# Patient Record
Sex: Female | Born: 1935 | Race: White | Hispanic: No | Marital: Married | Smoking: Former smoker
Health system: Southern US, Community
[De-identification: ages and names within clinical notes are randomized; demographics above are authoritative.]

## PROBLEM LIST (undated history)

## (undated) DIAGNOSIS — J45909 Unspecified asthma, uncomplicated: Secondary | ICD-10-CM

## (undated) DIAGNOSIS — C801 Malignant (primary) neoplasm, unspecified: Secondary | ICD-10-CM

## (undated) DIAGNOSIS — D649 Anemia, unspecified: Secondary | ICD-10-CM

## (undated) DIAGNOSIS — K8020 Calculus of gallbladder without cholecystitis without obstruction: Secondary | ICD-10-CM

## (undated) DIAGNOSIS — M1990 Unspecified osteoarthritis, unspecified site: Secondary | ICD-10-CM

## (undated) DIAGNOSIS — I10 Essential (primary) hypertension: Secondary | ICD-10-CM

## (undated) DIAGNOSIS — N183 Chronic kidney disease, stage 3 unspecified: Secondary | ICD-10-CM

## (undated) DIAGNOSIS — K7460 Unspecified cirrhosis of liver: Secondary | ICD-10-CM

## (undated) DIAGNOSIS — T8092XA Unspecified transfusion reaction, initial encounter: Secondary | ICD-10-CM

## (undated) DIAGNOSIS — K766 Portal hypertension: Secondary | ICD-10-CM

## (undated) DIAGNOSIS — F32A Depression, unspecified: Secondary | ICD-10-CM

## (undated) DIAGNOSIS — F329 Major depressive disorder, single episode, unspecified: Secondary | ICD-10-CM

## (undated) HISTORY — DX: Portal hypertension: K76.6

## (undated) HISTORY — DX: Anemia, unspecified: D64.9

## (undated) HISTORY — DX: Unspecified osteoarthritis, unspecified site: M19.90

## (undated) HISTORY — DX: Unspecified transfusion reaction, initial encounter: T80.92XA

## (undated) HISTORY — DX: Depression, unspecified: F32.A

## (undated) HISTORY — DX: Essential (primary) hypertension: I10

## (undated) HISTORY — DX: Major depressive disorder, single episode, unspecified: F32.9

## (undated) HISTORY — DX: Malignant (primary) neoplasm, unspecified: C80.1

## (undated) HISTORY — DX: Unspecified asthma, uncomplicated: J45.909

---

## 2004-07-27 ENCOUNTER — Ambulatory Visit: Payer: Self-pay | Admitting: Physician Assistant

## 2004-08-29 ENCOUNTER — Ambulatory Visit: Payer: Self-pay | Admitting: Physician Assistant

## 2004-09-19 ENCOUNTER — Ambulatory Visit: Payer: Self-pay | Admitting: Pain Medicine

## 2004-09-26 ENCOUNTER — Ambulatory Visit: Payer: Self-pay | Admitting: Pain Medicine

## 2004-10-12 ENCOUNTER — Ambulatory Visit: Payer: Self-pay | Admitting: Pain Medicine

## 2004-10-23 ENCOUNTER — Emergency Department: Payer: Self-pay | Admitting: General Practice

## 2004-10-31 ENCOUNTER — Ambulatory Visit: Payer: Self-pay | Admitting: Pain Medicine

## 2004-11-23 ENCOUNTER — Ambulatory Visit: Payer: Self-pay | Admitting: Pain Medicine

## 2004-12-27 ENCOUNTER — Ambulatory Visit: Payer: Self-pay | Admitting: Physician Assistant

## 2005-01-24 ENCOUNTER — Ambulatory Visit: Payer: Self-pay | Admitting: Physician Assistant

## 2005-03-13 ENCOUNTER — Ambulatory Visit: Payer: Self-pay | Admitting: Physician Assistant

## 2005-04-10 ENCOUNTER — Ambulatory Visit: Payer: Self-pay | Admitting: Physician Assistant

## 2005-04-17 ENCOUNTER — Ambulatory Visit: Payer: Self-pay | Admitting: Pain Medicine

## 2005-04-19 ENCOUNTER — Ambulatory Visit: Payer: Self-pay | Admitting: Pain Medicine

## 2005-05-10 ENCOUNTER — Ambulatory Visit: Payer: Self-pay | Admitting: Physician Assistant

## 2005-06-08 ENCOUNTER — Ambulatory Visit: Payer: Self-pay | Admitting: Physician Assistant

## 2005-07-05 ENCOUNTER — Ambulatory Visit: Payer: Self-pay | Admitting: Physician Assistant

## 2005-08-13 ENCOUNTER — Ambulatory Visit: Payer: Self-pay | Admitting: Physician Assistant

## 2005-09-10 ENCOUNTER — Ambulatory Visit: Payer: Self-pay | Admitting: Physician Assistant

## 2005-10-12 ENCOUNTER — Ambulatory Visit: Payer: Self-pay | Admitting: Physician Assistant

## 2005-11-06 ENCOUNTER — Ambulatory Visit: Payer: Self-pay | Admitting: Physician Assistant

## 2005-12-10 ENCOUNTER — Ambulatory Visit: Payer: Self-pay | Admitting: Physician Assistant

## 2006-01-08 ENCOUNTER — Ambulatory Visit: Payer: Self-pay | Admitting: Physician Assistant

## 2006-02-05 ENCOUNTER — Ambulatory Visit: Payer: Self-pay | Admitting: Physician Assistant

## 2006-03-07 ENCOUNTER — Ambulatory Visit: Payer: Self-pay | Admitting: Physician Assistant

## 2006-04-04 ENCOUNTER — Ambulatory Visit: Payer: Self-pay | Admitting: Physician Assistant

## 2006-04-15 ENCOUNTER — Ambulatory Visit: Payer: Self-pay | Admitting: Pain Medicine

## 2006-05-07 ENCOUNTER — Ambulatory Visit: Payer: Self-pay | Admitting: Physician Assistant

## 2006-06-10 ENCOUNTER — Ambulatory Visit: Payer: Self-pay | Admitting: Physician Assistant

## 2006-07-11 ENCOUNTER — Ambulatory Visit: Payer: Self-pay | Admitting: Physician Assistant

## 2006-08-09 ENCOUNTER — Ambulatory Visit: Payer: Self-pay | Admitting: Physician Assistant

## 2006-09-10 ENCOUNTER — Ambulatory Visit: Payer: Self-pay | Admitting: Physician Assistant

## 2006-10-09 ENCOUNTER — Ambulatory Visit: Payer: Self-pay | Admitting: Physician Assistant

## 2006-10-10 ENCOUNTER — Ambulatory Visit: Payer: Self-pay | Admitting: Pain Medicine

## 2006-11-07 ENCOUNTER — Ambulatory Visit: Payer: Self-pay | Admitting: Physician Assistant

## 2006-12-09 ENCOUNTER — Ambulatory Visit: Payer: Self-pay | Admitting: Physician Assistant

## 2007-01-07 ENCOUNTER — Ambulatory Visit: Payer: Self-pay | Admitting: Physician Assistant

## 2007-02-05 ENCOUNTER — Ambulatory Visit: Payer: Self-pay | Admitting: Physician Assistant

## 2007-03-10 ENCOUNTER — Ambulatory Visit: Payer: Self-pay | Admitting: Physician Assistant

## 2007-04-10 ENCOUNTER — Ambulatory Visit: Payer: Self-pay | Admitting: Physician Assistant

## 2007-05-14 ENCOUNTER — Ambulatory Visit: Payer: Self-pay | Admitting: Physician Assistant

## 2007-06-12 ENCOUNTER — Ambulatory Visit: Payer: Self-pay | Admitting: Physician Assistant

## 2007-07-08 ENCOUNTER — Ambulatory Visit: Payer: Self-pay | Admitting: Physician Assistant

## 2010-01-11 ENCOUNTER — Ambulatory Visit: Payer: Self-pay | Admitting: Internal Medicine

## 2010-10-01 DIAGNOSIS — D649 Anemia, unspecified: Secondary | ICD-10-CM

## 2010-10-01 HISTORY — DX: Anemia, unspecified: D64.9

## 2010-10-01 HISTORY — PX: COLONOSCOPY: SHX174

## 2010-11-28 ENCOUNTER — Inpatient Hospital Stay: Payer: Self-pay | Admitting: Internal Medicine

## 2010-12-01 LAB — HM COLONOSCOPY

## 2010-12-05 LAB — PATHOLOGY REPORT

## 2011-01-23 ENCOUNTER — Ambulatory Visit: Payer: Self-pay | Admitting: Internal Medicine

## 2011-01-26 ENCOUNTER — Ambulatory Visit: Payer: Self-pay | Admitting: Internal Medicine

## 2011-05-28 ENCOUNTER — Telehealth: Payer: Self-pay | Admitting: Internal Medicine

## 2011-05-28 NOTE — Telephone Encounter (Signed)
Patient has cancelled several times. Britta Mccreedy (scheduler) with vein center wants to see if doctor would like her to be rescheduled. The patient's family has called and wants to have her seen.

## 2011-05-29 NOTE — Telephone Encounter (Signed)
Lets wait until she comes to see me,  i DON'T WANT HER TO KEEP WAISTING THEIR TIME

## 2011-05-29 NOTE — Telephone Encounter (Signed)
Barbara from [facility] called and stated patient has cancelled several times with them and they want to know if you want her to be rescheduled again.  Please advise

## 2011-05-30 NOTE — Telephone Encounter (Signed)
Notified [facility] wait until patient sees Dr. Darrick Huntsman, so that she is not waisting their time.

## 2011-06-05 ENCOUNTER — Other Ambulatory Visit: Payer: Self-pay | Admitting: Internal Medicine

## 2011-06-05 MED ORDER — ACETAMINOPHEN-CODEINE 300-30 MG PO TABS: 1.0000 | ORAL_TABLET | Freq: Two times a day (BID) | ORAL | Status: DC | PRN

## 2011-07-02 ENCOUNTER — Other Ambulatory Visit: Payer: Self-pay | Admitting: Internal Medicine

## 2011-07-02 MED ORDER — FERROUS SULFATE 324 (65 FE) MG PO TBEC: 1.0000 | DELAYED_RELEASE_TABLET | Freq: Two times a day (BID) | ORAL | Status: DC

## 2011-07-09 ENCOUNTER — Other Ambulatory Visit: Payer: Self-pay | Admitting: Internal Medicine

## 2011-07-10 MED ORDER — INSULIN DETEMIR 100 UNIT/ML ~~LOC~~ SOLN: 20.0000 [IU] | Freq: Every day | SUBCUTANEOUS | Status: DC

## 2011-07-13 ENCOUNTER — Telehealth: Payer: Self-pay | Admitting: Internal Medicine

## 2011-07-13 DIAGNOSIS — Z1239 Encounter for other screening for malignant neoplasm of breast: Secondary | ICD-10-CM

## 2011-07-13 NOTE — Telephone Encounter (Signed)
PT CALL SHE RECEIVED LETTER  SHE NEEDS ORDER FOR 6 MONTH FOLLOW UP MAMMOGRAM @ [facility]

## 2011-07-13 NOTE — Telephone Encounter (Signed)
Ordered in epic

## 2011-07-17 NOTE — Telephone Encounter (Signed)
Called pt told her that i was faxing order over and she could call and make appointment

## 2011-07-20 ENCOUNTER — Telehealth: Payer: Self-pay | Admitting: *Deleted

## 2011-07-20 DIAGNOSIS — R928 Other abnormal and inconclusive findings on diagnostic imaging of breast: Secondary | ICD-10-CM

## 2011-07-20 NOTE — Telephone Encounter (Signed)
New order needed, done.

## 2011-08-06 ENCOUNTER — Telehealth: Payer: Self-pay | Admitting: Internal Medicine

## 2011-08-06 NOTE — Telephone Encounter (Signed)
Order signed for [facility].

## 2011-08-06 NOTE — Telephone Encounter (Signed)
Order faxed.

## 2011-08-08 ENCOUNTER — Ambulatory Visit: Payer: Self-pay | Admitting: Internal Medicine

## 2011-08-09 ENCOUNTER — Telehealth: Payer: Self-pay | Admitting: Internal Medicine

## 2011-08-09 DIAGNOSIS — R928 Other abnormal and inconclusive findings on diagnostic imaging of breast: Secondary | ICD-10-CM

## 2011-08-09 NOTE — Telephone Encounter (Signed)
Please let Becky Roberts know that her mammogram shows a density in the right breast that needs to be biopsied.  I would like to get her scheduled ASAP with a surgeon.  Does she have a preference who I send her to? If not,  Please we will call the cancer Center first.

## 2011-08-09 NOTE — Telephone Encounter (Signed)
Notified patient of message, she does not have a preference so I will send the referral to [facility] to set patient up at the cancer center.

## 2011-08-10 ENCOUNTER — Telehealth: Payer: Self-pay | Admitting: Internal Medicine

## 2011-08-10 ENCOUNTER — Ambulatory Visit: Payer: Self-pay | Admitting: Oncology

## 2011-08-10 DIAGNOSIS — N6310 Unspecified lump in the right breast, unspecified quadrant: Secondary | ICD-10-CM

## 2011-08-10 NOTE — Telephone Encounter (Signed)
Yes  I will place a referral to [facility].  Please call their office. tanks

## 2011-08-10 NOTE — Telephone Encounter (Signed)
She called in and states that Dr. Nicholaus Corolla wants patient referred to a surgeon, for Right breast mass for biopsy the family chose [facility] [phone] either Dr. Lemar Livings or [facility]. She states that they called over to get the patient appointment however they request the referral comes from the PCP, since patient has medicaid.  Please advise, we may call   Marchelle Folks is the granddaughter [phone] with appointment.

## 2011-08-15 ENCOUNTER — Other Ambulatory Visit: Payer: Self-pay | Admitting: Internal Medicine

## 2011-08-15 MED ORDER — ERGOCALCIFEROL 1.25 MG (50000 UT) PO CAPS: 50000.0000 [IU] | ORAL_CAPSULE | ORAL | Status: DC

## 2011-08-15 NOTE — Telephone Encounter (Signed)
Becky Roberts,  Has this patient received an appt with Dr. Lemar Livings for her breast biopsy?  See below.

## 2011-08-16 NOTE — Telephone Encounter (Signed)
Patient has an appointment with Dr. Lemar Livings on Tuesday Nov. 27, 12 she needs to arrive at 10:45 for an 11:15 appointment, this was their soonest appointment due to holidays.  I gave all the information of appointment heir number and address. to son Scotty Mabe, and also advised him that he needed to call Medicaid and get [facility] off of his mothers card.

## 2011-09-01 ENCOUNTER — Ambulatory Visit: Payer: Self-pay | Admitting: Oncology

## 2011-12-04 ENCOUNTER — Other Ambulatory Visit: Payer: Self-pay | Admitting: Internal Medicine

## 2011-12-05 MED ORDER — GLIPIZIDE 10 MG PO TABS: 10.0000 mg | ORAL_TABLET | Freq: Two times a day (BID) | ORAL | Status: DC

## 2012-01-11 ENCOUNTER — Ambulatory Visit: Payer: Medicare Other | Admitting: Internal Medicine

## 2012-02-07 ENCOUNTER — Ambulatory Visit: Payer: Self-pay | Admitting: General Surgery

## 2012-02-11 ENCOUNTER — Other Ambulatory Visit: Payer: Self-pay | Admitting: Internal Medicine

## 2012-02-11 MED ORDER — "SYRINGE/NEEDLE (DISP) 25G X 5/8"" 3 ML MISC": Status: DC

## 2012-02-11 MED ORDER — ACETAMINOPHEN-CODEINE 300-30 MG PO TABS: 1.0000 | ORAL_TABLET | Freq: Two times a day (BID) | ORAL | Status: DC | PRN

## 2012-02-12 ENCOUNTER — Ambulatory Visit: Payer: Self-pay | Admitting: General Surgery

## 2012-03-05 ENCOUNTER — Encounter: Payer: Self-pay | Admitting: Internal Medicine

## 2012-03-05 DIAGNOSIS — C50919 Malignant neoplasm of unspecified site of unspecified female breast: Secondary | ICD-10-CM | POA: Insufficient documentation

## 2012-03-06 ENCOUNTER — Ambulatory Visit (INDEPENDENT_AMBULATORY_CARE_PROVIDER_SITE_OTHER): Payer: Medicare Other | Admitting: Internal Medicine

## 2012-03-06 ENCOUNTER — Telehealth: Payer: Self-pay | Admitting: Internal Medicine

## 2012-03-06 ENCOUNTER — Encounter: Payer: Self-pay | Admitting: Internal Medicine

## 2012-03-06 VITALS — BP 140/80 | HR 96 | Temp 98.0°F | Ht 62.5 in | Wt 183.8 lb

## 2012-03-06 DIAGNOSIS — R079 Chest pain, unspecified: Secondary | ICD-10-CM

## 2012-03-06 DIAGNOSIS — E1165 Type 2 diabetes mellitus with hyperglycemia: Secondary | ICD-10-CM

## 2012-03-06 DIAGNOSIS — D649 Anemia, unspecified: Secondary | ICD-10-CM

## 2012-03-06 DIAGNOSIS — E1129 Type 2 diabetes mellitus with other diabetic kidney complication: Secondary | ICD-10-CM

## 2012-03-06 DIAGNOSIS — N058 Unspecified nephritic syndrome with other morphologic changes: Secondary | ICD-10-CM

## 2012-03-06 DIAGNOSIS — R0609 Other forms of dyspnea: Secondary | ICD-10-CM | POA: Insufficient documentation

## 2012-03-06 DIAGNOSIS — E46 Unspecified protein-calorie malnutrition: Secondary | ICD-10-CM

## 2012-03-06 DIAGNOSIS — M6281 Muscle weakness (generalized): Secondary | ICD-10-CM | POA: Insufficient documentation

## 2012-03-06 DIAGNOSIS — R011 Cardiac murmur, unspecified: Secondary | ICD-10-CM

## 2012-03-06 DIAGNOSIS — R635 Abnormal weight gain: Secondary | ICD-10-CM

## 2012-03-06 DIAGNOSIS — IMO0001 Reserved for inherently not codable concepts without codable children: Secondary | ICD-10-CM

## 2012-03-06 DIAGNOSIS — C50919 Malignant neoplasm of unspecified site of unspecified female breast: Secondary | ICD-10-CM

## 2012-03-06 DIAGNOSIS — R0600 Dyspnea, unspecified: Secondary | ICD-10-CM

## 2012-03-06 DIAGNOSIS — R0989 Other specified symptoms and signs involving the circulatory and respiratory systems: Secondary | ICD-10-CM

## 2012-03-06 DIAGNOSIS — E1121 Type 2 diabetes mellitus with diabetic nephropathy: Secondary | ICD-10-CM | POA: Insufficient documentation

## 2012-03-06 LAB — CBC WITH DIFFERENTIAL/PLATELET
Basophils Relative: 0.7 % (ref 0.0–3.0)
Eosinophils Absolute: 0.1 10*3/uL (ref 0.0–0.7)
MCHC: 33.5 g/dL (ref 30.0–36.0)
MCV: 99.4 fl (ref 78.0–100.0)
Monocytes Absolute: 0.5 10*3/uL (ref 0.1–1.0)
Neutrophils Relative %: 56 % (ref 43.0–77.0)
Platelets: 140 10*3/uL — ABNORMAL LOW (ref 150.0–400.0)
RBC: 3.29 Mil/uL — ABNORMAL LOW (ref 3.87–5.11)

## 2012-03-06 LAB — FERRITIN: Ferritin: 36.4 ng/mL (ref 10.0–291.0)

## 2012-03-06 LAB — TSH: TSH: 2.46 u[IU]/mL (ref 0.35–5.50)

## 2012-03-06 LAB — BRAIN NATRIURETIC PEPTIDE: Pro B Natriuretic peptide (BNP): 30 pg/mL (ref 0.0–100.0)

## 2012-03-06 LAB — MICROALBUMIN / CREATININE URINE RATIO: Microalb, Ur: 1.2 mg/dL (ref 0.0–1.9)

## 2012-03-06 MED ORDER — ACETAMINOPHEN-CODEINE 300-30 MG PO TABS: 1.0000 | ORAL_TABLET | Freq: Three times a day (TID) | ORAL | Status: DC | PRN

## 2012-03-06 MED ORDER — "SYRINGE/NEEDLE (DISP) 25G X 5/8"" 3 ML MISC": Status: DC

## 2012-03-06 MED ORDER — "SYRINGE LUER LOCK 25G X 5/8"" 3 ML MISC": 1.0000 "application " | Status: DC

## 2012-03-06 NOTE — Progress Notes (Signed)
Patient ID: Becky Roberts, female   DOB: 05-05-1932, 78 y.o.   MRN: [id number]  Patient Active Problem List  Diagnoses  . Breast cancer  . Anemia  . Generalized muscle weakness  . Diabetes mellitus with nephropathy  . Chest pain on exertion  . Dyspnea on effort    Subjective:  CC:   Chief Complaint  Patient presents with  . Follow-up    HPI:   Becky Mansfieldis a 78 y.o. female who presents  For a preoperative evaluation.  She is 78 yr old female with a history  of diabetes mellitus, protein malnutrition, pernicious anemia, generalized anxiety disorder, tobacco abuse, Chronic kidney disease who has been lost to follow up for one year.  She has been recently diagnosed with breast cancer and presents for preoperative evaluation for breast conservation surgery which is planned for next week by Adela Glimpse. Her general health is poor.  I have only seen her 2 or three times. She was  Admitted to [facility] shortly after her initial visit from my office in May 2012 with symptomatic anemia of uncertain etiology.  B12 deficiency, protein malnutrition and chronic kidney disease secondary to diabetes were diagnosed, and she as offered short term rehab at discharge but refused. She has been in decline since hospitalization. She moved out of daughter's home last week and into her own trailer.  Diet has been poor for the past year since discharge, despite being malnourished and living with daughter, who does not cook,  Son has been giving her the B12 injections monthly.  She has had no follow up on diabetes, hyperlipidemia, anemia and CKD since May 2012.  She has had some SSCP which she has attributed to gas, which occurs both at rest and with ambulation, and reports dyspnea with exertion and trouble ambulating due to generalized weakness." Like I did when I had no blood. "    Checks blood sugar rarely ,  Sugars range from "149 to 2-- something", with occasional hypoglycemic episodes occurring when she  sleeps through the day and sometimes in the middle of the night.  Treats those episodes with  Pepsi.  Has transportation issues, doesnot hve a functioning car bc daughter wrecked her car.  uses walker, lives alone  children busy working.  Too exhausted to go to grocery store.  Had an adenosine myoview.at [facility] years ago which she apparently had a bad reaction to and was prescribed  Plavix bc she has an allergy to aspirin .She has a remote tobacco history in her 20's, but  none in years.  ,  Past Medical History  Diagnosis Date  . Asthma   . Arthritis   . Depression   . Diabetes mellitus   . Hypertension   . Blood transfusion reaction   . Anemia 2012    hgb 5.5, admitted,  workup multifactorial,  b12 deficiency    Past Surgical History  Procedure Date  . Breast biopsy          The following portions of the patient's history were reviewed and updated as appropriate: Allergies, current medications, and problem list.    Review of Systems:   12 Pt  review of systems was negative except those addressed in the HPI,     History   Social History  . Marital Status: Unknown    Spouse Name: N/A    Number of Children: N/A  . Years of Education: N/A   Occupational History  . Not on file.   Social History Main  Topics  . Smoking status: Former Games developer  . Smokeless tobacco: Not on file  . Alcohol Use: Not on file  . Drug Use: Not on file  . Sexually Active: Not on file   Other Topics Concern  . Not on file   Social History Narrative  . No narrative on file    Objective:  BP 140/80  Pulse 96  Temp(Src) 98 F (36.7 C) (Oral)  Ht 5' 2.5" (1.588 m)  Wt 183 lb 12 oz (83.348 kg)  BMI 33.07 kg/m2  SpO2 98%  General appearance: alert, cooperative and appears stated age Ears: normal TM's and external ear canals both ears Throat: lips, mucosa, and tongue normal; teeth and gums normal Neck: no adenopathy, no carotid bruit, supple, symmetrical, trachea midline and thyroid not  enlarged, symmetric, no tenderness/mass/nodules Back: symmetric, no curvature. ROM normal. No CVA tenderness. Lungs: clear to auscultation bilaterally Heart: regular rate and rhythm, S1, S2 normal, no murmur, click, rub or gallop Abdomen: soft, non-tender; bowel sounds normal; no masses,  no organomegaly Pulses: 2+ and symmetric Skin: Skin color, texture, turgor normal. No rashes or lesions Lymph nodes: Cervical, supraclavicular, and axillary nodes normal.  Assessment and Plan:  Breast cancer Right breast,  With breast conservation therapy preferred by patient.  Surgeyr tentatively planned for next week, but her preoperative evaluation raises many concerns about cardiomyopathy vs CAD.  Will try to get ECHO scheduled ASAP to further identify any issues.   Anemia multifactorial  with May 2012 admission for transfusion of 4 units and GI evaluation. 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  Serologic evaluation ruled out multiple myeloma.  Instrisic Factor ab negative and iron deficiency found.  Continue B12 injections and check iron stores.   Diabetes mellitus with nephropathy Urine microalbumin and hgba1c is due   Dyspnea on effort Given her hgb of 11.o, etiology includes CAD, cardiomyopathy, pulmonary hypertension, and severe deconditioning.  ECHO ordered,  EKG is sinus rhythm.  Generalized muscle weakness Checking ck,  Will add TSH if possible. Will need home PT to improve baseline.  She will need a multidicplinary approach to deliver optimal care. [facility] referral in process.  Chest pain on exertion Her EKG is normal appearing but she has multiple risk factors for CAD.  Will assess with ECHO for preoperative evaluation.     Updated Medication List Outpatient Encounter Prescriptions as of 03/06/2012  Medication Sig Dispense Refill  . Acetaminophen-Codeine (TYLENOL/CODEINE #3) 300-30 MG per tablet Take 1 tablet by mouth 3 (three) times daily as needed for pain.  90 tablet  3  .  clopidogrel (PLAVIX) 75 MG tablet Take 75 mg by mouth daily.      . cyanocobalamin (,VITAMIN B-12,) 1000 MCG/ML injection Inject 1,000 mcg into the muscle every 30 (thirty) days.      . diazepam (VALIUM) 5 MG tablet Take 5 mg by mouth 3 (three) times daily.      . ergocalciferol (VITAMIN D2) 50000 UNITS capsule Take 1 capsule (50,000 Units total) by mouth once a week.  4 capsule  5  . ferrous sulfate 324 (65 FE) MG TBEC Take 1 tablet by mouth 2 (two) times daily with a meal.  60 tablet  5  . glipiZIDE (GLUCOTROL) 10 MG tablet Take 1 tablet (10 mg total) by mouth 2 (two) times daily before a meal.  60 tablet  3  . insulin detemir (LEVEMIR) 100 UNIT/ML injection Inject 20 Units into the skin daily.  10 mL  3  .  lisinopril (PRINIVIL,ZESTRIL) 10 MG tablet Take 10 mg by mouth daily.      . metoCLOPramide (REGLAN) 10 MG tablet Take one tablet by mouth 30 minutes before meals      . omeprazole (PRILOSEC) 20 MG capsule Take 20 mg by mouth daily.      . simvastatin (ZOCOR) 40 MG tablet Take 40 mg by mouth daily.      . SYRINGE-NEEDLE, DISP, 3 ML (B-D SYRINGE/NEEDLE 3CC/25GX5/8) 25G X 5/8" 3 ML MISC Use once a month  50 each  5  . DISCONTD: Acetaminophen-Codeine (TYLENOL/CODEINE #3) 300-30 MG per tablet Take 1 tablet by mouth 2 (two) times daily as needed for pain.  60 tablet  3  . DISCONTD: SYRINGE-NEEDLE, DISP, 3 ML (B-D SYRINGE/NEEDLE 3CC/25GX5/8) 25G X 5/8" 3 ML MISC Use once a month  50 each  5  . Syringe/Needle, Disp, (SYRINGE LUER LOCK) 25G X 5/8" 3 ML MISC 1 application by Does not apply route every 30 (thirty) days.  50 each  0     Orders Placed This Encounter  Procedures  . TSH  . COMPLETE METABOLIC PANEL WITH GFR  . Iron and TIBC  . Ferritin  . Microalbumin / creatinine urine ratio  . CBC with Differential  . CK  . B Nat Peptide  . Magnesium  . Hemoglobin A1c  . Ambulatory referral to Home Health  . 2D Echocardiogram without contrast  . Electrocardiogram report    No Follow-up on  file.      '

## 2012-03-06 NOTE — Assessment & Plan Note (Signed)
Given her hgb of 11.o, etiology includes CAD, cardiomyopathy, pulmonary hypertension, and severe deconditioning.  ECHO ordered,  EKG is sinus rhythm.

## 2012-03-06 NOTE — Telephone Encounter (Signed)
Appointment for echo @ [facility]  Appointment @ 12:30 need to arrive @ 29 Called spoke with judy dr Darrick Huntsman wanted [place] dr to read and also wanted call report Pt aware of appointment

## 2012-03-06 NOTE — Assessment & Plan Note (Signed)
Checking ck,  Will add TSH if possible. Will need home PT to improve baseline.  She will need a multidicplinary approach to deliver optimal care. [facility] referral in process.

## 2012-03-06 NOTE — Patient Instructions (Signed)
I have scheduled you for an echocardiogram of your heart tomorrow June 7th, at the hospital.  [facility] and tell them you are there to get an ECHO of your heart.

## 2012-03-06 NOTE — Telephone Encounter (Signed)
FYI

## 2012-03-06 NOTE — Assessment & Plan Note (Signed)
Right breast,  With breast conservation therapy preferred by patient.  Surgeyr tentatively planned for next week, but her preoperative evaluation raises many concerns about cardiomyopathy vs CAD.  Will try to get ECHO scheduled ASAP to further identify any issues.

## 2012-03-06 NOTE — Assessment & Plan Note (Signed)
Her EKG is normal appearing but she has multiple risk factors for CAD.  Will assess with ECHO for preoperative evaluation.

## 2012-03-06 NOTE — Assessment & Plan Note (Signed)
Urine microalbumin and hgba1c is due

## 2012-03-06 NOTE — Assessment & Plan Note (Signed)
multifactorial  with May 2012 admission for transfusion of 4 units and GI evaluation. 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  Serologic evaluation ruled out multiple myeloma.  Instrisic Factor ab negative and iron deficiency found.  Continue B12 injections and check iron stores.

## 2012-03-07 ENCOUNTER — Other Ambulatory Visit: Payer: Self-pay | Admitting: Internal Medicine

## 2012-03-07 ENCOUNTER — Ambulatory Visit: Payer: Self-pay | Admitting: Internal Medicine

## 2012-03-07 DIAGNOSIS — I517 Cardiomegaly: Secondary | ICD-10-CM

## 2012-03-07 LAB — COMPLETE METABOLIC PANEL WITH GFR
BUN: 11 mg/dL (ref 6–23)
CO2: 25 mEq/L (ref 19–32)
Creat: 1.18 mg/dL — ABNORMAL HIGH (ref 0.50–1.10)
GFR, Est African American: 51 mL/min — ABNORMAL LOW
GFR, Est Non African American: 44 mL/min — ABNORMAL LOW
Glucose, Bld: 142 mg/dL — ABNORMAL HIGH (ref 70–99)
Total Bilirubin: 0.7 mg/dL (ref 0.3–1.2)

## 2012-03-07 LAB — IRON AND TIBC
Iron: 131 ug/dL (ref 42–145)
TIBC: 293 ug/dL (ref 250–470)
UIBC: 162 ug/dL (ref 125–400)

## 2012-03-08 ENCOUNTER — Encounter: Payer: Self-pay | Admitting: Internal Medicine

## 2012-03-08 MED ORDER — ERGOCALCIFEROL 1.25 MG (50000 UT) PO CAPS: 50000.0000 [IU] | ORAL_CAPSULE | ORAL | Status: DC

## 2012-03-10 ENCOUNTER — Telehealth: Payer: Self-pay | Admitting: Internal Medicine

## 2012-03-10 ENCOUNTER — Other Ambulatory Visit: Payer: Self-pay | Admitting: Internal Medicine

## 2012-03-10 MED ORDER — AMOXICILLIN-POT CLAVULANATE 875-125 MG PO TABS: 1.0000 | ORAL_TABLET | Freq: Two times a day (BID) | ORAL | Status: DC

## 2012-03-10 NOTE — Telephone Encounter (Signed)
Patients son notified.  Rx called in.  I also talked to patient and she is aware.

## 2012-03-10 NOTE — Telephone Encounter (Signed)
jher labs are heart ECHO were reviewed.  I will clear her for surgery.  I just found an e mial in her chart from June 8 from son reporting URI symptoms.  Not in my in box ??? If she is having fever or productive cough,  rx augmentin 875/125 one tablet twice daily with food x 7 days.  14 no refills.

## 2012-03-11 ENCOUNTER — Telehealth: Payer: Self-pay | Admitting: Internal Medicine

## 2012-03-11 DIAGNOSIS — R531 Weakness: Secondary | ICD-10-CM

## 2012-03-11 NOTE — Telephone Encounter (Signed)
Michelle from [facility] called in and states that you had sent them a referral for Physical Therapy she states that they don't offer PT and that we need to refer patient to home health.

## 2012-03-12 ENCOUNTER — Encounter: Payer: Self-pay | Admitting: Internal Medicine

## 2012-03-12 NOTE — Telephone Encounter (Signed)
Please call Becky Roberts for [name],  referral in epic

## 2012-03-13 NOTE — Telephone Encounter (Signed)
I have started the referral to [facility].  I will fax once the order has been signed.

## 2012-03-17 ENCOUNTER — Encounter: Payer: Self-pay | Admitting: Internal Medicine

## 2012-03-17 ENCOUNTER — Ambulatory Visit (INDEPENDENT_AMBULATORY_CARE_PROVIDER_SITE_OTHER): Payer: Medicare Other | Admitting: Internal Medicine

## 2012-03-17 VITALS — BP 142/66 | HR 93 | Temp 98.1°F | Resp 16 | Wt 182.5 lb

## 2012-03-17 DIAGNOSIS — J441 Chronic obstructive pulmonary disease with (acute) exacerbation: Secondary | ICD-10-CM

## 2012-03-17 DIAGNOSIS — M6281 Muscle weakness (generalized): Secondary | ICD-10-CM

## 2012-03-17 DIAGNOSIS — N058 Unspecified nephritic syndrome with other morphologic changes: Secondary | ICD-10-CM

## 2012-03-17 DIAGNOSIS — R0989 Other specified symptoms and signs involving the circulatory and respiratory systems: Secondary | ICD-10-CM

## 2012-03-17 DIAGNOSIS — E1121 Type 2 diabetes mellitus with diabetic nephropathy: Secondary | ICD-10-CM

## 2012-03-17 DIAGNOSIS — E1129 Type 2 diabetes mellitus with other diabetic kidney complication: Secondary | ICD-10-CM

## 2012-03-17 MED ORDER — GUAIFENESIN-CODEINE 100-10 MG/5ML PO SYRP: 5.0000 mL | ORAL_SOLUTION | Freq: Three times a day (TID) | ORAL | Status: DC | PRN

## 2012-03-17 MED ORDER — INSULIN DETEMIR 100 UNIT/ML ~~LOC~~ SOLN: 10.0000 [IU] | Freq: Every day | SUBCUTANEOUS | Status: DC

## 2012-03-17 NOTE — Patient Instructions (Signed)
Record your blood sugars twice daily using the booklet I gave you ,  And record what kind of food you eat for each meal.   Continue the glipizide twice daily before breakfast and dinner(evening meal)  Add 10 units of Levemir insulin daily,  In the evening  Take 2 puffs twice a day of the symbicort inhaler  to help your breathing   I will call in a medicine for your cough that has codeine in it.

## 2012-03-17 NOTE — Assessment & Plan Note (Addendum)
Adding levemir  10 units daily for uncontrolled.  Asked to Check sugars twice daily

## 2012-03-17 NOTE — Progress Notes (Signed)
Patient ID: Becky Roberts, female   DOB: Jul 09, 1932, 78 y.o.   MRN: [id number] Patient Active Problem List  Diagnosis  . Breast cancer  . Anemia  . Generalized muscle weakness  . Diabetes mellitus with nephropathy  . Chest pain on exertion  . Dyspnea on effort  . COPD exacerbation    Subjective:  CC:   Chief Complaint  Patient presents with  . Follow-up    HPI:   Becky Mansfieldis a 78 y.o. female who presents for one week follow up on diabetes,  Breast cancer,  Acute COPD exacerbation treated empirically after last visit.  Her breast conservation surgery has been postponed until July 2 due to her respiratory status.  She continues to have some wheezing at night and dry cough.  Her previous subjectively high fevers have now resolved.  Sugars have been labile and recent hgba1c was 8.6.      Past Medical History  Diagnosis Date  . Asthma   . Arthritis   . Depression   . Diabetes mellitus   . Hypertension   . Blood transfusion reaction   . Anemia 2012    hgb 5.5, admitted,  workup multifactorial,  b12 deficiency    Past Surgical History  Procedure Date  . Breast biopsy          The following portions of the patient's history were reviewed and updated as appropriate: Allergies, current medications, and problem list.    Review of Systems:   12 Pt  review of systems was negative except those addressed in the HPI,     History   Social History  . Marital Status: Unknown    Spouse Name: N/A    Number of Children: N/A  . Years of Education: N/A   Occupational History  . Not on file.   Social History Main Topics  . Smoking status: Former Games developer  . Smokeless tobacco: Never Used  . Alcohol Use: No  . Drug Use: No  . Sexually Active: Not on file   Other Topics Concern  . Not on file   Social History Narrative  . No narrative on file    Objective:  BP 142/66  Pulse 93  Temp 98.1 F (36.7 C) (Oral)  Resp 16  Wt 182 lb 8 oz (82.781 kg)   SpO2 97%  General appearance: alert, cooperative and appears stated age Ears: normal TM's and external ear canals both ears Throat: lips, mucosa, and tongue normal; teeth and gums normal Neck: no adenopathy, no carotid bruit, supple, symmetrical, trachea midline and thyroid not enlarged, symmetric, no tenderness/mass/nodules Back: symmetric, no curvature. ROM normal. No CVA tenderness. Lungs: clear to auscultation bilaterally Heart: regular rate and rhythm, S1, S2 normal, no murmur, click, rub or gallop Abdomen: soft, non-tender; bowel sounds normal; no masses,  no organomegaly Pulses: 2+ and symmetric Skin: Skin color, texture, turgor normal. No rashes or lesions Lymph nodes: Cervical, supraclavicular, and axillary nodes normal.  Assessment and Plan:  Diabetes mellitus with nephropathy Adding levemir  10 units daily for uncontrolled.  Asked to Check sugars twice daily   COPD exacerbation Resolving,.  S/p one week of augmentin .  Adding 2 weeks of symbicort and cheratussin   Dyspnea on effort multifactorial secondary to COPD, deconditioning.  No signs of heart failure and ECHO was essentially normal.  Home Health services for PT ordered.   Generalized muscle weakness She is tremulous and weak , cannot ambulate more thnan 20 ft without assistance,  Home PT ordered.  Updated Medication List Outpatient Encounter Prescriptions as of 03/17/2012  Medication Sig Dispense Refill  . Acetaminophen-Codeine (TYLENOL/CODEINE #3) 300-30 MG per tablet Take 1 tablet by mouth 3 (three) times daily as needed for pain.  90 tablet  3  . clopidogrel (PLAVIX) 75 MG tablet Take 75 mg by mouth daily.      . cyanocobalamin (,VITAMIN B-12,) 1000 MCG/ML injection Inject 1,000 mcg into the muscle every 30 (thirty) days.      . diazepam (VALIUM) 5 MG tablet Take 5 mg by mouth 3 (three) times daily.      . ergocalciferol (VITAMIN D2) 50000 UNITS capsule Take 1 capsule (50,000 Units total) by mouth once a  week.  4 capsule  5  . ferrous sulfate 324 (65 FE) MG TBEC Take 1 tablet by mouth 2 (two) times daily with a meal.  60 tablet  5  . glipiZIDE (GLUCOTROL) 10 MG tablet Take 1 tablet (10 mg total) by mouth 2 (two) times daily before a meal.  60 tablet  3  . lisinopril (PRINIVIL,ZESTRIL) 10 MG tablet Take 10 mg by mouth daily.      . metoCLOPramide (REGLAN) 10 MG tablet Take one tablet by mouth 30 minutes before meals      . omeprazole (PRILOSEC) 20 MG capsule Take 20 mg by mouth daily.      . simvastatin (ZOCOR) 40 MG tablet Take 40 mg by mouth daily.      . SYRINGE-NEEDLE, DISP, 3 ML (B-D SYRINGE/NEEDLE 3CC/25GX5/8) 25G X 5/8" 3 ML MISC Use once a month  50 each  5  . Syringe/Needle, Disp, (SYRINGE LUER LOCK) 25G X 5/8" 3 ML MISC 1 application by Does not apply route every 30 (thirty) days.  50 each  0  . DISCONTD: insulin detemir (LEVEMIR) 100 UNIT/ML injection Inject 20 Units into the skin daily.  10 mL  3  . guaiFENesin-codeine (ROBITUSSIN AC) 100-10 MG/5ML syrup Take 5 mLs by mouth 3 (three) times daily as needed for cough.  120 mL  0  . insulin detemir (LEVEMIR) 100 UNIT/ML injection Inject 10 Units into the skin at bedtime.  10 mL  12  . DISCONTD: amoxicillin-clavulanate (AUGMENTIN) 875-125 MG per tablet Take 1 tablet by mouth 2 (two) times daily.  14 tablet  0     No orders of the defined types were placed in this encounter.    Return in about 4 weeks (around 04/14/2012).

## 2012-03-18 ENCOUNTER — Ambulatory Visit: Payer: Self-pay | Admitting: General Surgery

## 2012-03-18 NOTE — Assessment & Plan Note (Signed)
Resolving,.  S/p one week of augmentin .  Adding 2 weeks of symbicort and cheratussin

## 2012-03-18 NOTE — Assessment & Plan Note (Signed)
She is tremulous and weak , cannot ambulate more thnan 20 ft without assistance,  Home PT ordered.

## 2012-03-18 NOTE — Assessment & Plan Note (Signed)
multifactorial secondary to COPD, deconditioning.  No signs of heart failure and ECHO was essentially normal.  Home Health services for PT ordered.

## 2012-03-24 ENCOUNTER — Other Ambulatory Visit: Payer: Self-pay | Admitting: Internal Medicine

## 2012-03-24 DIAGNOSIS — R011 Cardiac murmur, unspecified: Secondary | ICD-10-CM

## 2012-03-24 DIAGNOSIS — R0600 Dyspnea, unspecified: Secondary | ICD-10-CM

## 2012-03-25 ENCOUNTER — Encounter: Payer: Self-pay | Admitting: Internal Medicine

## 2012-03-25 ENCOUNTER — Other Ambulatory Visit: Payer: Self-pay | Admitting: *Deleted

## 2012-03-25 MED ORDER — BLOOD GLUCOSE METER KIT: PACK | Status: AC

## 2012-03-28 ENCOUNTER — Other Ambulatory Visit: Payer: Self-pay | Admitting: *Deleted

## 2012-03-28 MED ORDER — CYANOCOBALAMIN 1000 MCG/ML IJ SOLN: 1000.0000 ug | INTRAMUSCULAR | Status: DC

## 2012-03-31 DIAGNOSIS — C801 Malignant (primary) neoplasm, unspecified: Secondary | ICD-10-CM | POA: Insufficient documentation

## 2012-03-31 HISTORY — PX: BREAST BIOPSY: SHX20

## 2012-04-01 ENCOUNTER — Ambulatory Visit: Payer: Self-pay | Admitting: General Surgery

## 2012-04-01 DIAGNOSIS — C801 Malignant (primary) neoplasm, unspecified: Secondary | ICD-10-CM

## 2012-04-01 HISTORY — DX: Malignant (primary) neoplasm, unspecified: C80.1

## 2012-04-08 ENCOUNTER — Ambulatory Visit: Payer: Self-pay | Admitting: Radiation Oncology

## 2012-04-16 ENCOUNTER — Encounter: Payer: Self-pay | Admitting: Internal Medicine

## 2012-04-16 ENCOUNTER — Ambulatory Visit (INDEPENDENT_AMBULATORY_CARE_PROVIDER_SITE_OTHER): Payer: Medicare Other | Admitting: Internal Medicine

## 2012-04-16 VITALS — BP 128/78 | HR 94 | Temp 98.0°F | Wt 187.0 lb

## 2012-04-16 DIAGNOSIS — E1129 Type 2 diabetes mellitus with other diabetic kidney complication: Secondary | ICD-10-CM

## 2012-04-16 DIAGNOSIS — E669 Obesity, unspecified: Secondary | ICD-10-CM

## 2012-04-16 DIAGNOSIS — R531 Weakness: Secondary | ICD-10-CM

## 2012-04-16 DIAGNOSIS — J441 Chronic obstructive pulmonary disease with (acute) exacerbation: Secondary | ICD-10-CM

## 2012-04-16 DIAGNOSIS — H9110 Presbycusis, unspecified ear: Secondary | ICD-10-CM

## 2012-04-16 DIAGNOSIS — E119 Type 2 diabetes mellitus without complications: Secondary | ICD-10-CM

## 2012-04-16 DIAGNOSIS — E1121 Type 2 diabetes mellitus with diabetic nephropathy: Secondary | ICD-10-CM

## 2012-04-16 DIAGNOSIS — N058 Unspecified nephritic syndrome with other morphologic changes: Secondary | ICD-10-CM

## 2012-04-16 DIAGNOSIS — R5381 Other malaise: Secondary | ICD-10-CM

## 2012-04-16 DIAGNOSIS — E1165 Type 2 diabetes mellitus with hyperglycemia: Secondary | ICD-10-CM

## 2012-04-16 DIAGNOSIS — C801 Malignant (primary) neoplasm, unspecified: Secondary | ICD-10-CM

## 2012-04-16 NOTE — Patient Instructions (Addendum)
You need to take Dulcolax 10 mg every night for your constiption . Do not stop it unless it gives you diarrhea.   You need to resume your Insulin starting at 15 units daily  Starting today   Do not take the glipizide unless you are eating a full meal.   Meats,  Peanut butter, deviled eggs,  Vegetables ,  Cheese are all high in protein low in sugar.   Return in one month

## 2012-04-16 NOTE — Progress Notes (Signed)
Patient ID: Becky Roberts, female   DOB: Sep 13, 1932, 78 y.o.   MRN: [id number]   Patient Active Problem List  Diagnosis  . Breast cancer  . Anemia  . Generalized muscle weakness  . Diabetes mellitus with nephropathy  . Chest pain on exertion  . Dyspnea on effort  . COPD exacerbation  . Poorly controlled type 2 diabetes mellitus  . Depression  . Hypertension  . Arthritis  . Asthma  . Breast cancer  . Obesity (BMI 30-39.9)    Subjective:  CC:   Chief Complaint  Patient presents with  . Follow-up    1 month F/U, new meter to test CBG    HPI:   [place] a 78 y.o. female who presents for followup on diabetes, generalized witnessed, and recent diagnosis of breast cancer.  Patient underwent wide excision and sentinel node biopsy for right breast cancer in the upper outer quadrant in early July by Dr. Doristine Counter. Original surgery was postponed due to a COPD exacerbation. The path report was a 1.5 cm grade 2 invasive mammary cancer with clear margins and negative sentinel nodes,  ER positive PR negative HER 2Neu not overexpressing. She had surgical followup with Dr. Doristine Counter on July 9 and incision was healing well. She is scheduled to see Dr. Aggie Cosier, the radiation oncologist on July 18 as she has elected for breast conservation therapy.  She continues to report feeling very weak and tremulous. She has not had any falls at home or syncopal events. She is not resting well due to frequent urination. She is not following any low glycemic index diet and has had very labile blood sugars including one in the 400 range after drinking a Coke yesterday. She is reporting constipation secondary to the pain meds that she took for her postop pain. She has multiple rotting teeth in her mouth as she would like to have removed but is afraid to do so until after her treatment for breast cancer. She did not bring her log of blood sugars in with her.  Past Medical History  Diagnosis Date  . Blood  transfusion reaction   . Diabetes mellitus   . Depression   . Hypertension   . Anemia 2012    hgb 5.5, admitted,  workup multifactorial,  b12 deficiency  . Arthritis   . Asthma   . Breast cancer July 2013    ER + PR- [name]    Past Surgical History  Procedure Date  . Breast biopsy July 2013    [name]: wide excision          The following portions of the patient's history were reviewed and updated as appropriate: Allergies, current medications, and problem list.    Review of Systems:   1Positive for generalized weakness,  Poor appetite.  The remiander of a comprehensive   review of systems was negative except those addressed in the HPI,     History   Social History  . Marital Status: Unknown    Spouse Name: N/A    Number of Children: N/A  . Years of Education: N/A   Occupational History  . Not on file.   Social History Main Topics  . Smoking status: Former Games developer  . Smokeless tobacco: Never Used  . Alcohol Use: No  . Drug Use: No  . Sexually Active: Not on file   Other Topics Concern  . Not on file   Social History Narrative  . No narrative on file    Objective:  BP  128/78  Pulse 94  Temp 98 F (36.7 C) (Oral)  Wt 187 lb (84.823 kg)  SpO2 97%  General appearance: alert, cooperative and appears stated age Ears: normal TM's and external ear canals both ears Throat: lips, mucosa, and tongue normal; multiple missing and decayed teeth Neck: no adenopathy, no carotid bruit, supple, symmetrical, trachea midline and thyroid not enlarged, symmetric, no tenderness/mass/nodules Back: symmetric, no curvature. ROM normal. No CVA tenderness. Lungs: clear to auscultation bilaterally Breasts: well healed surgical incision right breast, no erythema Heart: regular rate and rhythm, S1, S2 normal, no murmur, click, rub or gallop Abdomen: soft, non-tender; bowel sounds normal; no masses,  no organomegaly Pulses: 2+ and symmetric Skin: Skin color, texture,  turgor normal. No rashes or lesions Lymph nodes: Cervical, supraclavicular, and axillary nodes normal.  Assessment and Plan:  COPD exacerbation Now resolved. Patient is not hypoxic and has a normal lung exam. Continue inhalers as previously  Poorly controlled type 2 diabetes mellitus Secondary to medication nonadherence. She has stopped the Levemir for unclear reasons. She understands she needs to resume this at 15 units daily. Her son will send weekly logs of her blood sugars we can make adjustments to the medications. Last A1c was 8.6 in June. She is on a statin as well as an ACE inhibitor. She has known neuropathy and does not walk barefoot. Foot exam was normal today except for need of podiatry to toenails.  Diabetes mellitus with nephropathy She has a mildly elevated creatinine with no signs of proteinuria. Creatinine is stable.  Breast cancer She has elected for breast conserving therapy and is scheduled to see Dr. Aggie Cosier to begin treatment July 18.  Obesity (BMI 30-39.9) I have addressed  BMI and recommended a low glycemic index diet utilizing smaller more frequent meals to increase metabolism. She is receiving home physical therapy currently to increase patient's endurance and balance.     Updated Medication List Outpatient Encounter Prescriptions as of 04/16/2012  Medication Sig Dispense Refill  . Acetaminophen-Codeine (TYLENOL/CODEINE #3) 300-30 MG per tablet Take 1 tablet by mouth 3 (three) times daily as needed for pain.  90 tablet  3  . Blood Glucose Monitoring Suppl (BLOOD GLUCOSE METER) kit Use as instructed  1 each  0  . clopidogrel (PLAVIX) 75 MG tablet Take 75 mg by mouth daily.      . cyanocobalamin (,VITAMIN B-12,) 1000 MCG/ML injection Inject 1 mL (1,000 mcg total) into the muscle every 30 (thirty) days.  1 mL  12  . diazepam (VALIUM) 5 MG tablet Take 5 mg by mouth 3 (three) times daily.      . ergocalciferol (VITAMIN D2) 50000 UNITS capsule Take 1 capsule (50,000  Units total) by mouth once a week.  4 capsule  5  . ferrous sulfate 324 (65 FE) MG TBEC Take 1 tablet by mouth 2 (two) times daily with a meal.  60 tablet  5  . glipiZIDE (GLUCOTROL) 10 MG tablet Take 1 tablet (10 mg total) by mouth 2 (two) times daily before a meal.  60 tablet  3  . insulin detemir (LEVEMIR) 100 UNIT/ML injection Inject 10 Units into the skin at bedtime.  10 mL  12  . lisinopril (PRINIVIL,ZESTRIL) 10 MG tablet Take 10 mg by mouth daily.      . metoCLOPramide (REGLAN) 10 MG tablet Take one tablet by mouth 30 minutes before meals      . omeprazole (PRILOSEC) 20 MG capsule Take 20 mg by mouth daily.      Marland Kitchen  simvastatin (ZOCOR) 40 MG tablet Take 40 mg by mouth daily.      . SYRINGE-NEEDLE, DISP, 3 ML (B-D SYRINGE/NEEDLE 3CC/25GX5/8) 25G X 5/8" 3 ML MISC Use once a month  50 each  5  . Syringe/Needle, Disp, (SYRINGE LUER LOCK) 25G X 5/8" 3 ML MISC 1 application by Does not apply route every 30 (thirty) days.  50 each  0     Orders Placed This Encounter  Procedures  . DME Hospital bed  . Ambulatory referral to Audiology    Return in about 1 month (around 05/17/2012).

## 2012-04-17 ENCOUNTER — Ambulatory Visit: Payer: Self-pay | Admitting: Radiation Oncology

## 2012-04-17 ENCOUNTER — Encounter: Payer: Self-pay | Admitting: Internal Medicine

## 2012-04-17 DIAGNOSIS — I10 Essential (primary) hypertension: Secondary | ICD-10-CM | POA: Insufficient documentation

## 2012-04-17 DIAGNOSIS — J449 Chronic obstructive pulmonary disease, unspecified: Secondary | ICD-10-CM | POA: Insufficient documentation

## 2012-04-17 DIAGNOSIS — E119 Type 2 diabetes mellitus without complications: Secondary | ICD-10-CM | POA: Insufficient documentation

## 2012-04-17 DIAGNOSIS — E669 Obesity, unspecified: Secondary | ICD-10-CM | POA: Insufficient documentation

## 2012-04-17 DIAGNOSIS — M1990 Unspecified osteoarthritis, unspecified site: Secondary | ICD-10-CM | POA: Insufficient documentation

## 2012-04-17 DIAGNOSIS — F329 Major depressive disorder, single episode, unspecified: Secondary | ICD-10-CM | POA: Insufficient documentation

## 2012-04-17 NOTE — Assessment & Plan Note (Signed)
I have addressed  BMI and recommended a low glycemic index diet utilizing smaller more frequent meals to increase metabolism. She is receiving home physical therapy currently to increase patient's endurance and balance.

## 2012-04-17 NOTE — Assessment & Plan Note (Signed)
She has elected for breast conserving therapy and is scheduled to see Dr. Aggie Cosier to begin treatment July 18.

## 2012-04-17 NOTE — Assessment & Plan Note (Signed)
Secondary to medication nonadherence. She has stopped the Levemir for unclear reasons. She understands she needs to resume this at 15 units daily. Her son will send weekly logs of her blood sugars we can make adjustments to the medications. Last A1c was 8.6 in June. She is on a statin as well as an ACE inhibitor. She has known neuropathy and does not walk barefoot. Foot exam was normal today except for need of podiatry to toenails.

## 2012-04-17 NOTE — Assessment & Plan Note (Signed)
Now resolved. Patient is not hypoxic and has a normal lung exam. Continue inhalers as previously

## 2012-04-17 NOTE — Assessment & Plan Note (Signed)
She has a mildly elevated creatinine with no signs of proteinuria. Creatinine is stable.

## 2012-04-22 ENCOUNTER — Other Ambulatory Visit: Payer: Self-pay | Admitting: Internal Medicine

## 2012-04-22 MED ORDER — SIMVASTATIN 40 MG PO TABS: 40.0000 mg | ORAL_TABLET | Freq: Every day | ORAL | Status: DC

## 2012-04-25 ENCOUNTER — Encounter: Payer: Self-pay | Admitting: Internal Medicine

## 2012-04-28 MED ORDER — INSULIN DETEMIR 100 UNIT/ML ~~LOC~~ SOLN: 10.0000 [IU] | Freq: Every day | SUBCUTANEOUS | Status: DC

## 2012-04-30 ENCOUNTER — Telehealth: Payer: Self-pay | Admitting: Internal Medicine

## 2012-04-30 DIAGNOSIS — N644 Mastodynia: Secondary | ICD-10-CM

## 2012-04-30 MED ORDER — HYDROCODONE-ACETAMINOPHEN 5-500 MG PO TABS: 1.0000 | ORAL_TABLET | Freq: Four times a day (QID) | ORAL | Status: DC | PRN

## 2012-05-01 ENCOUNTER — Ambulatory Visit: Payer: Self-pay | Admitting: Radiation Oncology

## 2012-05-08 ENCOUNTER — Emergency Department: Payer: Self-pay | Admitting: Emergency Medicine

## 2012-05-08 ENCOUNTER — Other Ambulatory Visit: Payer: Self-pay | Admitting: *Deleted

## 2012-05-08 LAB — BASIC METABOLIC PANEL
Anion Gap: 7 (ref 7–16)
BUN: 15 mg/dL (ref 7–18)
EGFR (African American): 51 — ABNORMAL LOW
EGFR (Non-African Amer.): 44 — ABNORMAL LOW
Glucose: 243 mg/dL — ABNORMAL HIGH (ref 65–99)
Osmolality: 296 (ref 275–301)
Potassium: 3.5 mmol/L (ref 3.5–5.1)
Sodium: 144 mmol/L (ref 136–145)

## 2012-05-08 LAB — CBC WITH DIFFERENTIAL/PLATELET
Basophil %: 1.3 %
Eosinophil #: 0.2 10*3/uL (ref 0.0–0.7)
Eosinophil %: 3.3 %
HCT: 36.1 % (ref 35.0–47.0)
HGB: 12.4 g/dL (ref 12.0–16.0)
MCH: 33.7 pg (ref 26.0–34.0)
MCHC: 34.2 g/dL (ref 32.0–36.0)
MCV: 99 fL (ref 80–100)
Monocyte #: 0.5 x10 3/mm (ref 0.2–0.9)
Monocyte %: 10.2 %
Neutrophil #: 2.6 10*3/uL (ref 1.4–6.5)
Neutrophil %: 51.2 %
RBC: 3.67 10*6/uL — ABNORMAL LOW (ref 3.80–5.20)
RDW: 13.1 % (ref 11.5–14.5)

## 2012-05-08 LAB — URINALYSIS, COMPLETE
Bilirubin,UR: NEGATIVE
Blood: NEGATIVE
Glucose,UR: NEGATIVE mg/dL (ref 0–75)
Ketone: NEGATIVE
Leukocyte Esterase: NEGATIVE
RBC,UR: <1 /HPF (ref 0–5)
Specific Gravity: 1.009 (ref 1.003–1.030)
Squamous Epithelial: 1
WBC UR: 2 /HPF (ref 0–5)

## 2012-05-08 MED ORDER — FUROSEMIDE 20 MG PO TABS: ORAL_TABLET | ORAL | Status: DC

## 2012-05-13 ENCOUNTER — Encounter: Payer: Self-pay | Admitting: Internal Medicine

## 2012-05-15 ENCOUNTER — Other Ambulatory Visit: Payer: Self-pay | Admitting: Internal Medicine

## 2012-05-15 MED ORDER — FUROSEMIDE 20 MG PO TABS: ORAL_TABLET | ORAL | Status: DC

## 2012-05-15 MED ORDER — OMEPRAZOLE 20 MG PO CPDR: 20.0000 mg | DELAYED_RELEASE_CAPSULE | Freq: Every day | ORAL | Status: DC

## 2012-05-19 ENCOUNTER — Encounter (INDEPENDENT_AMBULATORY_CARE_PROVIDER_SITE_OTHER): Payer: Medicare Other | Admitting: Internal Medicine

## 2012-05-19 ENCOUNTER — Encounter: Payer: Self-pay | Admitting: Internal Medicine

## 2012-05-19 ENCOUNTER — Ambulatory Visit (INDEPENDENT_AMBULATORY_CARE_PROVIDER_SITE_OTHER): Payer: Medicare Other | Admitting: Internal Medicine

## 2012-05-19 VITALS — BP 156/72 | HR 95 | Temp 98.0°F | Resp 16 | Wt 186.8 lb

## 2012-05-19 DIAGNOSIS — E1121 Type 2 diabetes mellitus with diabetic nephropathy: Secondary | ICD-10-CM

## 2012-05-19 DIAGNOSIS — E119 Type 2 diabetes mellitus without complications: Secondary | ICD-10-CM

## 2012-05-19 DIAGNOSIS — E1129 Type 2 diabetes mellitus with other diabetic kidney complication: Secondary | ICD-10-CM

## 2012-05-19 DIAGNOSIS — H579 Unspecified disorder of eye and adnexa: Secondary | ICD-10-CM

## 2012-05-19 DIAGNOSIS — E1165 Type 2 diabetes mellitus with hyperglycemia: Secondary | ICD-10-CM

## 2012-05-19 DIAGNOSIS — N058 Unspecified nephritic syndrome with other morphologic changes: Secondary | ICD-10-CM

## 2012-05-19 DIAGNOSIS — D649 Anemia, unspecified: Secondary | ICD-10-CM

## 2012-05-19 DIAGNOSIS — I10 Essential (primary) hypertension: Secondary | ICD-10-CM

## 2012-05-19 DIAGNOSIS — E1139 Type 2 diabetes mellitus with other diabetic ophthalmic complication: Secondary | ICD-10-CM

## 2012-05-19 DIAGNOSIS — C801 Malignant (primary) neoplasm, unspecified: Secondary | ICD-10-CM

## 2012-05-19 NOTE — Progress Notes (Signed)
Patient ID: Becky Roberts, female   DOB: [birth year], 78 y.o.   MRN: [id number]  Patient Active Problem List  Diagnosis  . Breast cancer  . Anemia  . Generalized muscle weakness  . Diabetes mellitus with nephropathy  . Chest pain on exertion  . Dyspnea on effort  . COPD exacerbation  . Poorly controlled type 2 diabetes mellitus  . Depression  . Hypertension  . Arthritis  . Asthma  . Breast cancer  . Obesity (BMI 30-39.9)    Subjective:  CC:   Chief Complaint  Patient presents with  . Follow-up    one month    HPI:   Becky Mansfieldis a 78 y.o. female who presents for follow up on uncontrolled diabetes .  Insulin was resumed in July for hgba1c of 8.6 in late june.  Has been takign it regularly but ran out of syringes  for 2 days and doubled her glipizide dose to 20 mg daily for those days. No recent lows. CBS have been 130 to 180.  She is receiving XRT for right sided breast CA s/p lumpectomy.  Was treated in ER Wednesday  for dehydration and weakness. Received a liter of fluids .  She is feeling better but still weak (which is chronic and preexistent to CA diagnosis).  She is elf administering her own insulin . NO fevers, chills,  Nausea, or diarrhea.   Past Medical History  Diagnosis Date  . Blood transfusion reaction   . Diabetes mellitus   . Depression   . Hypertension   . Anemia 2012    hgb 5.5, admitted,  workup multifactorial,  b12 deficiency  . Arthritis   . Asthma   . Breast cancer July 2013    ER + PR- Becky Roberts    Past Surgical History  Procedure Date  . Breast biopsy July 2013    [name]: wide excision          The following portions of the patient's history were reviewed and updated as appropriate: Allergies, current medications, and problem list.    Review of Systems:   A comprehensive ROS was done and positive for weakness and mild breast pain .   The rest was negative.   History   Social History  . Marital Status: Unknown   Spouse Name: N/A    Number of Children: N/A  . Years of Education: N/A   Occupational History  . Not on file.   Social History Main Topics  . Smoking status: Former Games developer  . Smokeless tobacco: Never Used  . Alcohol Use: No  . Drug Use: No  . Sexually Active: Not on file   Other Topics Concern  . Not on file   Social History Narrative  . No narrative on file    Objective:  BP 156/72  Pulse 95  Temp 98 F (36.7 C) (Oral)  Resp 16  Wt 186 lb 12 oz (84.709 kg)  SpO2 95%  General appearance: alert, cooperative and appears stated age Ears: normal TM's and external ear canals both ears Throat: lips, mucosa, and tongue normal; teeth and gums normal Neck: no adenopathy, no carotid bruit, supple, symmetrical, trachea midline and thyroid not enlarged, symmetric, no tenderness/mass/nodules Back: symmetric, no curvature. ROM normal. No CVA tenderness. Lungs: clear to auscultation bilaterally Heart: regular rate and rhythm, S1, S2 normal, no murmur, click, rub or gallop Abdomen: soft, non-tender; bowel sounds normal; no masses,  no organomegaly Pulses: 2+ and symmetric Skin: Skin color, texture, turgor normal. No rashes  or lesions Lymph nodes: Cervical, supraclavicular, and axillary nodes normal.  Assessment and Plan:  Diabetes mellitus with nephropathy Improving with use of basal insulin daily.  Son providing balanced diet for her and supervising use of insulin which she is now comfortable giving herself.  Repeat A1c due in September   Poorly controlled type 2 diabetes mellitus Improving with use of basal insulin daily.  Son providing balanced diet for her and supervising use of insulin which she is now comfortable giving herself.  Repeat A1c due in September   Hypertension Historically well controlled on current regimen. Today's elevation is unusual. Renal function stable, no changes today.  Breast cancer Treated with wide excision. BCT preferred by patient.  Tolerating  XRT>   Anemia Multifactorial with iron and b12 deficiencies noted during prior admission.  hgb was 11.0 in may.  Continue supplements,  Recheck next month.    Updated Medication List Outpatient Encounter Prescriptions as of 05/19/2012  Medication Sig Dispense Refill  . Blood Glucose Monitoring Suppl (BLOOD GLUCOSE METER) kit Use as instructed  1 each  0  . clopidogrel (PLAVIX) 75 MG tablet Take 75 mg by mouth daily.      . cyanocobalamin (,VITAMIN B-12,) 1000 MCG/ML injection Inject 1 mL (1,000 mcg total) into the muscle every 30 (thirty) days.  1 mL  12  . diazepam (VALIUM) 5 MG tablet Take 5 mg by mouth 3 (three) times daily.      . ergocalciferol (VITAMIN D2) 50000 UNITS capsule Take 1 capsule (50,000 Units total) by mouth once a week.  4 capsule  5  . ferrous sulfate 324 (65 FE) MG TBEC Take 1 tablet by mouth 2 (two) times daily with a meal.  60 tablet  5  . furosemide (LASIX) 20 MG tablet Take one by mouth every other day for swelling  30 tablet  3  . insulin detemir (LEVEMIR) 100 UNIT/ML injection Inject 10 Units into the skin at bedtime.  10 mL  6  . lisinopril (PRINIVIL,ZESTRIL) 10 MG tablet Take 10 mg by mouth daily.      . metoCLOPramide (REGLAN) 10 MG tablet Take one tablet by mouth 30 minutes before meals      . omeprazole (PRILOSEC) 20 MG capsule Take 1 capsule (20 mg total) by mouth daily.  30 capsule  3  . simvastatin (ZOCOR) 40 MG tablet Take 1 tablet (40 mg total) by mouth daily.  30 tablet  3  . SYRINGE-NEEDLE, DISP, 3 ML (B-D SYRINGE/NEEDLE 3CC/25GX5/8) 25G X 5/8" 3 ML MISC Use once a month  50 each  5  . Syringe/Needle, Disp, (SYRINGE LUER LOCK) 25G X 5/8" 3 ML MISC 1 application by Does not apply route every 30 (thirty) days.  50 each  0  . DISCONTD: glipiZIDE (GLUCOTROL) 10 MG tablet Take 1 tablet (10 mg total) by mouth 2 (two) times daily before a meal.  60 tablet  3  . DISCONTD: HYDROcodone-acetaminophen (VICODIN) 5-500 MG per tablet Take 1 tablet by mouth every 6 (six)  hours as needed for pain.  120 tablet  1     Orders Placed This Encounter  Procedures  . Lipid panel  . CBC with Differential  . Comprehensive metabolic panel  . Hemoglobin A1c  . Ambulatory referral to Ophthalmology    Return in about 1 month (around 06/19/2012).

## 2012-05-19 NOTE — Patient Instructions (Addendum)
Increase your insulin when your eating well to 15 units,  Continue  10 units on days when your appetite is poor   Drink G2 (gatorade without all the sugar) for rehydrate  We will get you set up for an eye exam.    Dannon light and fit greek yogurt   Has lots of protein and low in sugar  Remember that peanut butter and scrambled eggs have less sugar than oatmeal and cereal and biscuits   Drink buttermilk before bedtime to prevent morning low blood sugars

## 2012-05-20 ENCOUNTER — Encounter: Payer: Self-pay | Admitting: Internal Medicine

## 2012-05-20 ENCOUNTER — Other Ambulatory Visit: Payer: Self-pay | Admitting: *Deleted

## 2012-05-20 MED ORDER — GLIPIZIDE 10 MG PO TABS: 10.0000 mg | ORAL_TABLET | Freq: Two times a day (BID) | ORAL | Status: DC

## 2012-05-20 MED ORDER — "INSULIN SYRINGE-NEEDLE U-100 31G X 5/16"" 1 ML MISC": Status: DC

## 2012-05-20 NOTE — Assessment & Plan Note (Signed)
Improving with use of basal insulin daily.  Son providing balanced diet for her and supervising use of insulin which she is now comfortable giving herself.  Repeat A1c due in September

## 2012-05-20 NOTE — Assessment & Plan Note (Signed)
Multifactorial with iron and b12 deficiencies noted during prior admission.  hgb was 11.0 in may.  Continue supplements,  Recheck next month.

## 2012-05-20 NOTE — Assessment & Plan Note (Signed)
Treated with wide excision. BCT preferred by patient.  Tolerating XRT>

## 2012-05-20 NOTE — Assessment & Plan Note (Signed)
Historically well controlled on current regimen. Today's elevation is unusual. Renal function stable, no changes today.

## 2012-05-20 NOTE — Assessment & Plan Note (Signed)
Improving with use of basal insulin daily.  Son providing balanced diet for her and supervising use of insulin which she is now comfortable giving herself.  Repeat A1c due in September  

## 2012-05-26 ENCOUNTER — Other Ambulatory Visit: Payer: Self-pay | Admitting: Internal Medicine

## 2012-05-26 DIAGNOSIS — N644 Mastodynia: Secondary | ICD-10-CM

## 2012-05-26 NOTE — Telephone Encounter (Signed)
Pt son called  Clydene Pugh mcadams told him to call ms [place]  To get refill on diazepan out for 5 or 6 days vicidian out for 2 or 3 days  Pt is completely out of meds

## 2012-05-27 MED ORDER — DIAZEPAM 5 MG PO TABS: 5.0000 mg | ORAL_TABLET | Freq: Three times a day (TID) | ORAL | Status: DC

## 2012-05-27 MED ORDER — HYDROCODONE-ACETAMINOPHEN 5-500 MG PO TABS: 1.0000 | ORAL_TABLET | Freq: Four times a day (QID) | ORAL | Status: DC | PRN

## 2012-06-01 ENCOUNTER — Ambulatory Visit: Payer: Self-pay | Admitting: Radiation Oncology

## 2012-06-10 ENCOUNTER — Encounter: Payer: Self-pay | Admitting: Internal Medicine

## 2012-06-16 ENCOUNTER — Encounter: Payer: Self-pay | Admitting: Internal Medicine

## 2012-06-16 ENCOUNTER — Telehealth: Payer: Self-pay | Admitting: Internal Medicine

## 2012-06-16 ENCOUNTER — Ambulatory Visit: Payer: Medicare Other | Admitting: Internal Medicine

## 2012-06-16 ENCOUNTER — Inpatient Hospital Stay: Payer: Self-pay | Admitting: Internal Medicine

## 2012-06-16 LAB — URINALYSIS, COMPLETE
Bilirubin,UR: NEGATIVE
Blood: NEGATIVE
Glucose,UR: 150 mg/dL (ref 0–75)
Ketone: NEGATIVE
Nitrite: NEGATIVE
Ph: 5 (ref 4.5–8.0)
Specific Gravity: 1.027 (ref 1.003–1.030)
Squamous Epithelial: 2

## 2012-06-16 LAB — COMPREHENSIVE METABOLIC PANEL
Anion Gap: 11 (ref 7–16)
BUN: 16 mg/dL (ref 7–18)
Calcium, Total: 8.6 mg/dL (ref 8.5–10.1)
Chloride: 105 mmol/L (ref 98–107)
Co2: 24 mmol/L (ref 21–32)
EGFR (African American): 37 — ABNORMAL LOW
Glucose: 294 mg/dL — ABNORMAL HIGH (ref 65–99)
Osmolality: 291 (ref 275–301)
Potassium: 3.8 mmol/L (ref 3.5–5.1)
Sodium: 140 mmol/L (ref 136–145)
Total Protein: 6.8 g/dL (ref 6.4–8.2)

## 2012-06-16 LAB — CBC
HCT: 38.2 % (ref 35.0–47.0)
HGB: 13.3 g/dL (ref 12.0–16.0)
MCV: 98 fL (ref 80–100)
RDW: 12.9 % (ref 11.5–14.5)
WBC: 12.8 10*3/uL — ABNORMAL HIGH (ref 3.6–11.0)

## 2012-06-16 NOTE — Telephone Encounter (Signed)
Caller: Joe/Child; Patient Name: Becky Roberts, Becky Roberts; PCP: Duncan Dull (Adults only); [facility] Phone Number: [phone]; Reason for call: Cough/Congestion.  Onset 06/13/12. Vomiting and Fever with cough and cold symptoms.  Pt unable to keep any fluids or medications down. Fever 102.0 Orally and Blood Glucose 216 mg/dL 08/65/78.  Urgent symptoms positive per Diabetes:  Gastrointestinal Problems due to 'Vomiting multiple times or unable to keep fluids down for more than 2 hrs'   See ED immediately.  Instructed Joe to take pt to ED or UC. Did offer an appt as alternative if pt declined ED.  He said he would try to get her to ED.

## 2012-06-17 ENCOUNTER — Other Ambulatory Visit: Payer: Medicare Other

## 2012-06-17 LAB — CBC WITH DIFFERENTIAL/PLATELET
Basophil #: 0 10*3/uL (ref 0.0–0.1)
Eosinophil %: 0.2 %
HCT: 32.9 % — ABNORMAL LOW (ref 35.0–47.0)
Lymphocyte #: 1.4 10*3/uL (ref 1.0–3.6)
Lymphocyte %: 13.5 %
MCH: 33.7 pg (ref 26.0–34.0)
MCHC: 34.5 g/dL (ref 32.0–36.0)
MCV: 98 fL (ref 80–100)
Monocyte #: 1.1 x10 3/mm — ABNORMAL HIGH (ref 0.2–0.9)
Monocyte %: 10.1 %
Neutrophil %: 75.8 %
Platelet: 79 10*3/uL — ABNORMAL LOW (ref 150–440)
RBC: 3.38 10*6/uL — ABNORMAL LOW (ref 3.80–5.20)
RDW: 13 % (ref 11.5–14.5)
WBC: 10.5 10*3/uL (ref 3.6–11.0)

## 2012-06-17 LAB — COMPREHENSIVE METABOLIC PANEL
Albumin: 2.2 g/dL — ABNORMAL LOW (ref 3.4–5.0)
Alkaline Phosphatase: 106 U/L (ref 50–136)
Bilirubin,Total: 1.4 mg/dL — ABNORMAL HIGH (ref 0.2–1.0)
Co2: 25 mmol/L (ref 21–32)
Creatinine: 1.31 mg/dL — ABNORMAL HIGH (ref 0.60–1.30)
EGFR (African American): 45 — ABNORMAL LOW
EGFR (Non-African Amer.): 39 — ABNORMAL LOW
Glucose: 161 mg/dL — ABNORMAL HIGH (ref 65–99)
Osmolality: 291 (ref 275–301)
Potassium: 3.7 mmol/L (ref 3.5–5.1)
SGPT (ALT): 20 U/L (ref 12–78)
Sodium: 144 mmol/L (ref 136–145)

## 2012-06-19 ENCOUNTER — Ambulatory Visit: Payer: Self-pay | Admitting: Oncology

## 2012-06-20 LAB — CULTURE, BLOOD (SINGLE)

## 2012-06-22 LAB — CULTURE, BLOOD (SINGLE)

## 2012-06-23 ENCOUNTER — Ambulatory Visit: Payer: Medicare Other | Admitting: Internal Medicine

## 2012-06-23 DIAGNOSIS — Z0289 Encounter for other administrative examinations: Secondary | ICD-10-CM

## 2012-06-26 ENCOUNTER — Telehealth: Payer: Self-pay | Admitting: Internal Medicine

## 2012-06-26 NOTE — Telephone Encounter (Signed)
I gave Becky Roberts the nurse the [facility] a verbal order that it it ok to provide patient with other services that provide.

## 2012-06-26 NOTE — Telephone Encounter (Signed)
Caller: Lynn/Patient; Patient Name: Becky Roberts, Becky Roberts; PCP: Duncan Dull (Adults only); [facility] Phone Number: [phone] to reach home health physical therapist  or can call office at [phone] and they should be able to supply any information as well.  [facility] Physical Therapist is calling because patient was discharged from hospital and they have order for PT but she needs alot more services. Physical Therapist is currently at the home of another client and does not have time to hold. They are requesting Cardiopulmonary assessment, home health aide to assist with ADL's, Occupational Therapist, and Speech Therapist.

## 2012-06-30 ENCOUNTER — Telehealth: Payer: Self-pay | Admitting: Internal Medicine

## 2012-06-30 DIAGNOSIS — R2681 Unsteadiness on feet: Secondary | ICD-10-CM

## 2012-06-30 DIAGNOSIS — R531 Weakness: Secondary | ICD-10-CM

## 2012-06-30 NOTE — Telephone Encounter (Signed)
Caller: Nancy/Other; Patient Name: Becky Roberts, Becky Roberts; PCP: Duncan Dull (Adults only); [facility] Phone Number: [phone].  Caller requests order for shower transfer bench and medical social worker.  She is faxing request to office for equipment.   Note to office per PCP Calls, No Triage protocol.

## 2012-06-30 NOTE — Telephone Encounter (Signed)
I can send the order for a shower bench but I need more information about the medical social worker request.  What exactly does she need?

## 2012-07-01 ENCOUNTER — Other Ambulatory Visit: Payer: Self-pay | Admitting: Internal Medicine

## 2012-07-01 MED ORDER — ALBUTEROL SULFATE HFA 108 (90 BASE) MCG/ACT IN AERS: 2.0000 | INHALATION_SPRAY | Freq: Four times a day (QID) | RESPIRATORY_TRACT | Status: DC | PRN

## 2012-07-02 ENCOUNTER — Telehealth: Payer: Self-pay | Admitting: Internal Medicine

## 2012-07-02 NOTE — Telephone Encounter (Signed)
I think she would be a great referral for [facility], can you start the referral?

## 2012-07-02 NOTE — Telephone Encounter (Signed)
Per caller she is requesting an order to get a medical social worker to come out and evaluate and help patient get set up with some of the resources like reliable transportation for appts  and personal care services.  She said she is weak and is deconditioned and needs assistance with her care.

## 2012-07-04 ENCOUNTER — Encounter: Payer: Self-pay | Admitting: *Deleted

## 2012-07-04 ENCOUNTER — Telehealth: Payer: Self-pay | Admitting: *Deleted

## 2012-07-04 NOTE — Telephone Encounter (Signed)
I am referring patient to [facility] for all of her home health and nursing needs.  They will be contacted on Monday

## 2012-07-04 NOTE — Telephone Encounter (Signed)
Becky Roberts is requesting an order for a shower bench to send to [facility].

## 2012-07-09 ENCOUNTER — Encounter: Payer: Self-pay | Admitting: Internal Medicine

## 2012-07-09 ENCOUNTER — Ambulatory Visit (INDEPENDENT_AMBULATORY_CARE_PROVIDER_SITE_OTHER): Payer: Medicare Other | Admitting: Internal Medicine

## 2012-07-09 VITALS — BP 124/68 | HR 78 | Temp 98.6°F | Ht 62.0 in | Wt 187.5 lb

## 2012-07-09 DIAGNOSIS — N179 Acute kidney failure, unspecified: Secondary | ICD-10-CM

## 2012-07-09 DIAGNOSIS — R5381 Other malaise: Secondary | ICD-10-CM

## 2012-07-09 DIAGNOSIS — E119 Type 2 diabetes mellitus without complications: Secondary | ICD-10-CM

## 2012-07-09 DIAGNOSIS — R1310 Dysphagia, unspecified: Secondary | ICD-10-CM

## 2012-07-09 DIAGNOSIS — E1129 Type 2 diabetes mellitus with other diabetic kidney complication: Secondary | ICD-10-CM

## 2012-07-09 DIAGNOSIS — N058 Unspecified nephritic syndrome with other morphologic changes: Secondary | ICD-10-CM

## 2012-07-09 DIAGNOSIS — E1121 Type 2 diabetes mellitus with diabetic nephropathy: Secondary | ICD-10-CM

## 2012-07-09 DIAGNOSIS — R262 Difficulty in walking, not elsewhere classified: Secondary | ICD-10-CM

## 2012-07-09 DIAGNOSIS — Z23 Encounter for immunization: Secondary | ICD-10-CM

## 2012-07-09 DIAGNOSIS — R531 Weakness: Secondary | ICD-10-CM

## 2012-07-09 DIAGNOSIS — E1165 Type 2 diabetes mellitus with hyperglycemia: Secondary | ICD-10-CM

## 2012-07-09 DIAGNOSIS — D649 Anemia, unspecified: Secondary | ICD-10-CM

## 2012-07-09 DIAGNOSIS — M6281 Muscle weakness (generalized): Secondary | ICD-10-CM

## 2012-07-09 DIAGNOSIS — F329 Major depressive disorder, single episode, unspecified: Secondary | ICD-10-CM

## 2012-07-09 DIAGNOSIS — J690 Pneumonitis due to inhalation of food and vomit: Secondary | ICD-10-CM

## 2012-07-09 LAB — LIPID PANEL
Total CHOL/HDL Ratio: 4
Triglycerides: 164 mg/dL — ABNORMAL HIGH (ref 0.0–149.0)

## 2012-07-09 LAB — COMPREHENSIVE METABOLIC PANEL
BUN: 11 mg/dL (ref 6–23)
CO2: 26 mEq/L (ref 19–32)
Calcium: 8.9 mg/dL (ref 8.4–10.5)
Chloride: 110 mEq/L (ref 96–112)
Creatinine, Ser: 1.3 mg/dL — ABNORMAL HIGH (ref 0.4–1.2)
GFR: 40.6 mL/min — ABNORMAL LOW (ref >60.00)
Glucose, Bld: 201 mg/dL — ABNORMAL HIGH (ref 70–99)
Total Bilirubin: 0.8 mg/dL (ref 0.3–1.2)

## 2012-07-09 LAB — CBC WITH DIFFERENTIAL/PLATELET
Eosinophils Relative: 3.6 % (ref 0.0–5.0)
HCT: 36.9 % (ref 36.0–46.0)
Hemoglobin: 12 g/dL (ref 12.0–15.0)
Lymphs Abs: 1.2 10*3/uL (ref 0.7–4.0)
Monocytes Relative: 7.5 % (ref 3.0–12.0)
Neutro Abs: 3.2 10*3/uL (ref 1.4–7.7)
WBC: 5 10*3/uL (ref 4.5–10.5)

## 2012-07-09 LAB — HEMOGLOBIN A1C: Hgb A1c MFr Bld: 8.2 % — ABNORMAL HIGH (ref 4.6–6.5)

## 2012-07-09 MED ORDER — CYANOCOBALAMIN 1000 MCG/ML IJ SOLN: 1000.0000 ug | INTRAMUSCULAR | Status: AC

## 2012-07-09 MED ORDER — ONDANSETRON HCL 4 MG PO TABS: 4.0000 mg | ORAL_TABLET | Freq: Three times a day (TID) | ORAL | Status: DC | PRN

## 2012-07-09 NOTE — Patient Instructions (Addendum)
You got a flu shot today  We will call you with lab results or e mail them to [facility]

## 2012-07-09 NOTE — Progress Notes (Signed)
Patient ID: Becky Roberts, female   DOB: 01-08-1932, 78 y.o.   MRN: [id number]  Patient Active Problem List  Diagnosis  . Breast cancer  . Anemia  . Generalized muscle weakness  . Diabetes mellitus with nephropathy  . Poorly controlled type 2 diabetes mellitus  . Depression  . Hypertension  . Arthritis  . Asthma  . Breast cancer  . Obesity (BMI 30-39.9)    Subjective:  CC:   Chief Complaint  Patient presents with  . Follow-up    HPI:   Becky Mansfieldis a 78 y.o. female who presents Follow up on chronic conditions including recent hospitalization Sept 17th for pneumonia, recent diagnosis of breast cancer. anxiety,  Reflux and DM. Due for labs today.  reglan is helping her nausea and reduced appetite. Sugars running bettter lately,  Using carnation instant bfast drink for calori supplementation. No falls.  Had home health RN and  PT after discharge form [facility] which helped gain her strength back.    Past Medical History  Diagnosis Date  . Blood transfusion reaction   . Diabetes mellitus   . Depression   . Hypertension   . Anemia 2012    hgb 5.5, admitted,  workup multifactorial,  b12 deficiency  . Arthritis   . Asthma   . Breast cancer July 2013    ER + PR- [name]    Past Surgical History  Procedure Date  . Breast biopsy July 2013    [name]: wide excision     The following portions of the patient's history were reviewed and updated as appropriate: Allergies, current medications, and problem list.    Review of Systems:   A comprehensive ROS was done and positive for occasional nausea, anorexia and weakness.   The rest was negative.  History   Social History  . Marital Status: Unknown    Spouse Name: N/A    Number of Children: N/A  . Years of Education: N/A   Occupational History  . Not on file.   Social History Main Topics  . Smoking status: Former Games developer  . Smokeless tobacco: Never Used  . Alcohol Use: No  . Drug Use: No  . Sexually  Active: Not on file   Other Topics Concern  . Not on file   Social History Narrative   Becky Roberts     Objective:  BP 124/68  Pulse 78  Temp 98.6 F (37 C) (Oral)  Ht 5\' 2"  (1.575 m)  Wt 187 lb 8 oz (85.049 kg)  BMI 34.29 kg/m2  SpO2 95%  General appearance: alert, cooperative and appears stated age Ears: normal TM's and external ear canals both ears Throat: lips, mucosa, and tongue normal; teeth and gums normal Neck: no adenopathy, no carotid bruit, supple, symmetrical, trachea midline and thyroid not enlarged, symmetric, no tenderness/mass/nodules Back: symmetric, no curvature. ROM normal. No CVA tenderness. Lungs: clear to auscultation bilaterally Heart: regular rate and rhythm, S1, S2 normal, no murmur, click, rub or gallop Abdomen: soft, non-tender; bowel sounds normal; no masses,  no organomegaly Pulses: 2+ and symmetric Skin: Skin color, texture, turgor normal. No rashes or lesions Lymph nodes: Cervical, supraclavicular, and axillary nodes normal.  Assessment and Plan:  Diabetes mellitus with nephropathy Control has improved slightly be repeat Hgba1c. Renal function stable. Will continue to adjust insulin doses with biweekly reports of blood sugars   Generalized muscle weakness Secondary to deconditioning and protein malnutrition. Rx for shower bench requested .   Anemia Multifactorial,  resolved currently.  Continue  b12 injections and iron supplements.     Depression Improved with current treatment. Appetite improving.    Updated Medication List Outpatient Encounter Prescriptions as of 07/09/2012  Medication Sig Dispense Refill  . albuterol (PROVENTIL HFA;VENTOLIN HFA) 108 (90 BASE) MCG/ACT inhaler Inhale 2 puffs into the lungs every 6 (six) hours as needed for wheezing.  1 Inhaler  11  . Blood Glucose Monitoring Suppl (BLOOD GLUCOSE METER) kit Use as instructed  1 each  0  . cyanocobalamin (,VITAMIN B-12,) 1000 MCG/ML injection Inject 1 mL (1,000 mcg total)  into the muscle every 30 (thirty) days.  10 mL  12  . diazepam (VALIUM) 5 MG tablet Take 1 tablet (5 mg total) by mouth 3 (three) times daily.  90 tablet  2  . ergocalciferol (VITAMIN D2) 50000 UNITS capsule Take 1 capsule (50,000 Units total) by mouth once a week.  4 capsule  5  . ferrous sulfate 324 (65 FE) MG TBEC Take 1 tablet by mouth 2 (two) times daily with a meal.  60 tablet  5  . furosemide (LASIX) 20 MG tablet Take one by mouth every other day for swelling  30 tablet  3  . glipiZIDE (GLUCOTROL) 10 MG tablet Take 1 tablet (10 mg total) by mouth 2 (two) times daily before a meal.  60 tablet  3  . HYDROcodone-acetaminophen (VICODIN) 5-500 MG per tablet Take 1 tablet by mouth every 6 (six) hours as needed for pain.  120 tablet  3  . insulin detemir (LEVEMIR) 100 UNIT/ML injection Inject 10 Units into the skin at bedtime.  10 mL  6  . Insulin Syringe-Needle U-100 (SURE COMFORT INSULIN SYRINGE) 31G X 5/16" 1 ML MISC Test blood sugars two times daily.  100 each  6  . lisinopril (PRINIVIL,ZESTRIL) 10 MG tablet Take 10 mg by mouth daily.      Marland Kitchen omeprazole (PRILOSEC) 20 MG capsule Take 1 capsule (20 mg total) by mouth daily.  30 capsule  3  . ondansetron (ZOFRAN) 4 MG tablet Take 1 tablet (4 mg total) by mouth every 8 (eight) hours as needed.  90 tablet  3  . simvastatin (ZOCOR) 40 MG tablet Take 1 tablet (40 mg total) by mouth daily.  30 tablet  3  . SYRINGE-NEEDLE, DISP, 3 ML (B-D SYRINGE/NEEDLE 3CC/25GX5/8) 25G X 5/8" 3 ML MISC Use once a month  50 each  5  . Syringe/Needle, Disp, (SYRINGE LUER LOCK) 25G X 5/8" 3 ML MISC 1 application by Does not apply route every 30 (thirty) days.  50 each  0  . DISCONTD: cyanocobalamin (,VITAMIN B-12,) 1000 MCG/ML injection Inject 1 mL (1,000 mcg total) into the muscle every 30 (thirty) days.  1 mL  12  . DISCONTD: ondansetron (ZOFRAN) 4 MG tablet Take 4 mg by mouth every 8 (eight) hours as needed.      . clopidogrel (PLAVIX) 75 MG tablet Take 75 mg by mouth  daily.      . metoCLOPramide (REGLAN) 10 MG tablet Take one tablet by mouth 30 minutes before meals         Orders Placed This Encounter  Procedures  . Shower chair  . Flu vaccine greater than or equal to 3yo preservative free IM  . Comprehensive metabolic panel    Return in about 3 months (around 10/09/2012).

## 2012-07-10 ENCOUNTER — Encounter: Payer: Self-pay | Admitting: Internal Medicine

## 2012-07-10 NOTE — Assessment & Plan Note (Signed)
Improved with current treatment. Appetite improving.

## 2012-07-10 NOTE — Assessment & Plan Note (Addendum)
Multifactorial,  resolved currently.  Continue b12 injections and iron supplements.

## 2012-07-10 NOTE — Assessment & Plan Note (Signed)
Control has improved slightly be repeat Hgba1c. Renal function stable. Will continue to adjust insulin doses with biweekly reports of blood sugars

## 2012-07-10 NOTE — Telephone Encounter (Signed)
Spoke to patient she had a follow up visit 07/09/12. Her grandson is going to bring the form by today for the medical social worker.

## 2012-07-10 NOTE — Assessment & Plan Note (Signed)
Secondary to deconditioning and protein malnutrition. Rx for shower bench requested .

## 2012-07-11 ENCOUNTER — Telehealth: Payer: Self-pay | Admitting: Internal Medicine

## 2012-07-11 NOTE — Telephone Encounter (Signed)
please give verbal order  To Larita Fife at [facility] to continue home care for ms Becky Roberts. thanks

## 2012-07-11 NOTE — Telephone Encounter (Signed)
[facility] nurse Dian Queen) is calling. She needs one more visit with Pt and needs an order for that because Mrs. Hulgan couldn't been seen today. She was going to see her next week but needs an order.   Best number for Larita Fife the nurse cell phone [phone] or office  To give verbal order [phone]

## 2012-07-14 NOTE — Telephone Encounter (Signed)
Verbal order given to Tasha at [facility] as instructed.

## 2012-07-15 ENCOUNTER — Telehealth: Payer: Self-pay | Admitting: Internal Medicine

## 2012-07-15 NOTE — Telephone Encounter (Signed)
Becky Roberts from [facility] is needing an order to continue nursing and Child psychotherapist.

## 2012-07-15 NOTE — Telephone Encounter (Signed)
Please give them a verbal order to continue nursing and social work.

## 2012-07-17 ENCOUNTER — Telehealth: Payer: Self-pay | Admitting: Internal Medicine

## 2012-07-17 ENCOUNTER — Ambulatory Visit: Payer: Self-pay | Admitting: Radiation Oncology

## 2012-07-17 LAB — COMPREHENSIVE METABOLIC PANEL
Alkaline Phosphatase: 148 U/L — ABNORMAL HIGH (ref 50–136)
BUN: 10 mg/dL (ref 7–18)
Calcium, Total: 8.6 mg/dL (ref 8.5–10.1)
Co2: 23 mmol/L (ref 21–32)
Creatinine: 1.34 mg/dL — ABNORMAL HIGH (ref 0.60–1.30)
EGFR (Non-African Amer.): 38 — ABNORMAL LOW
Glucose: 270 mg/dL — ABNORMAL HIGH (ref 65–99)
SGOT(AST): 39 U/L — ABNORMAL HIGH (ref 15–37)
SGPT (ALT): 34 U/L (ref 12–78)

## 2012-07-17 LAB — CBC CANCER CENTER
Basophil #: 0.1 x10 3/mm (ref 0.0–0.1)
Eosinophil #: 0.1 x10 3/mm (ref 0.0–0.7)
Eosinophil %: 2.5 %
HCT: 36.9 % (ref 35.0–47.0)
Lymphocyte #: 1.2 x10 3/mm (ref 1.0–3.6)
Lymphocyte %: 20.8 %
MCHC: 32.7 g/dL (ref 32.0–36.0)
MCV: 101 fL — ABNORMAL HIGH (ref 80–100)
Monocyte #: 0.5 x10 3/mm (ref 0.2–0.9)
Monocyte %: 8.8 %
Neutrophil #: 3.7 x10 3/mm (ref 1.4–6.5)
Platelet: 92 x10 3/mm — ABNORMAL LOW (ref 150–440)
RBC: 3.67 10*6/uL — ABNORMAL LOW (ref 3.80–5.20)
RDW: 14 % (ref 11.5–14.5)
WBC: 5.6 x10 3/mm (ref 3.6–11.0)

## 2012-07-17 NOTE — Telephone Encounter (Signed)
Form faxed to [phone]

## 2012-07-17 NOTE — Telephone Encounter (Signed)
Pt son dropped off [facility] form to be signed.  In box

## 2012-07-21 ENCOUNTER — Telehealth: Payer: Self-pay | Admitting: Internal Medicine

## 2012-07-21 NOTE — Telephone Encounter (Signed)
Harriett Sine from [facility] is calling to get orders to extend Occupational Therapy for pt.

## 2012-07-21 NOTE — Telephone Encounter (Signed)
extension authorized

## 2012-07-21 NOTE — Telephone Encounter (Signed)
Left message on nancy vm to verbally extend the order for occupational therapy.

## 2012-07-30 ENCOUNTER — Telehealth: Payer: Self-pay | Admitting: Internal Medicine

## 2012-07-30 NOTE — Telephone Encounter (Signed)
Caller: /Patient; Patient Name: [place], [place]; PCP: Duncan Dull (Adults only); [facility] Phone Number: [phone].  Patient calling about urinary symptoms with left-sided flank pain.  Onset 07/28/12 of flank pain; has had increased urination and urgency for > 2 weeks.  Afebrile currently.  Per flank pain protocol, advised appt within 4 hours; no appts available within dispositioned time frame.  Info to office for staff review/appt management.  May reach family at [phone].

## 2012-07-30 NOTE — Telephone Encounter (Signed)
She can be worked in Advertising account executive for a 5 minute visit to check for UTI.  Have her submit a urine for UA when she gets here and I will review it and treat if a UTI is suggested.

## 2012-07-31 NOTE — Telephone Encounter (Signed)
Spoke to patient asked if she could come in 08/01/12 at 1:15.

## 2012-08-01 ENCOUNTER — Telehealth: Payer: Self-pay | Admitting: Internal Medicine

## 2012-08-01 ENCOUNTER — Ambulatory Visit: Payer: Self-pay | Admitting: Radiation Oncology

## 2012-08-01 NOTE — Telephone Encounter (Signed)
Pt called and she was supposed to be worked in today at [facility] for a short 5 minute visit with Dr. Darrick Huntsman for a UA per Dr. Darrick Huntsman but her son didn't show up to her house on time and she says since it was so late and she lives so far that she was going to get her son to take her to a urgent care.

## 2012-08-07 ENCOUNTER — Other Ambulatory Visit: Payer: Self-pay | Admitting: Internal Medicine

## 2012-08-07 MED ORDER — LISINOPRIL 10 MG PO TABS: 20.0000 mg | ORAL_TABLET | Freq: Every day | ORAL | Status: DC

## 2012-08-11 ENCOUNTER — Telehealth: Payer: Self-pay | Admitting: Internal Medicine

## 2012-08-11 NOTE — Telephone Encounter (Signed)
Becky Roberts would like advice on exercise perimeters because she has some high blood pressure.  Her reading at rest was 178/70 and after performing a ADL it was 178/80.  She is not having any symptoms, no chest pain, shortness of breath, headache etc

## 2012-08-12 MED ORDER — LISINOPRIL 40 MG PO TABS: 40.0000 mg | ORAL_TABLET | Freq: Every day | ORAL | Status: DC

## 2012-08-12 NOTE — Telephone Encounter (Signed)
Patient notified of instructions on exercise and made aware that Lisinopril has been sent to her pharmacy.

## 2012-08-12 NOTE — Telephone Encounter (Signed)
Ok to exercise unless she is feeling dizzy or headache or bp > 180/ 90.    Please tell patient to increase her lisinopril to 40 mg daily using the pills she has and i will sen new rx to pharmacy

## 2012-08-14 ENCOUNTER — Telehealth: Payer: Self-pay | Admitting: Internal Medicine

## 2012-08-14 NOTE — Telephone Encounter (Signed)
Caller: Melinda/Patient; Patient Name: Becky Roberts, Becky Roberts; PCP: Duncan Dull (Adults only); [facility] Call[facility] Phone Number: [phone] Melinda-nurse with [facility] calling to verify Lisinopril dosage, states 40 mg was sent to the pharmacy and was recently changed to 20 mg. transferred to the office.

## 2012-08-15 ENCOUNTER — Other Ambulatory Visit: Payer: Self-pay

## 2012-08-15 NOTE — Telephone Encounter (Signed)
Yes the dose was changed to 40 mg. This is due to reports that I have systolic being in the 180s.

## 2012-08-15 NOTE — Telephone Encounter (Signed)
I gave Becky Roberts the message as instructed and she stated that she just wanted to make sure you wanted the 40 mg of Lisinopril because she went from 10 mg and she stated that was a big increase.

## 2012-09-01 ENCOUNTER — Encounter: Payer: Self-pay | Admitting: Internal Medicine

## 2012-09-04 ENCOUNTER — Other Ambulatory Visit: Payer: Self-pay | Admitting: Internal Medicine

## 2012-09-04 MED ORDER — INSULIN DETEMIR 100 UNIT/ML ~~LOC~~ SOLN: 15.0000 [IU] | Freq: Every day | SUBCUTANEOUS | Status: DC

## 2012-09-09 DIAGNOSIS — R262 Difficulty in walking, not elsewhere classified: Secondary | ICD-10-CM

## 2012-09-09 DIAGNOSIS — E119 Type 2 diabetes mellitus without complications: Secondary | ICD-10-CM

## 2012-09-09 DIAGNOSIS — J690 Pneumonitis due to inhalation of food and vomit: Secondary | ICD-10-CM

## 2012-09-09 DIAGNOSIS — R1310 Dysphagia, unspecified: Secondary | ICD-10-CM

## 2012-09-11 ENCOUNTER — Other Ambulatory Visit: Payer: Self-pay

## 2012-09-11 DIAGNOSIS — N644 Mastodynia: Secondary | ICD-10-CM

## 2012-09-11 MED ORDER — SIMVASTATIN 40 MG PO TABS: 40.0000 mg | ORAL_TABLET | Freq: Every day | ORAL | Status: DC

## 2012-09-11 MED ORDER — ERGOCALCIFEROL 1.25 MG (50000 UT) PO CAPS: 50000.0000 [IU] | ORAL_CAPSULE | ORAL | Status: DC

## 2012-09-11 NOTE — Telephone Encounter (Signed)
Refill request for Acetaminophen /Codeine . Ok to refill?

## 2012-09-11 NOTE — Telephone Encounter (Signed)
Refill request for Vitamin D and Simvastatin 40 mg sent electronic to [facility].

## 2012-09-12 ENCOUNTER — Other Ambulatory Visit: Payer: Self-pay

## 2012-09-12 MED ORDER — HYDROCODONE-ACETAMINOPHEN 5-325 MG PO TABS: 1.0000 | ORAL_TABLET | Freq: Four times a day (QID) | ORAL | Status: DC | PRN

## 2012-09-12 MED ORDER — "SYRINGE/NEEDLE (DISP) 25G X 5/8"" 3 ML MISC": Status: DC

## 2012-09-12 NOTE — Telephone Encounter (Signed)
Syringe needle 3 ml sent electronic to [facility]

## 2012-09-12 NOTE — Telephone Encounter (Signed)
Hydrocodone Acetaminophen 5-535 mg # 90 0 R called in to [facility]

## 2012-09-12 NOTE — Telephone Encounter (Signed)
Refill changed to 5/325 dosing.

## 2012-09-13 ENCOUNTER — Encounter: Payer: Self-pay | Admitting: Internal Medicine

## 2012-09-15 ENCOUNTER — Other Ambulatory Visit: Payer: Self-pay | Admitting: Internal Medicine

## 2012-09-15 NOTE — Telephone Encounter (Signed)
Refill - Pharmacy - [facility] Fax #[phone] Drug Name-BD Ultra -Fine Nano  Directions- Take as directed Quantity- 30

## 2012-09-15 NOTE — Telephone Encounter (Signed)
Spoke to patient she stated that she did not request anything to be filled.

## 2012-09-16 ENCOUNTER — Other Ambulatory Visit: Payer: Self-pay | Admitting: Internal Medicine

## 2012-09-16 ENCOUNTER — Telehealth: Payer: Self-pay | Admitting: Internal Medicine

## 2012-09-16 MED ORDER — "SYRINGE/NEEDLE (DISP) 25G X 5/8"" 3 ML MISC": Status: DC

## 2012-09-16 NOTE — Telephone Encounter (Signed)
Drug Name- BD Ultra- Fine Nano    Directions-As Directed  Quantity- 30

## 2012-09-16 NOTE — Telephone Encounter (Signed)
Prescription instructions needing to be changed on patients BD Ultra-Fine Nano.   Drug Name- BD Ultra Fine Nano  Directions- Every Day  Quantity-30

## 2012-09-16 NOTE — Telephone Encounter (Signed)
Directions was changed to once a day.

## 2012-09-16 NOTE — Telephone Encounter (Signed)
Insulin syringe was sent to [facility] on 09/12/12 as well as hydrocodone

## 2012-09-18 ENCOUNTER — Encounter: Payer: Self-pay | Admitting: Internal Medicine

## 2012-09-18 ENCOUNTER — Other Ambulatory Visit: Payer: Self-pay | Admitting: Internal Medicine

## 2012-09-18 ENCOUNTER — Other Ambulatory Visit: Payer: Self-pay

## 2012-09-18 DIAGNOSIS — E1121 Type 2 diabetes mellitus with diabetic nephropathy: Secondary | ICD-10-CM

## 2012-09-18 MED ORDER — INSULIN ASPART PROT & ASPART (70-30 MIX) 100 UNIT/ML ~~LOC~~ SUSP: 15.0000 [IU] | Freq: Every day | SUBCUTANEOUS | Status: DC

## 2012-09-18 NOTE — Assessment & Plan Note (Signed)
Insulin changed from lantus to 70/30 novolog 15 units pre dinner  For persistently elevated evening blood sugars and normal fasting bllod sugars

## 2012-09-19 MED ORDER — AMOXICILLIN-POT CLAVULANATE 875-125 MG PO TABS: 1.0000 | ORAL_TABLET | Freq: Two times a day (BID) | ORAL | Status: DC

## 2012-09-20 ENCOUNTER — Encounter: Payer: Self-pay | Admitting: Internal Medicine

## 2012-09-22 ENCOUNTER — Encounter: Payer: Self-pay | Admitting: Internal Medicine

## 2012-09-23 ENCOUNTER — Other Ambulatory Visit: Payer: Self-pay | Admitting: General Practice

## 2012-09-23 ENCOUNTER — Encounter: Payer: Self-pay | Admitting: Internal Medicine

## 2012-09-23 MED ORDER — GLIPIZIDE 10 MG PO TABS: 10.0000 mg | ORAL_TABLET | Freq: Two times a day (BID) | ORAL | Status: DC

## 2012-09-23 NOTE — Telephone Encounter (Signed)
Med filled.  

## 2012-09-25 ENCOUNTER — Encounter: Payer: Self-pay | Admitting: Internal Medicine

## 2012-09-25 ENCOUNTER — Other Ambulatory Visit: Payer: Self-pay | Admitting: Internal Medicine

## 2012-09-30 ENCOUNTER — Encounter: Payer: Self-pay | Admitting: Internal Medicine

## 2012-09-30 ENCOUNTER — Other Ambulatory Visit: Payer: Self-pay | Admitting: Internal Medicine

## 2012-10-02 ENCOUNTER — Other Ambulatory Visit: Payer: Self-pay | Admitting: Internal Medicine

## 2012-10-02 MED ORDER — HYDROCODONE-ACETAMINOPHEN 5-325 MG PO TABS: 1.0000 | ORAL_TABLET | Freq: Four times a day (QID) | ORAL | Status: DC | PRN

## 2012-10-03 ENCOUNTER — Other Ambulatory Visit: Payer: Self-pay | Admitting: Internal Medicine

## 2012-10-03 NOTE — Telephone Encounter (Signed)
Omeprazole DR 20 mg CER   Take 1 capsule each day  # 30

## 2012-10-06 MED ORDER — OMEPRAZOLE 20 MG PO CPDR: 20.0000 mg | DELAYED_RELEASE_CAPSULE | Freq: Every day | ORAL | Status: DC

## 2012-10-13 ENCOUNTER — Telehealth: Payer: Self-pay | Admitting: *Deleted

## 2012-10-13 ENCOUNTER — Ambulatory Visit (INDEPENDENT_AMBULATORY_CARE_PROVIDER_SITE_OTHER): Payer: Medicare HMO | Admitting: Internal Medicine

## 2012-10-13 ENCOUNTER — Encounter: Payer: Self-pay | Admitting: Internal Medicine

## 2012-10-13 VITALS — BP 140/72 | HR 91 | Temp 97.9°F | Resp 17 | Wt 183.5 lb

## 2012-10-13 DIAGNOSIS — E1121 Type 2 diabetes mellitus with diabetic nephropathy: Secondary | ICD-10-CM

## 2012-10-13 DIAGNOSIS — N390 Urinary tract infection, site not specified: Secondary | ICD-10-CM

## 2012-10-13 DIAGNOSIS — I10 Essential (primary) hypertension: Secondary | ICD-10-CM

## 2012-10-13 DIAGNOSIS — E1129 Type 2 diabetes mellitus with other diabetic kidney complication: Secondary | ICD-10-CM

## 2012-10-13 DIAGNOSIS — N058 Unspecified nephritic syndrome with other morphologic changes: Secondary | ICD-10-CM

## 2012-10-13 DIAGNOSIS — R32 Unspecified urinary incontinence: Secondary | ICD-10-CM

## 2012-10-13 DIAGNOSIS — C801 Malignant (primary) neoplasm, unspecified: Secondary | ICD-10-CM

## 2012-10-13 DIAGNOSIS — E119 Type 2 diabetes mellitus without complications: Secondary | ICD-10-CM

## 2012-10-13 DIAGNOSIS — E559 Vitamin D deficiency, unspecified: Secondary | ICD-10-CM

## 2012-10-13 LAB — POCT URINALYSIS DIPSTICK
Blood, UA: NEGATIVE
Nitrite, UA: NEGATIVE
Urobilinogen, UA: 0.2
pH, UA: 5.5

## 2012-10-13 NOTE — Telephone Encounter (Signed)
Would you like a urine culture done   

## 2012-10-13 NOTE — Addendum Note (Signed)
Addended by: Sherlene Shams on: 10/13/2012 05:23 PM   Modules accepted: Orders

## 2012-10-13 NOTE — Telephone Encounter (Signed)
Yes, thanks,  i added

## 2012-10-13 NOTE — Assessment & Plan Note (Signed)
resected with breast conserving therapy .  Now on exemestane

## 2012-10-13 NOTE — Progress Notes (Signed)
Patient ID: Becky Roberts, female   DOB: 1932-05-20, 79 y.o.   MRN: [id number]   Patient Active Problem List  Diagnosis  . Breast cancer  . Anemia  . Generalized muscle weakness  . Diabetes mellitus with nephropathy  . Poorly controlled type 2 diabetes mellitus  . Depression  . Hypertension  . Arthritis  . Asthma  . Breast cancer  . Obesity (BMI 30-39.9)    Subjective:  CC:   Chief Complaint  Patient presents with  . Follow-up    HPI:   [place] Mansfieldis a 79 y.o. female who presents for diabetes follow up   Sugars have been elevated to 300 for the past week  So she increased her insulin by 2 units to currently 22 units before supper .  She denies any lows,  Appetite is good . She has lost several lbs. .    Past Medical History  Diagnosis Date  . Blood transfusion reaction   . Diabetes mellitus   . Depression   . Hypertension   . Anemia 2012    hgb 5.5, admitted,  workup multifactorial,  b12 deficiency  . Arthritis   . Asthma   . Breast cancer July 2013    ER + PR- [name]    Past Surgical History  Procedure Date  . Breast biopsy July 2013    [name]: wide excision          The following portions of the patient's history were reviewed and updated as appropriate: Allergies, current medications, and problem list.    Review of Systems:  Patient denies headache, fevers, malaise, unintentional weight loss, skin rash, eye pain, sinus congestion and sinus pain, sore throat, dysphagia,  hemoptysis , cough, dyspnea, wheezing, chest pain, palpitations, orthopnea, edema, abdominal pain, nausea, melena, diarrhea, constipation, flank pain, dysuria, hematuria, urinary  Frequency, nocturia, numbness, tingling, seizures,  Focal weakness, Loss of consciousness,  Tremor, insomnia, depression, anxiety, and suicidal ideation.       History   Social History  . Marital Status: Unknown    Spouse Name: N/A    Number of Children: N/A  . Years of Education: N/A     Occupational History  . Not on file.   Social History Main Topics  . Smoking status: Former Games developer  . Smokeless tobacco: Never Used  . Alcohol Use: No  . Drug Use: No  . Sexually Active: Not on file   Other Topics Concern  . Not on file   Social History Narrative   Son [name]     Objective:  BP 140/72  Pulse 91  Temp 97.9 F (36.6 C) (Oral)  Resp 17  Wt 183 lb 8 oz (83.235 kg)  SpO2 96%  General appearance: alert, cooperative and appears stated age Ears: normal TM's and external ear canals both ears Throat: lips, mucosa, and tongue normal; teeth and gums normal Neck: no adenopathy, no carotid bruit, supple, symmetrical, trachea midline and thyroid not enlarged, symmetric, no tenderness/mass/nodules Back: symmetric, no curvature. ROM normal. No CVA tenderness. Lungs: clear to auscultation bilaterally Heart: regular rate and rhythm, S1, S2 normal, no murmur, click, rub or gallop Abdomen: soft, non-tender; bowel sounds normal; no masses,  no organomegaly Pulses: 2+ and symmetric Skin: Skin color, texture, turgor normal. No rashes or lesions Lymph nodes: Cervical, supraclavicular, and axillary nodes normal.  Assessment and Plan:  Breast cancer resected with breast conserving therapy .  Now on exemestane  Diabetes mellitus with nephropathy Starting mornign dose of insulin, 5 units of  70/30.  Stopping glipizide  Hypertension Well controlled on current medications.  No changes today.   Updated Medication List Outpatient Encounter Prescriptions as of 10/13/2012  Medication Sig Dispense Refill  . albuterol (PROVENTIL HFA;VENTOLIN HFA) 108 (90 BASE) MCG/ACT inhaler Inhale 2 puffs into the lungs every 6 (six) hours as needed for wheezing.  1 Inhaler  11  . Blood Glucose Monitoring Suppl (BLOOD GLUCOSE METER) kit Use as instructed  1 each  0  . clopidogrel (PLAVIX) 75 MG tablet Take 75 mg by mouth daily.      . cyanocobalamin (,VITAMIN B-12,) 1000 MCG/ML injection Inject  1 mL (1,000 mcg total) into the muscle every 30 (thirty) days.  10 mL  12  . diazepam (VALIUM) 5 MG tablet Take 1 tablet (5 mg total) by mouth 3 (three) times daily.  90 tablet  2  . ergocalciferol (VITAMIN D2) 50000 UNITS capsule Take 1 capsule (50,000 Units total) by mouth once a week.  4 capsule  5  . furosemide (LASIX) 20 MG tablet Take one by mouth every other day for swelling  30 tablet  3  . glipiZIDE (GLUCOTROL) 10 MG tablet Take 1 tablet (10 mg total) by mouth 2 (two) times daily before a meal.  60 tablet  3  . HYDROcodone-acetaminophen (NORCO/VICODIN) 5-325 MG per tablet Take 1 tablet by mouth every 6 (six) hours as needed for pain.  120 tablet  3  . insulin aspart protamine-insulin aspart (NOVOLOG MIX 70/30) (70-30) 100 UNIT/ML injection Inject 15 Units into the skin daily with supper.  10 mL  12  . Insulin Syringe-Needle U-100 (SURE COMFORT INSULIN SYRINGE) 31G X 5/16" 1 ML MISC Test blood sugars two times daily.  100 each  6  . lisinopril (PRINIVIL,ZESTRIL) 40 MG tablet Take 1 tablet (40 mg total) by mouth daily.  90 tablet  3  . metoCLOPramide (REGLAN) 10 MG tablet Take one tablet by mouth 30 minutes before meals      . omeprazole (PRILOSEC) 20 MG capsule Take 1 capsule (20 mg total) by mouth daily.  30 capsule  3  . ondansetron (ZOFRAN) 4 MG tablet Take 1 tablet (4 mg total) by mouth every 8 (eight) hours as needed.  90 tablet  3  . simvastatin (ZOCOR) 40 MG tablet Take 1 tablet (40 mg total) by mouth daily.  30 tablet  3  . SYRINGE-NEEDLE, DISP, 3 ML (B-D SYRINGE/NEEDLE 3CC/25GX5/8) 25G X 5/8" 3 ML MISC Use once a day  50 each  5  . Syringe/Needle, Disp, (SYRINGE LUER LOCK) 25G X 5/8" 3 ML MISC 1 application by Does not apply route every 30 (thirty) days.  50 each  0  . amoxicillin-clavulanate (AUGMENTIN) 875-125 MG per tablet Take 1 tablet by mouth 2 (two) times daily.  14 tablet  0     Orders Placed This Encounter  Procedures  . Hemoglobin A1c  . Vitamin D 25 hydroxy  .  Microalbumin / creatinine urine ratio  . Comprehensive metabolic panel  . POCT Urinalysis Dipstick    No Follow-up on file.

## 2012-10-13 NOTE — Assessment & Plan Note (Signed)
Well controlled on current medications.  No changes today. 

## 2012-10-13 NOTE — Patient Instructions (Addendum)
Start taking 5 units of 70/30 insulin before breakfast.  You can continue to check sugar before breakfast and dinner   You can stop the glipizide since we are using insulin twice daily now  You can send me yor blood sugars once a week and I will adjust your doses.   Continue to use zofran and metoclopramide as needed for nausea.

## 2012-10-13 NOTE — Assessment & Plan Note (Signed)
Starting mornign dose of insulin, 5 units of 70/30.  Stopping glipizide

## 2012-10-14 ENCOUNTER — Other Ambulatory Visit: Payer: Self-pay | Admitting: *Deleted

## 2012-10-14 DIAGNOSIS — N390 Urinary tract infection, site not specified: Secondary | ICD-10-CM

## 2012-10-14 LAB — COMPREHENSIVE METABOLIC PANEL
ALT: 38 U/L — ABNORMAL HIGH (ref 0–35)
AST: 37 U/L (ref 0–37)
Chloride: 107 mEq/L (ref 96–112)
Creatinine, Ser: 1.2 mg/dL (ref 0.4–1.2)
Total Bilirubin: 0.9 mg/dL (ref 0.3–1.2)

## 2012-10-14 LAB — MICROALBUMIN / CREATININE URINE RATIO
Creatinine,U: 177.2 mg/dL
Microalb Creat Ratio: 0.3 mg/g (ref 0.0–30.0)

## 2012-10-14 LAB — HEMOGLOBIN A1C: Hgb A1c MFr Bld: 8.9 % — ABNORMAL HIGH (ref 4.6–6.5)

## 2012-10-14 LAB — VITAMIN D 25 HYDROXY (VIT D DEFICIENCY, FRACTURES): Vit D, 25-Hydroxy: 54 ng/mL (ref 30–89)

## 2012-10-15 ENCOUNTER — Encounter: Payer: Self-pay | Admitting: Internal Medicine

## 2012-10-22 ENCOUNTER — Other Ambulatory Visit: Payer: Self-pay | Admitting: Internal Medicine

## 2012-10-22 MED ORDER — INSULIN ASPART PROT & ASPART (70-30 MIX) 100 UNIT/ML ~~LOC~~ SUSP: 30.0000 [IU] | Freq: Every day | SUBCUTANEOUS | Status: DC

## 2012-10-24 ENCOUNTER — Encounter: Payer: Self-pay | Admitting: Internal Medicine

## 2012-10-25 ENCOUNTER — Encounter: Payer: Self-pay | Admitting: Internal Medicine

## 2012-10-28 ENCOUNTER — Encounter: Payer: Self-pay | Admitting: Internal Medicine

## 2012-10-29 ENCOUNTER — Encounter: Payer: Self-pay | Admitting: Internal Medicine

## 2012-10-29 ENCOUNTER — Emergency Department: Payer: Self-pay

## 2012-10-29 LAB — BASIC METABOLIC PANEL
Anion Gap: 10 (ref 7–16)
Calcium, Total: 8.7 mg/dL (ref 8.5–10.1)
Chloride: 106 mmol/L (ref 98–107)
Creatinine: 1.31 mg/dL — ABNORMAL HIGH (ref 0.60–1.30)
EGFR (Non-African Amer.): 39 — ABNORMAL LOW
Osmolality: 298 (ref 275–301)
Potassium: 4.2 mmol/L (ref 3.5–5.1)
Sodium: 138 mmol/L (ref 136–145)

## 2012-10-29 LAB — CBC
MCH: 33.2 pg (ref 26.0–34.0)
MCV: 99 fL (ref 80–100)
Platelet: 92 10*3/uL — ABNORMAL LOW (ref 150–440)
RBC: 3.76 10*6/uL — ABNORMAL LOW (ref 3.80–5.20)
RDW: 13 % (ref 11.5–14.5)

## 2012-10-31 ENCOUNTER — Encounter: Payer: Self-pay | Admitting: Internal Medicine

## 2012-10-31 ENCOUNTER — Other Ambulatory Visit: Payer: Self-pay | Admitting: *Deleted

## 2012-11-01 ENCOUNTER — Ambulatory Visit: Payer: Self-pay | Admitting: Hematology and Oncology

## 2012-11-01 DIAGNOSIS — K766 Portal hypertension: Secondary | ICD-10-CM

## 2012-11-01 HISTORY — DX: Portal hypertension: K76.6

## 2012-11-04 ENCOUNTER — Ambulatory Visit (INDEPENDENT_AMBULATORY_CARE_PROVIDER_SITE_OTHER): Payer: Medicare HMO | Admitting: Internal Medicine

## 2012-11-04 ENCOUNTER — Encounter: Payer: Self-pay | Admitting: Internal Medicine

## 2012-11-04 VITALS — BP 140/72 | HR 85 | Temp 98.5°F | Ht 62.0 in | Wt 171.0 lb

## 2012-11-04 DIAGNOSIS — E1129 Type 2 diabetes mellitus with other diabetic kidney complication: Secondary | ICD-10-CM

## 2012-11-04 DIAGNOSIS — C801 Malignant (primary) neoplasm, unspecified: Secondary | ICD-10-CM

## 2012-11-04 DIAGNOSIS — N058 Unspecified nephritic syndrome with other morphologic changes: Secondary | ICD-10-CM

## 2012-11-04 DIAGNOSIS — E119 Type 2 diabetes mellitus without complications: Secondary | ICD-10-CM

## 2012-11-04 DIAGNOSIS — C50919 Malignant neoplasm of unspecified site of unspecified female breast: Secondary | ICD-10-CM

## 2012-11-04 DIAGNOSIS — E669 Obesity, unspecified: Secondary | ICD-10-CM

## 2012-11-04 DIAGNOSIS — Z853 Personal history of malignant neoplasm of breast: Secondary | ICD-10-CM

## 2012-11-04 DIAGNOSIS — E1121 Type 2 diabetes mellitus with diabetic nephropathy: Secondary | ICD-10-CM

## 2012-11-04 DIAGNOSIS — R1031 Right lower quadrant pain: Secondary | ICD-10-CM

## 2012-11-04 LAB — POCT URINALYSIS DIPSTICK
Bilirubin, UA: NEGATIVE
Glucose, UA: >=1000
Nitrite, UA: NEGATIVE
Urobilinogen, UA: 0.2
pH, UA: 5.5

## 2012-11-04 NOTE — Patient Instructions (Addendum)
Increase the morning insulin dose of 70/30 to 36 units  And the evening dose of 70/30 units to 32 units  Continue the levemir at 18 units.    Every 3 days we will increase the 70/30 if needed by MyChart  I am ordering a CT of your abdomen and pelvis to investigate your right lower quadrant pain

## 2012-11-04 NOTE — Progress Notes (Signed)
Patient ID: Becky Roberts, female   DOB: 1932/07/11, 79 y.o.   MRN: [id number]   Patient Active Problem List  Diagnosis  . Breast cancer  . Anemia  . Generalized muscle weakness  . 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 Poorly controlled type 2 diabetes mellitus  . Depression  . Hypertension  . Arthritis  . Asthma  . Breast cancer  . Obesity (BMI 30-39.9)  . RLQ abdominal pain    Subjective:  CC:   No chief complaint on file.   HPI:   Becky Mansfieldis a 79 y.o. female who presents Followup for diabetes mellitus with recent development of Uncontrolled blood sugars. She was treated in the emergency room for severe hypoglycemia with blood sugars in the 500 range. She was given a liter of IV fluids and given a dose of regular insulin and sent home. No further workup was done for cause. She is currently taking 2 doses of 7030 insulin along with Levemir. currently taking 70/30 bid  30 units and 26 units.  And levemir 20 units .  Blood sugars are still above 200 most of the time. She is complaining of right lower quadrant pain which is persistent deep and aching and not affected by movement or defecation. The pain has been present for several weeks. She's accompanied by her son Becky Roberts. She is very weak on her feet today and requires assistance with standing and climbing up onto the exam table.    Past Medical History  Diagnosis Date  . Blood transfusion reaction   . Diabetes mellitus   . Depression   . Hypertension   . Anemia 2012    hgb 5.5, admitted,  workup multifactorial,  b12 deficiency  . Arthritis   . Asthma   . Breast cancer July 2013    ER + PR- Becky Roberts    Past Surgical History  Procedure Date  . Breast biopsy July 2013    Becky Roberts: wide excision          The following portions of the patient's history were reviewed and updated as appropriate: Allergies, current medications, and problem list.    Review of Systems:   Patient  denies headache, fevers, malaise, unintentional weight loss, skin rash, eye pain, sinus congestion and sinus pain, sore throat, dysphagia,  hemoptysis , cough, dyspnea, wheezing, chest pain, palpitations, orthopnea, edema, abdominal pain, nausea, melena, diarrhea, constipation, flank pain, dysuria, hematuria, urinary  Frequency, nocturia, numbness, tingling, seizures,  Focal weakness, Loss of consciousness,  Tremor, insomnia, depression, anxiety, and suicidal ideation.         History   Social History  . Marital Status: Unknown    Spouse Name: N/A    Number of Children: N/A  . Years of Education: N/A   Occupational History  . Not on file.   Social History Main Topics  . Smoking status: Former Games developer  . Smokeless tobacco: Never Used  . Alcohol Use: No  . Drug Use: No  . Sexually Active: Not on file   Other Topics Concern  . Not on file   Social History Narrative   Son Becky Roberts     Objective:  BP 140/72  Pulse 85  Temp 98.5 F (36.9 C) (Oral)  Ht 5\' 2"  (1.575 m)  Wt 171 lb (77.565 kg)  BMI 31.28 kg/m2  SpO2 94%  General appearance: alert, cooperative and appears stated age Ears: normal TM's and external ear canals both ears Throat: lips, mucosa, and tongue normal; teeth and gums  normal Neck: no adenopathy, no carotid bruit, supple, symmetrical, trachea midline and thyroid not enlarged, symmetric, no tenderness/mass/nodules Back: symmetric, no curvature. ROM normal. No CVA tenderness. Lungs: clear to auscultation bilaterally Heart: regular rate and rhythm, S1, S2 normal, no murmur, click, rub or gallop Abdomen: soft, tender in RUQ and RLQ.  Liver edge is palpable. bowel sounds normal; no masses,  no organomegaly Pulses: 2+ and symmetric Skin: Skin color, texture, turgor normal. No rashes or lesions Lymph nodes: Cervical, supraclavicular, and axillary nodes normal.  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 Becky Roberts will use the EMT MyChart to followup with subsequent blood sugar readings. Her creatinine is stable.  RLQ abdominal pain Accompanied by unintentional weight loss and uncontrolled diabetes in the setting of breast cancer . Etiology is unclear. She has no history of small bowel obstruction or kidney stones. Urinalysis was negative for hematuria. Given her history of breast cancer I am concerned about metastatic spread to small bowel and liver and have ordered a CT of the abdomen and pelvis.  Obesity (BMI 30-39.9) She has lost weight involuntarily but according to Becky Roberts this is because she is afraid it well and her blood sugars. BMI is now 56.   Updated Medication List Outpatient Encounter Prescriptions as of 11/04/2012  Medication Sig Dispense Refill  . albuterol (PROVENTIL HFA;VENTOLIN HFA) 108 (90 BASE) MCG/ACT inhaler Inhale 2 puffs into the lungs every 6 (six) hours as needed for wheezing.  1 Inhaler  11  . amoxicillin-clavulanate (AUGMENTIN) 875-125 MG per tablet Take 1 tablet by mouth 2 (two) times daily.  14 tablet  0  . Blood Glucose Monitoring Suppl (BLOOD GLUCOSE METER) kit Use as instructed  1 each  0  . clopidogrel (PLAVIX) 75 MG tablet Take 75 mg by mouth daily.      . cyanocobalamin (,VITAMIN B-12,) 1000 MCG/ML injection Inject 1 mL (1,000 mcg total) into the muscle every 30 (thirty) days.  10 mL  12  . diazepam (VALIUM) 5 MG tablet Take 1 tablet (5 mg total) by mouth 3 (three) times daily.  90 tablet  2  . ergocalciferol (VITAMIN D2) 50000 UNITS capsule Take 1 capsule (50,000 Units total) by mouth once a week.  4 capsule  5  . furosemide (LASIX) 20 MG tablet Take one by mouth every other day for swelling  30 tablet  3  . glipiZIDE (GLUCOTROL) 10 MG tablet Take 1 tablet (10 mg total) by mouth 2 (two) times daily before a meal.  60 tablet  3  . HYDROcodone-acetaminophen (NORCO/VICODIN) 5-325 MG per tablet Take 1 tablet by  mouth every 6 (six) hours as needed for pain.  120 tablet  3  . insulin aspart protamine-insulin aspart (NOVOLOG MIX 70/30) (70-30) 100 UNIT/ML injection Inject 36 Units into the skin daily with breakfast. and 32 units with dinner  10 mL  12  . Insulin Syringe-Needle U-100 (SURE COMFORT INSULIN SYRINGE) 31G X 5/16" 1 ML MISC Test blood sugars two times daily.  100 each  6  . lisinopril (PRINIVIL,ZESTRIL) 40 MG tablet Take 1 tablet (40 mg total) by mouth daily.  90 tablet  3  . metoCLOPramide (REGLAN) 10 MG tablet Take one tablet by mouth 30 minutes before meals      . omeprazole (PRILOSEC) 20 MG capsule Take 1 capsule (20 mg total) by mouth daily.  30 capsule  3  .  ondansetron (ZOFRAN) 4 MG tablet Take 1 tablet (4 mg total) by mouth every 8 (eight) hours as needed.  90 tablet  3  . simvastatin (ZOCOR) 40 MG tablet Take 1 tablet (40 mg total) by mouth daily.  30 tablet  3  . SYRINGE-NEEDLE, DISP, 3 ML (B-D SYRINGE/NEEDLE 3CC/25GX5/8) 25G X 5/8" 3 ML MISC Use once a day  50 each  5  . Syringe/Needle, Disp, (SYRINGE LUER LOCK) 25G X 5/8" 3 ML MISC 1 application by Does not apply route every 30 (thirty) days.  50 each  0  . [DISCONTINUED] insulin aspart protamine-insulin aspart (NOVOLOG MIX 70/30) (70-30) 100 UNIT/ML injection Inject 30 Units into the skin daily with breakfast. and 26 units with dinner  10 mL  12  . insulin detemir (LEVEMIR) 100 UNIT/ML injection Inject 18 Units into the skin at bedtime.  10 mL  12

## 2012-11-05 ENCOUNTER — Encounter: Payer: Self-pay | Admitting: Internal Medicine

## 2012-11-05 DIAGNOSIS — K8010 Calculus of gallbladder with chronic cholecystitis without obstruction: Secondary | ICD-10-CM | POA: Insufficient documentation

## 2012-11-05 LAB — COMPREHENSIVE METABOLIC PANEL
CO2: 25 mEq/L (ref 19–32)
Creatinine, Ser: 1.4 mg/dL — ABNORMAL HIGH (ref 0.4–1.2)
GFR: 40.22 mL/min — ABNORMAL LOW (ref >60.00)
Glucose, Bld: 401 mg/dL — ABNORMAL HIGH (ref 70–99)
Total Bilirubin: 0.9 mg/dL (ref 0.3–1.2)
Total Protein: 6.9 g/dL (ref 6.0–8.3)

## 2012-11-05 MED ORDER — INSULIN ASPART PROT & ASPART (70-30 MIX) 100 UNIT/ML ~~LOC~~ SUSP: 36.0000 [IU] | Freq: Every day | SUBCUTANEOUS | Status: DC

## 2012-11-05 MED ORDER — INSULIN DETEMIR 100 UNIT/ML ~~LOC~~ SOLN: 18.0000 [IU] | Freq: Every day | SUBCUTANEOUS | Status: DC

## 2012-11-05 NOTE — Assessment & Plan Note (Addendum)
Accompanied by unintentional weight loss and uncontrolled diabetes in the setting of breast cancer . Etiology is unclear. She has no history of small bowel obstruction or kidney stones. Urinalysis was negative for hematuria. Given her history of breast cancer I am concerned about metastatic spread to small bowel and liver and have ordered a CT of the abdomen and pelvis.

## 2012-11-05 NOTE — Assessment & Plan Note (Addendum)
I have increased her insulin doses of 70/30 by 6 units both morning and evening. [name] will use the EMT MyChart to followup with subsequent blood sugar readings. Her creatinine is stable.

## 2012-11-05 NOTE — Assessment & Plan Note (Signed)
She has lost weight involuntarily but according to Becky Roberts this is because she is afraid it well and her blood sugars. BMI is now 26.

## 2012-11-07 ENCOUNTER — Telehealth: Payer: Self-pay | Admitting: General Practice

## 2012-11-07 LAB — URINE CULTURE

## 2012-11-07 MED ORDER — INSULIN DETEMIR 100 UNIT/ML ~~LOC~~ SOLN: 18.0000 [IU] | Freq: Every day | SUBCUTANEOUS | Status: DC

## 2012-11-07 NOTE — Telephone Encounter (Signed)
Nurse for pt called stating pt needs a refill on pull-ups sent to [facility]. [phone] Tried calling to verify what Rx needs to say. They are closed.

## 2012-11-10 NOTE — Telephone Encounter (Signed)
Called son but mailbox is full cannot leave a message.

## 2012-11-14 ENCOUNTER — Encounter: Payer: Self-pay | Admitting: Internal Medicine

## 2012-11-15 MED ORDER — INSULIN DETEMIR 100 UNIT/ML ~~LOC~~ SOLN: 18.0000 [IU] | Freq: Every day | SUBCUTANEOUS | Status: DC

## 2012-11-19 ENCOUNTER — Ambulatory Visit: Payer: Self-pay | Admitting: Internal Medicine

## 2012-11-20 ENCOUNTER — Telehealth: Payer: Self-pay | Admitting: Internal Medicine

## 2012-11-20 DIAGNOSIS — R1031 Right lower quadrant pain: Secondary | ICD-10-CM

## 2012-11-20 DIAGNOSIS — K7460 Unspecified cirrhosis of liver: Secondary | ICD-10-CM

## 2012-11-20 DIAGNOSIS — K8020 Calculus of gallbladder without cholecystitis without obstruction: Secondary | ICD-10-CM

## 2012-11-20 NOTE — Telephone Encounter (Signed)
CT of abdomen is abnormal. She has a gallbladder full of stones and her liver looks like she has cirrhosis..  I would like her to see a general surgeon to consider having the GB taken out as well as to get a liver biopsy.  Will make referral to Dr. Lemar Livings

## 2012-11-20 NOTE — Telephone Encounter (Signed)
Called pt no answer will call back later.

## 2012-11-24 ENCOUNTER — Ambulatory Visit: Payer: Self-pay | Admitting: General Surgery

## 2012-11-25 ENCOUNTER — Encounter: Payer: Self-pay | Admitting: Internal Medicine

## 2012-11-26 ENCOUNTER — Telehealth: Payer: Self-pay | Admitting: Internal Medicine

## 2012-11-26 MED ORDER — INSULIN ASPART PROT & ASPART (70-30 MIX) 100 UNIT/ML ~~LOC~~ SUSP: 36.0000 [IU] | Freq: Every day | SUBCUTANEOUS | Status: DC

## 2012-11-26 NOTE — Telephone Encounter (Signed)
Patient current using 2100 units monthly so rx for 3 vials of 70/30 insulin sent to [facility]

## 2012-11-26 NOTE — Telephone Encounter (Signed)
Son calls to request a new prescription for Novalog 70/30 mix.  With the continuous adjustments the pharmacy says she is 67 days early for the refill.  She is completely out of medication at this time and could not take her morning dose.  Currently taking 36 units in the mornings and 32 units at night.  Family is changing drug stores so please call it into [facility] at [phone].  Son denied need for triage-he is working via email on several issues with mom with Dr. Darrick Huntsman.

## 2012-11-26 NOTE — Telephone Encounter (Signed)
See phone note. Is it okay to refill medication? New pharmacy [facility].

## 2012-11-26 NOTE — Telephone Encounter (Signed)
Yes, if she is using the 70/30 pens,  She will be using a new pen every 4 days and will need 7 pens per month  (2 boxes).

## 2012-11-27 NOTE — Telephone Encounter (Signed)
rx called into pharmacy Error made and rx was not sent electronically

## 2012-12-02 ENCOUNTER — Encounter: Payer: Self-pay | Admitting: Internal Medicine

## 2012-12-02 ENCOUNTER — Telehealth: Payer: Self-pay | Admitting: Emergency Medicine

## 2012-12-02 ENCOUNTER — Telehealth: Payer: Self-pay | Admitting: Internal Medicine

## 2012-12-02 DIAGNOSIS — Z01810 Encounter for preprocedural cardiovascular examination: Secondary | ICD-10-CM

## 2012-12-02 NOTE — Telephone Encounter (Signed)
Dr. Lemar Livings thinks her gallbladder needs taking out but he wants me to be  Sure she is strong enough to gthrough surgery.  I want her to have cariology see her.  [facility] cardiology referral in process.

## 2012-12-02 NOTE — Telephone Encounter (Signed)
Pt son contacted in regard to this matter. LMOVm informing him referral was started.

## 2012-12-03 ENCOUNTER — Ambulatory Visit (INDEPENDENT_AMBULATORY_CARE_PROVIDER_SITE_OTHER): Payer: Medicaid Other | Admitting: Cardiovascular Disease

## 2012-12-03 ENCOUNTER — Encounter: Payer: Self-pay | Admitting: Cardiovascular Disease

## 2012-12-03 VITALS — BP 140/68 | HR 96 | Ht 62.5 in | Wt 180.2 lb

## 2012-12-03 DIAGNOSIS — I509 Heart failure, unspecified: Secondary | ICD-10-CM

## 2012-12-03 DIAGNOSIS — I5032 Chronic diastolic (congestive) heart failure: Secondary | ICD-10-CM

## 2012-12-03 MED ORDER — METOPROLOL TARTRATE 25 MG PO TABS: 25.0000 mg | ORAL_TABLET | Freq: Two times a day (BID) | ORAL | Status: DC

## 2012-12-03 NOTE — Assessment & Plan Note (Signed)
Becky Roberts is a 79 year old female with a several medical problems. She does not have any real cardiac issues. She does have some diastolic dysfunction  noted by echocardiography last year. She has normal left ventricular systolic function.  She is very deconditioned. She had a very rough year last year. She had breast cancer which included surgery and radiation therapy. She states that the radiation therapy really took a toll on her body. She was admitted for pneumonia a month after being treated for breast cancer. She's never really recovered since that time. She spends most of her time time in  a wheelchair or walks with a walker. She has worked with physical therapy but she continues to get weaker and weaker.   She does not complain of any chest discomfort. She has chronic shortness breath but it really sounds like more deconditioning. She gets very weak in the knees and her legs buckle when she tries to walk. It is at that time she gets short of breath. Her symptoms do not sound ischemic. They sound more like diastolic dysfunction.  Her heart rate is in the upper end of normal. I found an EKG that shows her heart rate to 105. I think that she'll do much better if we can get her heart rate down into the 70-80 range. We'll start her on metoprolol 25 mg twice a day.  I think that she is at low risk for any cardiovascular complication with her gallbladder surgery. I do think she is at risk for other medical problems including being severely deconditioned. I think the chance of her winding up in a skilled nursing facility is  high because of this issue.  I'll see her back in 3 months for followup visit.

## 2012-12-03 NOTE — Patient Instructions (Addendum)
Your physician wants you to follow-up in: 3 months with Dr. Elease Hashimoto. You will receive a reminder letter in the mail two months in advance. If you don't receive a letter, please call our office to schedule the follow-up appointment.  Your physician has recommended you make the following change in your medication:  -start metoprolol 25 mg twice daily

## 2012-12-03 NOTE — Progress Notes (Signed)
[place] Date of Birth  04-06-1932       [facility]    [place] [address]  [address] [place], [place]  [address]   [place], [place]  [address] [phone]     [phone]   Fax  [phone]    Fax [phone]  Problem List: 1. Diabetes mellitus 2. Hypertension 3. History of breast cancer- April 2013 4. Cholelithiasis 5. Hyperlipidemia 6. Pneumonia 7. Depression - worsened by the death of her daughter 4 years ago.   History of Present Illness:  Ms. Pack is a 79 yo who presents today for pre-op evaluation for cholecystectomy.    She has had some chest pains off and on for a long time.  She has chronic dyspnea.  She is very weak and uses a walker or wheelchair to get around.    She is very deconditioned - her legs buckle and get weak when she walks.  She had a very rough year last year.  She was diagnosed with breast cancer and had surgery and XRT.  She developed pneumonia the following month.  She has never recovered since that time.    She is on Plavix but does not know why. She noticed that his blood thinner. She's never had a stent. She does not recall ever having atrial fibrillation. She does not that she needs to stop the medication before surgery.   Current Outpatient Prescriptions on File Prior to Visit  Medication Sig Dispense Refill  . albuterol (PROVENTIL HFA;VENTOLIN HFA) 108 (90 BASE) MCG/ACT inhaler Inhale 2 puffs into the lungs every 6 (six) hours as needed for wheezing.  1 Inhaler  11  . Blood Glucose Monitoring Suppl (BLOOD GLUCOSE METER) kit Use as instructed  1 each  0  . clopidogrel (PLAVIX) 75 MG tablet Take 75 mg by mouth daily.      . cyanocobalamin (,VITAMIN B-12,) 1000 MCG/ML injection Inject 1 mL (1,000 mcg total) into the muscle every 30 (thirty) days.  10 mL  12  . diazepam (VALIUM) 5 MG tablet Take 1 tablet (5 mg total) by mouth 3 (three) times daily.  90 tablet  2  .  ergocalciferol (VITAMIN D2) 50000 UNITS capsule Take 1 capsule (50,000 Units total) by mouth once a week.  4 capsule  5  . glipiZIDE (GLUCOTROL) 10 MG tablet Take 1 tablet (10 mg total) by mouth 2 (two) times daily before a meal.  60 tablet  3  . HYDROcodone-acetaminophen (NORCO/VICODIN) 5-325 MG per tablet Take 1 tablet by mouth every 6 (six) hours as needed for pain.  120 tablet  3  . Insulin Syringe-Needle U-100 (SURE COMFORT INSULIN SYRINGE) 31G X 5/16" 1 ML MISC Test blood sugars two times daily.  100 each  6  . lisinopril (PRINIVIL,ZESTRIL) 40 MG tablet Take 1 tablet (40 mg total) by mouth daily.  90 tablet  3  . metoCLOPramide (REGLAN) 10 MG tablet Take one tablet by mouth 30 minutes before meals      . omeprazole (PRILOSEC) 20 MG capsule Take 1 capsule (20 mg total) by mouth daily.  30 capsule  3  . ondansetron (ZOFRAN) 4 MG tablet Take 1 tablet (4 mg total) by mouth every 8 (eight) hours as needed.  90 tablet  3  . simvastatin (ZOCOR) 40 MG tablet Take 1 tablet (40 mg total) by mouth daily.  30 tablet  3  . SYRINGE-NEEDLE, DISP, 3 ML (B-D SYRINGE/NEEDLE 3CC/25GX5/8)  25G X 5/8" 3 ML MISC Use once a day  50 each  5  . Syringe/Needle, Disp, (SYRINGE LUER LOCK) 25G X 5/8" 3 ML MISC 1 application by Does not apply route every 30 (thirty) days.  50 each  0   No current facility-administered medications on file prior to visit.    Allergies  Allergen Reactions  . Aspirin Other (See Comments)    Palpitations,sweating  . Sulfa Drugs Cross Reactors     Past Medical History  Diagnosis Date  . Blood transfusion reaction   . Diabetes mellitus   . Depression   . Hypertension   . Anemia 2012    hgb 5.5, admitted,  workup multifactorial,  b12 deficiency  . Arthritis   . Asthma   . Breast cancer July 2013    ER + PR- [name]    Past Surgical History  Procedure Laterality Date  . Breast biopsy  July 2013    [name]: wide excision     History  Smoking status  . Former Smoker    Smokeless tobacco  . Never Used    History  Alcohol Use No    Family History  Problem Relation Age of Onset  . Arthritis Mother   . Hypertension Mother   . Diabetes Mother   . Arthritis Father   . Hypertension Father   . Diabetes Father   . Arthritis Other   . Cancer Other     breast/lung    Reviw of Systems:  Reviewed in the HPI.  All other systems are negative.  Physical Exam: Blood pressure 140/68, pulse 96, height 5' 2.5" (1.588 m), weight 180 lb 4 oz (81.761 kg). General: Well developed, well nourished, in no acute distress.  Head: Normocephalic, atraumatic, sclera non-icteric, mucus membranes are moist,   Neck: Supple. Carotids are 2 + without bruits. No JVD   Lungs: Clear   Heart: RR, normal S1, S2  Abdomen: Soft, non-tender, non-distended with normal bowel sounds.  Msk:  Strength and tone are normal   Extremities: No clubbing or cyanosis. No edema.  Distal pedal pulses are 2+ and equal    Neuro: CN II - XII intact.  Alert and oriented X 3.   Psych:  Normal   ECG: December 03, 2012:  NSR at 96, occasional PVCs. LAH.  Assessment / Plan:

## 2012-12-08 ENCOUNTER — Encounter: Payer: Self-pay | Admitting: Cardiovascular Disease

## 2012-12-10 ENCOUNTER — Encounter: Payer: Self-pay | Admitting: Internal Medicine

## 2012-12-11 ENCOUNTER — Telehealth: Payer: Self-pay | Admitting: Internal Medicine

## 2012-12-11 NOTE — Telephone Encounter (Signed)
Pt son [name] called and stated Dr. Darrick Huntsman sent him an email that he needed to call and make an appt for [facility] asap.  First available opening on Dr. Melina Schools schedule is 3/18 at 2:pm  They accepted this appt time but stated they felt she needed to be seen sooner because the email stated asap.  Please advise if appt 3/18 is okay or if needs to be an add-on sooner.

## 2012-12-12 NOTE — Telephone Encounter (Signed)
Pt son called again and message left to bring his mom in at 11:30 today.

## 2012-12-12 NOTE — Telephone Encounter (Signed)
Please offer her the 11:30 appt today

## 2012-12-12 NOTE — Telephone Encounter (Signed)
Tried calling pt and emergency contacts to get in touch with someone and no numbers were working except for cell and voicemail has not been set up on her phone.

## 2012-12-16 ENCOUNTER — Encounter: Payer: Self-pay | Admitting: Internal Medicine

## 2012-12-16 ENCOUNTER — Ambulatory Visit (INDEPENDENT_AMBULATORY_CARE_PROVIDER_SITE_OTHER): Payer: Medicare HMO | Admitting: Internal Medicine

## 2012-12-16 VITALS — BP 138/64 | HR 89 | Temp 98.1°F | Resp 16 | Wt 178.5 lb

## 2012-12-16 DIAGNOSIS — N611 Abscess of the breast and nipple: Secondary | ICD-10-CM

## 2012-12-16 DIAGNOSIS — N058 Unspecified nephritic syndrome with other morphologic changes: Secondary | ICD-10-CM

## 2012-12-16 DIAGNOSIS — K8010 Calculus of gallbladder with chronic cholecystitis without obstruction: Secondary | ICD-10-CM

## 2012-12-16 DIAGNOSIS — C801 Malignant (primary) neoplasm, unspecified: Secondary | ICD-10-CM

## 2012-12-16 DIAGNOSIS — E1121 Type 2 diabetes mellitus with diabetic nephropathy: Secondary | ICD-10-CM

## 2012-12-16 DIAGNOSIS — E119 Type 2 diabetes mellitus without complications: Secondary | ICD-10-CM

## 2012-12-16 DIAGNOSIS — E1165 Type 2 diabetes mellitus with hyperglycemia: Secondary | ICD-10-CM

## 2012-12-16 DIAGNOSIS — L02229 Furuncle of trunk, unspecified: Secondary | ICD-10-CM

## 2012-12-16 MED ORDER — ONDANSETRON HCL 4 MG PO TABS: 4.0000 mg | ORAL_TABLET | Freq: Three times a day (TID) | ORAL | Status: DC | PRN

## 2012-12-16 MED ORDER — CEPHALEXIN 500 MG PO TABS: 500.0000 mg | ORAL_TABLET | Freq: Three times a day (TID) | ORAL | Status: DC

## 2012-12-16 MED ORDER — INSULIN DETEMIR 100 UNIT/ML ~~LOC~~ SOLN: SUBCUTANEOUS | Status: AC

## 2012-12-16 NOTE — Patient Instructions (Addendum)
Please add a protein to every neal to build you up before surgery (cheese, eggs, etc)  Start the cephalexin for the infection on your breast  Use aarm washclothes to encourage it to drain,  Do not squeeze it  Use the zofran (odansetron) to manage the nausea      E mail me the blood sugars every 2 weeks   we will send refills to [facility]    Resume the Levemir at 16 units ,  \we will send a [facility] for adult diapers to [facility]

## 2012-12-16 NOTE — Progress Notes (Signed)
Patient ID: Becky Roberts, female   DOB: 08/11/1932, 79 y.o.   MRN: [id number]  Patient Active Problem List  Diagnosis  . Breast cancer  . Generalized muscle weakness  . Poorly controlled type 2 diabetes mellitus  . Depression  . Hypertension  . Arthritis  . Asthma  . Breast cancer  . Obesity (BMI 30-39.9)  . Chronic cholecystitis with calculus  . Chronic diastolic congestive heart failure  . Furuncle of breast    Subjective:  CC:   Chief Complaint  Patient presents with  . Follow-up    HPI:   Becky Mansfieldis a 79 y.o. female who presents  Follow up on several issues:  1) DM.  Her bs have been less well controlled for thelast week or so by review of glucometer readings. It appears that her morning blood sugars have been ranging from 160-80. She has been using the 7030 NovoLog insulin but has run out of her Levemir for the past  3 weeks.  2) nausea:  recurrent daily, treated 2 chronic cholecystitis. She is awaiting surgery by Dr. Doristine Counter and has had her preoperative cardiology evaluation. She has run out of her Zofran which she is using to control her nausea. This is made her eating a bit erratic. Although she has plenty of food in thr house she skips dinner frequently because the smell of food makes her nauseated.  she has not lost any weight however by our scales.   3)  infected left breast. Patient states that she has had a tender fluctuant area on left lateral breast was first noticed on march 3rd. She has been applying moisturizer to it but nothing else .  It has been Spontaneously draining  for the last day or 2. Marland Kitchen    4) pre operative cardiology evaluation. She was seen by Dr. Eldred Manges her recently who started her on Lopressor for resting tachycardia. He did not feel that her perioperative risk for cardiac events was positive but did feel due to her general deconditioned state that she was a high risk for needing skilled nursing afterwards .she is tolerating the  Lopressor without hypotension or fatigue. She continues to have episodes of weakness however which she her describes his leg weakness which occur sporadically. She's not currently receiving physical therapy      Past Medical History  Diagnosis Date  . Blood transfusion reaction   . Diabetes mellitus   . Depression   . Hypertension   . Anemia 2012    hgb 5.5, admitted,  workup multifactorial,  b12 deficiency  . Arthritis   . Asthma   . Breast cancer July 2013    ER + PR- Becky Roberts    Past Surgical History  Procedure Laterality Date  . Breast biopsy  July 2013    [name]: wide excision      The following portions of the patient's history were reviewed and updated as appropriate: Allergies, current medications, and problem list.    Review of Systems:  Patient denies headache, fevers, malaise, unintentional weight loss, skin rash, eye pain, sinus congestion and sinus pain, sore throat, dysphagia,  hemoptysis , cough, dyspnea, wheezing, chest pain, palpitations, orthopnea, edema, abdominal pain, nausea, melena, diarrhea, constipation, flank pain, dysuria, hematuria, urinary  Frequency, nocturia, numbness, tingling, seizures,  Focal weakness, Loss of consciousness,  Tremor, insomnia, depression, anxiety, and suicidal ideation.      History   Social History  . Marital Status: Unknown    Spouse Name: N/A    Number of  Children: N/A  . Years of Education: N/A   Occupational History  . Not on file.   Social History Main Topics  . Smoking status: Former Games developer  . Smokeless tobacco: Never Used  . Alcohol Use: No  . Drug Use: No  . Sexually Active: Not on file   Other Topics Concern  . Not on file   Social History Narrative   Son Becky Roberts     Objective:  BP 138/64  Pulse 89  Temp(Src) 98.1 F (36.7 C) (Oral)  Resp 16  Wt 178 lb 8 oz (80.967 kg)  BMI 32.11 kg/m2  SpO2 97%  General appearance: alert, cooperative and appears stated age Ears: normal TM's and external  ear canals both ears Throat: lips, mucosa, and tongue normal; poor dentition, several teet missing  Neck: no adenopathy, no carotid bruit, supple, symmetrical, trachea midline and thyroid not enlarged, symmetric, no tenderness/mass/nodules Back: symmetric, no curvature. ROM normal. No CVA tenderness. Lungs: clear to auscultation bilaterally Heart: regular rate and rhythm, S1, S2 normal, no murmur, click, rub or gallop Abdomen: soft, non-tender; bowel sounds normal; no masses,  no organomegaly Pulses: 2+ and symmetric Skin: Skin color, texture, turgor normal. No rashes or lesions Lymph nodes: Cervical, supraclavicular, and axillary nodes normal.  Assessment and Plan:  Furuncle of breast Prescribing Keflex and warm compresses. Warned patient not to try to express fluid from the area or manipulated in any way.  Poorly controlled type 2 diabetes mellitus Complicated by patient's chronic nausea and erratic eating habits. Resume Levemir at 16 units daily and continue 70/30 insulin twice daily at current doses. Patient's son Becky Roberts will continue to submit blood sugars every 2 weeks for adjustment of insulin. She is on aspirin, ACE inhibitor, and statin.   Return in one month. She has no proteinuria by recent urinalysis. She is overdue for diabetic eye exam.   Chronic cholecystitis with calculus Per recent CT scan done for persistent right-sided pain and chronic nausea. Patient is to undergo cholecystectomy done by Dr. Lemar Livings in the very near future. She is considered low risk for perioperative cardiac events per evaluation by Dr. Emmit Alexanders but is at high risk for skilled nursing knees afterwards due to general deconditioned state due to breast cancer diabetes, and general poor health.    A total of 40 minutes was spent with patient more than half of which was spent in counseling, reviewing records from other prviders and coordination of care. Updated Medication List Outpatient Encounter Prescriptions  as of 12/16/2012  Medication Sig Dispense Refill  . albuterol (PROVENTIL HFA;VENTOLIN HFA) 108 (90 BASE) MCG/ACT inhaler Inhale 2 puffs into the lungs every 6 (six) hours as needed for wheezing.  1 Inhaler  11  . Blood Glucose Monitoring Suppl (BLOOD GLUCOSE METER) kit Use as instructed  1 each  0  . Calcium Carbonate-Vitamin D (CALCARB 600/D) 600-400 MG-UNIT per tablet Take 2 tablets by mouth daily.      . carboxymethylcellulose (REFRESH) 1 % ophthalmic solution 1 drop as needed.      . Cholecalciferol (VITAMIN D PO) Take 1.25 mg by mouth once a week.      . clopidogrel (PLAVIX) 75 MG tablet Take 75 mg by mouth daily.      . cyanocobalamin (,VITAMIN B-12,) 1000 MCG/ML injection Inject 1 mL (1,000 mcg total) into the muscle every 30 (thirty) days.  10 mL  12  . diazepam (VALIUM) 5 MG tablet Take 1 tablet (5 mg total) by mouth 3 (three) times daily.  90 tablet  2  . ergocalciferol (VITAMIN D2) 50000 UNITS capsule Take 1 capsule (50,000 Units total) by mouth once a week.  4 capsule  5  . exemestane (AROMASIN) 25 MG tablet Take 25 mg by mouth daily after breakfast.      . HYDROcodone-acetaminophen (NORCO/VICODIN) 5-325 MG per tablet Take 1 tablet by mouth every 6 (six) hours as needed for pain.  120 tablet  3  . insulin aspart protamine-insulin aspart (NOVOLOG 70/30) (70-30) 100 UNIT/ML injection Inject 36 Units into the skin daily with breakfast. and 35 units with dinner      . Insulin Syringe-Needle U-100 (SURE COMFORT INSULIN SYRINGE) 31G X 5/16" 1 ML MISC Test blood sugars two times daily.  100 each  6  . lisinopril (PRINIVIL,ZESTRIL) 40 MG tablet Take 1 tablet (40 mg total) by mouth daily.  90 tablet  3  . metoprolol tartrate (LOPRESSOR) 25 MG tablet Take 1 tablet (25 mg total) by mouth 2 (two) times daily.  180 tablet  3  . omeprazole (PRILOSEC) 20 MG capsule Take 1 capsule (20 mg total) by mouth daily.  30 capsule  3  . Polyethyl Glycol-Propyl Glycol (SYSTANE OP) Apply to eye as needed.      .  simvastatin (ZOCOR) 40 MG tablet Take 1 tablet (40 mg total) by mouth daily.  30 tablet  3  . SYRINGE-NEEDLE, DISP, 3 ML (B-D SYRINGE/NEEDLE 3CC/25GX5/8) 25G X 5/8" 3 ML MISC Use once a day  50 each  5  . Syringe/Needle, Disp, (SYRINGE LUER LOCK) 25G X 5/8" 3 ML MISC 1 application by Does not apply route every 30 (thirty) days.  50 each  0  . Cephalexin 500 MG tablet Take 1 tablet (500 mg total) by mouth 3 (three) times daily.  21 tablet  0  . glipiZIDE (GLUCOTROL) 10 MG tablet Take 1 tablet (10 mg total) by mouth 2 (two) times daily before a meal.  60 tablet  3  . insulin detemir (LEVEMIR) 100 UNIT/ML injection 16 to 20 units daily  15 mL  12  . metoCLOPramide (REGLAN) 10 MG tablet Take one tablet by mouth 30 minutes before meals      . ondansetron (ZOFRAN) 4 MG tablet Take 1 tablet (4 mg total) by mouth every 8 (eight) hours as needed.  90 tablet  3  . [DISCONTINUED] ondansetron (ZOFRAN) 4 MG tablet Take 1 tablet (4 mg total) by mouth every 8 (eight) hours as needed.  90 tablet  3   No facility-administered encounter medications on file as of 12/16/2012.     No orders of the defined types were placed in this encounter.    No Follow-up on file.

## 2012-12-17 ENCOUNTER — Other Ambulatory Visit: Payer: Self-pay | Admitting: General Practice

## 2012-12-17 DIAGNOSIS — N611 Abscess of the breast and nipple: Secondary | ICD-10-CM | POA: Insufficient documentation

## 2012-12-17 MED ORDER — GLIPIZIDE 10 MG PO TABS: 10.0000 mg | ORAL_TABLET | Freq: Two times a day (BID) | ORAL | Status: DC

## 2012-12-17 MED ORDER — ALBUTEROL SULFATE HFA 108 (90 BASE) MCG/ACT IN AERS: 2.0000 | INHALATION_SPRAY | Freq: Four times a day (QID) | RESPIRATORY_TRACT | Status: AC | PRN

## 2012-12-17 NOTE — Assessment & Plan Note (Signed)
Prescribing Keflex and warm compresses. Warned patient not to try to express fluid from the area or manipulated in any way.

## 2012-12-17 NOTE — Assessment & Plan Note (Deleted)
Complicated by patient's chronic nausea and erratic eating habits. Resume Levemir at 16 units daily and continue 70/30 insulin twice daily at current doses. Patient's son [name] will continue to submit blood sugars every 2 weeks for adjustment of insulin. She is on aspirin, ACE inhibitor, and statin.   Return in one month.

## 2012-12-17 NOTE — Assessment & Plan Note (Signed)
Per recent CT scan done for persistent right-sided pain and chronic nausea. Patient is to undergo cholecystectomy done by Dr. Lemar Livings in the very near future. She is considered low risk for perioperative cardiac events per evaluation by Dr. Emmit Alexanders but is at high risk for skilled nursing knees afterwards due to general deconditioned state due to breast cancer diabetes, and general poor health.

## 2012-12-17 NOTE — Assessment & Plan Note (Signed)
Complicated by patient's chronic nausea and erratic eating habits. Resume Levemir at 16 units daily and continue 70/30 insulin twice daily at current doses. Patient's son [name] will continue to submit blood sugars every 2 weeks for adjustment of insulin. She is on aspirin, ACE inhibitor, and statin.   Return in one month. She has no proteinuria by recent urinalysis. She is overdue for diabetic eye exam.

## 2012-12-19 ENCOUNTER — Telehealth: Payer: Self-pay | Admitting: Internal Medicine

## 2012-12-19 MED ORDER — DIAZEPAM 5 MG PO TABS: 5.0000 mg | ORAL_TABLET | Freq: Three times a day (TID) | ORAL | Status: DC

## 2012-12-19 MED ORDER — HYDROCODONE-ACETAMINOPHEN 5-325 MG PO TABS: 1.0000 | ORAL_TABLET | Freq: Four times a day (QID) | ORAL | Status: DC | PRN

## 2012-12-19 NOTE — Telephone Encounter (Signed)
Ok to refill both valium and vicodin  Authorized in epic

## 2012-12-19 NOTE — Telephone Encounter (Signed)
error 

## 2012-12-19 NOTE — Telephone Encounter (Signed)
Faxed in 3/21.

## 2012-12-22 ENCOUNTER — Other Ambulatory Visit: Payer: Self-pay | Admitting: General Practice

## 2012-12-22 ENCOUNTER — Encounter: Payer: Self-pay | Admitting: Internal Medicine

## 2012-12-23 ENCOUNTER — Telehealth: Payer: Self-pay | Admitting: General Practice

## 2012-12-23 NOTE — Telephone Encounter (Signed)
Pharmacy notified.

## 2012-12-23 NOTE — Telephone Encounter (Signed)
If they received #[phone] on 3/3 from [facility] then [facility] not [facility] it unril April 2

## 2012-12-23 NOTE — Telephone Encounter (Signed)
Pt son called stating that [facility] would not fill pt's Hydrocodone. Pharmacy was called and they are stating that the medication was just filled on 12/01/12 by [facility]. Please advise if pt should have this Rx. [facility] states they will fill if Dr. Posey Rea.

## 2013-01-14 ENCOUNTER — Ambulatory Visit: Payer: Medicare Other | Admitting: Internal Medicine

## 2013-01-15 ENCOUNTER — Other Ambulatory Visit (INDEPENDENT_AMBULATORY_CARE_PROVIDER_SITE_OTHER): Payer: Medicare HMO

## 2013-01-15 DIAGNOSIS — E119 Type 2 diabetes mellitus without complications: Secondary | ICD-10-CM

## 2013-01-15 DIAGNOSIS — E1059 Type 1 diabetes mellitus with other circulatory complications: Secondary | ICD-10-CM

## 2013-01-15 DIAGNOSIS — N179 Acute kidney failure, unspecified: Secondary | ICD-10-CM

## 2013-01-15 DIAGNOSIS — E1165 Type 2 diabetes mellitus with hyperglycemia: Secondary | ICD-10-CM

## 2013-01-15 LAB — COMPREHENSIVE METABOLIC PANEL
ALT: 27 U/L (ref 0–35)
AST: 32 U/L (ref 0–37)
CO2: 31 mEq/L (ref 19–32)
Calcium: 9.2 mg/dL (ref 8.4–10.5)
Chloride: 107 mEq/L (ref 96–112)
Creatinine, Ser: 1.6 mg/dL — ABNORMAL HIGH (ref 0.4–1.2)
GFR: 33.77 mL/min — ABNORMAL LOW (ref >60.00)
Potassium: 4.4 mEq/L (ref 3.5–5.1)
Sodium: 142 mEq/L (ref 135–145)
Total Protein: 6.4 g/dL (ref 6.0–8.3)

## 2013-01-16 ENCOUNTER — Encounter: Payer: Self-pay | Admitting: Internal Medicine

## 2013-01-17 ENCOUNTER — Encounter: Payer: Self-pay | Admitting: Internal Medicine

## 2013-01-19 ENCOUNTER — Encounter: Payer: Self-pay | Admitting: Internal Medicine

## 2013-01-19 MED ORDER — INSULIN ASPART PROT & ASPART (70-30 MIX) 100 UNIT/ML ~~LOC~~ SUSP: 42.0000 [IU] | Freq: Every day | SUBCUTANEOUS | Status: DC

## 2013-01-20 ENCOUNTER — Ambulatory Visit (INDEPENDENT_AMBULATORY_CARE_PROVIDER_SITE_OTHER): Payer: Medicare HMO | Admitting: Internal Medicine

## 2013-01-20 ENCOUNTER — Encounter: Payer: Self-pay | Admitting: Internal Medicine

## 2013-01-20 VITALS — BP 112/60 | HR 62 | Temp 98.7°F | Resp 16

## 2013-01-20 DIAGNOSIS — E785 Hyperlipidemia, unspecified: Secondary | ICD-10-CM

## 2013-01-20 DIAGNOSIS — R252 Cramp and spasm: Secondary | ICD-10-CM

## 2013-01-20 DIAGNOSIS — R531 Weakness: Secondary | ICD-10-CM

## 2013-01-20 DIAGNOSIS — M6281 Muscle weakness (generalized): Secondary | ICD-10-CM

## 2013-01-20 DIAGNOSIS — D649 Anemia, unspecified: Secondary | ICD-10-CM

## 2013-01-20 DIAGNOSIS — G2581 Restless legs syndrome: Secondary | ICD-10-CM

## 2013-01-20 DIAGNOSIS — R5383 Other fatigue: Secondary | ICD-10-CM

## 2013-01-20 DIAGNOSIS — E1165 Type 2 diabetes mellitus with hyperglycemia: Secondary | ICD-10-CM

## 2013-01-20 DIAGNOSIS — E119 Type 2 diabetes mellitus without complications: Secondary | ICD-10-CM

## 2013-01-20 LAB — IRON AND TIBC
%SAT: 33 % (ref 20–55)
Iron: 103 ug/dL (ref 42–145)

## 2013-01-20 MED ORDER — LACTULOSE ENCEPHALOPATHY 10 GM/15ML PO SOLN: 30.0000 g | Freq: Every day | ORAL | Status: DC | PRN

## 2013-01-20 NOTE — Progress Notes (Signed)
Patient ID: Becky Roberts, female   DOB: Jul 07, 1932, 79 y.o.   MRN: [id number]  Patient Active Problem List  Diagnosis  . Breast cancer  . Generalized muscle weakness  . Poorly controlled type 2 diabetes mellitus  . Depression  . Hypertension  . Arthritis  . Asthma  . Breast cancer  . Obesity (BMI 30-39.9)  . Chronic cholecystitis with calculus  . Chronic diastolic congestive heart failure  . Restless legs syndrome    Subjective:  CC:   Chief Complaint  Patient presents with  . Follow-up    For DM    HPI:   [place] Mansfieldis a 79 y.o. female who presents  Past Medical History  Diagnosis Date  . Blood transfusion reaction   . Diabetes mellitus   . Depression   . Hypertension   . Anemia 2012    hgb 5.5, admitted,  workup multifactorial,  b12 deficiency  . Arthritis   . Asthma   . Breast cancer July 2013    ER + PR- [name]    Past Surgical History  Procedure Laterality Date  . Breast biopsy  July 2013    [name]: wide excision        The following portions of the patient's history were reviewed and updated as appropriate: Allergies, current medications, and problem list.    Review of Systems:   12 Pt  review of systems was negative except those addressed in the HPI,     History   Social History  . Marital Status: Unknown    Spouse Name: N/A    Number of Children: N/A  . Years of Education: N/A   Occupational History  . Not on file.   Social History Main Topics  . Smoking status: Former Games developer  . Smokeless tobacco: Never Used  . Alcohol Use: No  . Drug Use: No  . Sexually Active: Not on file   Other Topics Concern  . Not on file   Social History Narrative   Son Becky Roberts     Objective:  BP 112/60  Pulse 62  Temp(Src) 98.7 F (37.1 C) (Oral)  Resp 16  SpO2 97%  General appearance: alert, cooperative and appears stated age Ears: normal TM's and external ear canals both ears Throat: lips, mucosa, and tongue normal;  teeth in very poor condition,  Cariesand decay noted Neck: no adenopathy, no carotid bruit, supple, symmetrical, trachea midline and thyroid not enlarged, symmetric, no tenderness/mass/nodules Back: symmetric, no curvature. ROM normal. No CVA tenderness. Lungs: clear to auscultation bilaterally Heart: regular rate and rhythm, S1, S2 normal, no murmur, click, rub or gallop Abdomen: soft, non-tender; bowel sounds normal; no masses,  no organomegaly Pulses: 2+ and symmetric Skin: Skin color, texture, turgor normal. No rashes or lesions Lymph nodes: Cervical, supraclavicular, and axillary nodes normal.  Assessment and Plan:  Poorly controlled type 2 diabetes mellitus Dose s of insulin changed for observed rise  In sugars throughout the day and normoglycemia in the am.  Samples of humalog quickpen given to son Becky Roberts to reserve for future use at lunch time or dinner time pending review of subsequent readings.    Generalized muscle weakness Patient  is  Unable to ambulate without assistance due to lower extremity weakness.  She is deepressed, unmotivated, has poorly controlled diabetes, She is homebound due to inability to ambulate  20 feet without severe shortness of breath and leg weakness.  She will benefit from home PT  Restless legs syndrome iron studies were normal.  Trial  of mirapex.    Updated Medication List Outpatient Encounter Prescriptions as of 01/20/2013  Medication Sig Dispense Refill  . albuterol (PROVENTIL HFA;VENTOLIN HFA) 108 (90 BASE) MCG/ACT inhaler Inhale 2 puffs into the lungs every 6 (six) hours as needed for wheezing.  1 Inhaler  11  . Blood Glucose Monitoring Suppl (BLOOD GLUCOSE METER) kit Use as instructed  1 each  0  . Calcium Carbonate-Vitamin D (CALCARB 600/D) 600-400 MG-UNIT per tablet Take 2 tablets by mouth daily.      . carboxymethylcellulose (REFRESH) 1 % ophthalmic solution 1 drop as needed.      . Cholecalciferol (VITAMIN D PO) Take 1.25 mg by mouth once a  week.      . clopidogrel (PLAVIX) 75 MG tablet Take 75 mg by mouth daily.      . cyanocobalamin (,VITAMIN B-12,) 1000 MCG/ML injection Inject 1 mL (1,000 mcg total) into the muscle every 30 (thirty) days.  10 mL  12  . diazepam (VALIUM) 5 MG tablet Take 1 tablet (5 mg total) by mouth 3 (three) times daily.  90 tablet  2  . ergocalciferol (VITAMIN D2) 50000 UNITS capsule Take 1 capsule (50,000 Units total) by mouth once a week.  4 capsule  5  . exemestane (AROMASIN) 25 MG tablet Take 25 mg by mouth daily after breakfast.      . HYDROcodone-acetaminophen (NORCO/VICODIN) 5-325 MG per tablet Take 1 tablet by mouth every 6 (six) hours as needed for pain.  120 tablet  3  . insulin aspart protamine-insulin aspart (NOVOLOG 70/30) (70-30) 100 UNIT/ML injection Inject 42 Units into the skin daily with breakfast. and 38 units with dinner  10 mL    . insulin detemir (LEVEMIR) 100 UNIT/ML injection 16 to 20 units daily  15 mL  12  . Insulin Syringe-Needle U-100 (SURE COMFORT INSULIN SYRINGE) 31G X 5/16" 1 ML MISC Test blood sugars two times daily.  100 each  6  . lisinopril (PRINIVIL,ZESTRIL) 40 MG tablet Take 1 tablet (40 mg total) by mouth daily.  90 tablet  3  . metoprolol tartrate (LOPRESSOR) 25 MG tablet Take 1 tablet (25 mg total) by mouth 2 (two) times daily.  180 tablet  3  . omeprazole (PRILOSEC) 20 MG capsule Take 1 capsule (20 mg total) by mouth daily.  30 capsule  3  . Polyethyl Glycol-Propyl Glycol (SYSTANE OP) Apply to eye as needed.      . simvastatin (ZOCOR) 40 MG tablet Take 1 tablet (40 mg total) by mouth daily.  30 tablet  3  . SYRINGE-NEEDLE, DISP, 3 ML (B-D SYRINGE/NEEDLE 3CC/25GX5/8) 25G X 5/8" 3 ML MISC Use once a day  50 each  5  . Syringe/Needle, Disp, (SYRINGE LUER LOCK) 25G X 5/8" 3 ML MISC 1 application by Does not apply route every 30 (thirty) days.  50 each  0  . Cephalexin 500 MG tablet Take 1 tablet (500 mg total) by mouth 3 (three) times daily.  21 tablet  0  . lactulose,  encephalopathy, (CHRONULAC) 10 GM/15ML SOLN Take 45 mLs (30 g total) by mouth daily as needed.  473 mL  0  . metoCLOPramide (REGLAN) 10 MG tablet Take one tablet by mouth 30 minutes before meals      . ondansetron (ZOFRAN) 4 MG tablet Take 1 tablet (4 mg total) by mouth every 8 (eight) hours as needed.  90 tablet  3   No facility-administered encounter medications on file as of 01/20/2013.  Orders Placed This Encounter  Procedures  . TSH  . Iron and TIBC  . Ferritin  . LDL cholesterol, direct  . Magnesium  . Basic metabolic panel  . CK  . Ambulatory referral to Home Health    No Follow-up on file.

## 2013-01-20 NOTE — Patient Instructions (Addendum)
You need to take dulcolax 10 mg at bedtime to manage narcotic induced constipation   You can use lactulose syrup for severe constipation ,  Work in about 3 hours   If your blood sugars start dropping too low in the morning,  Reduce your evening 70/30 dose by 4 units

## 2013-01-20 NOTE — Assessment & Plan Note (Addendum)
Dose s of insulin changed for observed rise  In sugars throughout the day and normoglycemia in the am.  Samples of humalog quickpen given to son [name] to reserve for future use at lunch time or dinner time pending review of subsequent readings.

## 2013-01-21 ENCOUNTER — Encounter: Payer: Self-pay | Admitting: Internal Medicine

## 2013-01-21 ENCOUNTER — Telehealth: Payer: Self-pay | Admitting: Internal Medicine

## 2013-01-21 DIAGNOSIS — G2581 Restless legs syndrome: Secondary | ICD-10-CM | POA: Insufficient documentation

## 2013-01-21 LAB — FERRITIN: Ferritin: 22 ng/mL (ref 10.0–291.0)

## 2013-01-21 LAB — CK: Total CK: 46 U/L (ref 7–177)

## 2013-01-21 LAB — BASIC METABOLIC PANEL
GFR: 37.62 mL/min — ABNORMAL LOW (ref >60.00)
Potassium: 4.1 mEq/L (ref 3.5–5.1)
Sodium: 142 mEq/L (ref 135–145)

## 2013-01-21 LAB — LDL CHOLESTEROL, DIRECT: Direct LDL: 110.8 mg/dL

## 2013-01-21 MED ORDER — ROPINIROLE HCL 0.25 MG PO TABS: ORAL_TABLET | ORAL | Status: DC

## 2013-01-21 NOTE — Assessment & Plan Note (Signed)
iron studies were normal.  Trial of mirapex.

## 2013-01-21 NOTE — Telephone Encounter (Signed)
New medication , ropinrole, sent to [facility] to treat her restless legs.  Start with 1 tablet up to 3 hours before bedtime.  Can double the  dose weekly until symptoms improve, and then we will refill at whatever dose she finds helpful

## 2013-01-21 NOTE — Assessment & Plan Note (Signed)
Patient  is  Unable to ambulate without assistance due to lower extremity weakness.  She is deepressed, unmotivated, has poorly controlled diabetes, She is homebound due to inability to ambulate  20 feet without severe shortness of breath and leg weakness.  She will benefit from home PT

## 2013-01-22 ENCOUNTER — Encounter: Payer: Self-pay | Admitting: Internal Medicine

## 2013-01-22 NOTE — Telephone Encounter (Signed)
Patient notified as instructed. 

## 2013-01-23 ENCOUNTER — Encounter: Payer: Self-pay | Admitting: Internal Medicine

## 2013-01-24 ENCOUNTER — Encounter: Payer: Self-pay | Admitting: Internal Medicine

## 2013-01-24 ENCOUNTER — Telehealth: Payer: Self-pay | Admitting: Internal Medicine

## 2013-01-28 ENCOUNTER — Other Ambulatory Visit: Payer: Self-pay | Admitting: *Deleted

## 2013-01-28 MED ORDER — SIMVASTATIN 40 MG PO TABS: 40.0000 mg | ORAL_TABLET | Freq: Every day | ORAL | Status: DC

## 2013-01-30 ENCOUNTER — Encounter: Payer: Self-pay | Admitting: Internal Medicine

## 2013-02-02 ENCOUNTER — Encounter: Payer: Self-pay | Admitting: Internal Medicine

## 2013-02-08 ENCOUNTER — Encounter: Payer: Self-pay | Admitting: Internal Medicine

## 2013-02-13 NOTE — Telephone Encounter (Signed)
Called patient regarding my chart message and son called back stating you would recommend something stronger for his mother's constipation.

## 2013-02-13 NOTE — Telephone Encounter (Signed)
Have tried to reach patient by phone no voicemail.

## 2013-02-27 ENCOUNTER — Encounter: Payer: Self-pay | Admitting: *Deleted

## 2013-02-28 LAB — HM DIABETES EYE EXAM

## 2013-03-12 ENCOUNTER — Other Ambulatory Visit: Payer: Self-pay | Admitting: Internal Medicine

## 2013-03-25 ENCOUNTER — Ambulatory Visit: Payer: Medicare HMO | Admitting: Cardiovascular Disease

## 2013-04-07 ENCOUNTER — Encounter: Payer: Self-pay | Admitting: Internal Medicine

## 2013-04-14 ENCOUNTER — Other Ambulatory Visit: Payer: Self-pay | Admitting: Internal Medicine

## 2013-04-14 NOTE — Telephone Encounter (Signed)
Okay to refill? 

## 2013-04-21 ENCOUNTER — Encounter: Payer: Self-pay | Admitting: Internal Medicine

## 2013-04-21 MED ORDER — AMOXICILLIN-POT CLAVULANATE 875-125 MG PO TABS: 1.0000 | ORAL_TABLET | Freq: Two times a day (BID) | ORAL | Status: DC

## 2013-04-21 NOTE — Telephone Encounter (Signed)
Script faxed.

## 2013-04-21 NOTE — Telephone Encounter (Signed)
Patient Information:  Caller Name: [name]  Phone: [phone]  Patient: [place], [place] D  Gender: Female  DOB: 08/03/1932  Age: 79 Years  PCP: Duncan Dull (Adults only)  Office Follow Up:  Does the office need to follow up with this patient?: Yes  Instructions For The Office: Dentist will not see her now. Also needs to schedule her 3 month OV.  RN Note:  He has called her dentist already and they will not see her until 7/28.  He is asking about an antibiotic due to her diabetes and already higher blood glucose levels.  Symptoms  Reason For Call & Symptoms: Son calling to make a 3 month check up and also to ask for antibiotics for a tooth abcess.  Same is on the right , upper jaw area.  No fever.  Minimal swelling but is having some jaw pain.  Her dentist cannot see her until 7/28.   He has increased the insulin 70/30 to 40 units for blood sugars > 300mg .  Reviewed Health History In EMR: Yes  Reviewed Medications In EMR: Yes  Reviewed Allergies In EMR: Yes  Reviewed Surgeries / Procedures: Yes  Date of Onset of Symptoms: 04/20/2013  Guideline(s) Used:  Toothache  Disposition Per Guideline:   Call Dentist Today  Reason For Disposition Reached:   Toothache present > 24 hours  Advice Given:  N/A  Patient Will Follow Care Advice:  YES

## 2013-04-22 ENCOUNTER — Telehealth: Payer: Self-pay | Admitting: *Deleted

## 2013-04-22 MED ORDER — INSULIN ASPART PROT & ASPART (70-30 MIX) 100 UNIT/ML ~~LOC~~ SUSP: 38.0000 [IU] | Freq: Every day | SUBCUTANEOUS | Status: DC

## 2013-04-22 NOTE — Telephone Encounter (Signed)
Patient's son called and wanted you to know that he has increased his mothers insulin from 36 units in AM to 38 units and the PM dose of 38 units to 40 units in the pm of the 70/30. Patient son also scheduled mother's 3 month follow up. CBG's have improved with this change.

## 2013-04-23 ENCOUNTER — Encounter: Payer: Self-pay | Admitting: Internal Medicine

## 2013-04-23 MED ORDER — INSULIN ASPART PROT & ASPART (70-30 MIX) 100 UNIT/ML ~~LOC~~ SUSP: 38.0000 [IU] | Freq: Every day | SUBCUTANEOUS | Status: DC

## 2013-04-28 ENCOUNTER — Ambulatory Visit (INDEPENDENT_AMBULATORY_CARE_PROVIDER_SITE_OTHER): Payer: Medicare Other | Admitting: Cardiovascular Disease

## 2013-04-28 ENCOUNTER — Encounter: Payer: Self-pay | Admitting: Cardiovascular Disease

## 2013-04-28 VITALS — BP 128/62 | HR 68 | Ht 62.5 in | Wt 190.2 lb

## 2013-04-28 DIAGNOSIS — I509 Heart failure, unspecified: Secondary | ICD-10-CM

## 2013-04-28 DIAGNOSIS — R079 Chest pain, unspecified: Secondary | ICD-10-CM

## 2013-04-28 DIAGNOSIS — I5032 Chronic diastolic (congestive) heart failure: Secondary | ICD-10-CM

## 2013-04-28 NOTE — Patient Instructions (Signed)
Your physician wants you to follow-up in: 1 year with Dr. Nahser. You will receive a reminder letter in the mail two months in advance. If you don't receive a letter, please call our office to schedule the follow-up appointment.   Your physician recommends that you continue on your current medications as directed. Please refer to the Current Medication list given to you today.   

## 2013-04-28 NOTE — Assessment & Plan Note (Signed)
She is better since being started on metoprolol. Her heart rate has slowed significantly.  She continues to be very deconditioned. Her medical doctor has discussed the possibility of sending her to a rehabilitation unit. I completely agree with this decision.  At this point her heart rate and blood pressure well controlled I do not think she needs any adjustment of her cardiac medications. We'll see her again in one year.

## 2013-04-28 NOTE — Progress Notes (Addendum)
[place] Date of Birth  1932-07-23       [facility]    [place] [address], [address]  [address] [place], [place]  [address]   [place], [place]  [address] [phone]     [phone]   Fax  [phone]    Fax [phone]  Problem List: 1. Diabetes mellitus 2. Hypertension 3. History of breast cancer- April 2013 4. Cholelithiasis 5. Hyperlipidemia 6. Pneumonia 7. Depression - worsened by the death of her daughter 4 years ago.   History of Present Illness:  Ms. Sammon is a 79 yo who presents today for pre-op evaluation for cholecystectomy.    She has had some chest pains off and on for a long time.  She has chronic dyspnea.  She is very weak and uses a walker or wheelchair to get around.    She is very deconditioned - her legs buckle and get weak when she walks.  She had a very rough year last year.  She was diagnosed with breast cancer and had surgery and XRT.  She developed pneumonia the following month.  She has never recovered since that time.   She is on Plavix but does not know why. She noticed that his blood thinner. She's never had a stent. She does not recall ever having atrial fibrillation. She does not that she needs to stop the medication before surgery.  April 28, 2013:  Ms Feutz is seen back for a 4 month ov.   She has diastolic dysfunction.  She has not had her cholecystectomy yet.   She does get up and walk some.  She thinks that she has improved slightly.     She does have some issues with falling and imbalance.    She has improved since being on metoprolol.   She is still very deconditioned.  Was examined in the wheelchair.    Current Outpatient Prescriptions on File Prior to Visit  Medication Sig Dispense Refill  . albuterol (PROVENTIL HFA;VENTOLIN HFA) 108 (90 BASE) MCG/ACT inhaler Inhale 2 puffs into the lungs every 6 (six) hours as needed for wheezing.  1 Inhaler  11  . amoxicillin-clavulanate  (AUGMENTIN) 875-125 MG per tablet Take 1 tablet by mouth 2 (two) times daily.  14 tablet  0  . Calcium Carbonate-Vitamin D (CALCARB 600/D) 600-400 MG-UNIT per tablet Take 2 tablets by mouth daily.      . carboxymethylcellulose (REFRESH) 1 % ophthalmic solution 1 drop as needed.      . Cephalexin 500 MG tablet Take 1 tablet (500 mg total) by mouth 3 (three) times daily.  21 tablet  0  . Cholecalciferol (VITAMIN D PO) Take 1.25 mg by mouth once a week.      . clopidogrel (PLAVIX) 75 MG tablet Take 75 mg by mouth daily.      . cyanocobalamin (,VITAMIN B-12,) 1000 MCG/ML injection Inject 1 mL (1,000 mcg total) into the muscle every 30 (thirty) days.  10 mL  12  . diazepam (VALIUM) 5 MG tablet TAKE ONE (1) TABLET THREE (3) TIMES EACH DAY  90 tablet  3  . ergocalciferol (VITAMIN D2) 50000 UNITS capsule Take 1 capsule (50,000 Units total) by mouth once a week.  4 capsule  5  . exemestane (AROMASIN) 25 MG tablet Take 25 mg by mouth daily after breakfast.      . HYDROcodone-acetaminophen (NORCO/VICODIN) 5-325 MG per tablet Take 1 tablet by mouth every 6 (six) hours  as needed for pain.  120 tablet  3  . insulin aspart protamine- aspart (NOVOLOG MIX 70/30) (70-30) 100 UNIT/ML injection Inject 0.38 mLs (38 Units total) into the skin daily with breakfast. and 40 units with dinner  10 mL  8  . insulin detemir (LEVEMIR) 100 UNIT/ML injection 16 to 20 units daily  15 mL  12  . Insulin Syringe-Needle U-100 (SURE COMFORT INSULIN SYRINGE) 31G X 5/16" 1 ML MISC Test blood sugars two times daily.  100 each  6  . lactulose, encephalopathy, (CHRONULAC) 10 GM/15ML SOLN Take 45 mLs (30 g total) by mouth daily as needed.  473 mL  0  . lisinopril (PRINIVIL,ZESTRIL) 40 MG tablet Take 1 tablet (40 mg total) by mouth daily.  90 tablet  3  . metoCLOPramide (REGLAN) 10 MG tablet Take one tablet by mouth 30 minutes before meals      . metoprolol tartrate (LOPRESSOR) 25 MG tablet Take 1 tablet (25 mg total) by mouth 2 (two) times  daily.  180 tablet  3  . omeprazole (PRILOSEC) 20 MG capsule Take 1 capsule (20 mg total) by mouth daily.  30 capsule  3  . ondansetron (ZOFRAN) 4 MG tablet Take 1 tablet (4 mg total) by mouth every 8 (eight) hours as needed.  90 tablet  3  . Polyethyl Glycol-Propyl Glycol (SYSTANE OP) Apply to eye as needed.      Marland Kitchen rOPINIRole (REQUIP) 0.25 MG tablet TAKE ONE TABLET ONE TO THREE HOURS BEFORE BEDTIME. INCREASE DOSE WEEKLY AS NEEDED  84 tablet  5  . simvastatin (ZOCOR) 40 MG tablet Take 1 tablet (40 mg total) by mouth daily.  30 tablet  5  . SYRINGE-NEEDLE, DISP, 3 ML (B-D SYRINGE/NEEDLE 3CC/25GX5/8) 25G X 5/8" 3 ML MISC Use once a day  50 each  5  . Syringe/Needle, Disp, (SYRINGE LUER LOCK) 25G X 5/8" 3 ML MISC 1 application by Does not apply route every 30 (thirty) days.  50 each  0   No current facility-administered medications on file prior to visit.    Allergies  Allergen Reactions  . Aspirin Other (See Comments)    Palpitations,sweating  . Sulfa Drugs Cross Reactors     Past Medical History  Diagnosis Date  . Blood transfusion reaction   . Diabetes mellitus   . Depression   . Hypertension   . Anemia 2012    hgb 5.5, admitted,  workup multifactorial,  b12 deficiency  . Arthritis   . Asthma   . Breast cancer July 2013    ER + PR- [name]  . Anemia in chronic kidney disease(285.21)     Past Surgical History  Procedure Laterality Date  . Breast biopsy  July 2013    [name]: wide excision   . Colonoscopy  2012    History  Smoking status  . Former Smoker  Smokeless tobacco  . Never Used    History  Alcohol Use No    Family History  Problem Relation Age of Onset  . Arthritis Mother   . Hypertension Mother   . Diabetes Mother   . Arthritis Father   . Hypertension Father   . Diabetes Father   . Arthritis Other   . Cancer Other     breast/lung    Reviw of Systems:  Reviewed in the HPI.  All other systems are negative.  Physical Exam: Blood pressure  128/62, pulse 68, height 5' 2.5" (1.588 m), weight 190 lb 4 oz (86.297 kg). General: appears  stronger than last visit.    Head: Normocephalic, atraumatic, sclera non-icteric, mucus membranes are moist,   Neck: Supple. Carotids are 2 + without bruits. No JVD   Lungs: Clear   Heart: RR, normal S1, S2  Abdomen: Soft, non-tender, non-distended with normal bowel sounds.  Msk:  Strength and tone are normal   Extremities: No clubbing or cyanosis. No edema.  Distal pedal pulses are 2+ and equal    Neuro: CN II - XII intact.  Alert and oriented X 3.   Psych:  Normal / may be slightly depressed.    ECG: April 28, 2013:  NSR at 55.  Poor R wave progression ( was done in a seated position).  No ST or T wave changes.   Assessment / Plan:

## 2013-04-30 ENCOUNTER — Encounter: Payer: Self-pay | Admitting: Internal Medicine

## 2013-04-30 ENCOUNTER — Ambulatory Visit (INDEPENDENT_AMBULATORY_CARE_PROVIDER_SITE_OTHER): Payer: Medicare HMO | Admitting: Internal Medicine

## 2013-04-30 VITALS — BP 118/62 | HR 74 | Temp 98.2°F | Resp 14

## 2013-04-30 DIAGNOSIS — Z0283 Encounter for blood-alcohol and blood-drug test: Secondary | ICD-10-CM

## 2013-04-30 DIAGNOSIS — E559 Vitamin D deficiency, unspecified: Secondary | ICD-10-CM

## 2013-04-30 DIAGNOSIS — K8010 Calculus of gallbladder with chronic cholecystitis without obstruction: Secondary | ICD-10-CM

## 2013-04-30 DIAGNOSIS — Z853 Personal history of malignant neoplasm of breast: Secondary | ICD-10-CM

## 2013-04-30 DIAGNOSIS — M6281 Muscle weakness (generalized): Secondary | ICD-10-CM

## 2013-04-30 DIAGNOSIS — C801 Malignant (primary) neoplasm, unspecified: Secondary | ICD-10-CM

## 2013-04-30 DIAGNOSIS — K044 Acute apical periodontitis of pulpal origin: Secondary | ICD-10-CM

## 2013-04-30 DIAGNOSIS — Z23 Encounter for immunization: Secondary | ICD-10-CM

## 2013-04-30 DIAGNOSIS — G2581 Restless legs syndrome: Secondary | ICD-10-CM

## 2013-04-30 DIAGNOSIS — E669 Obesity, unspecified: Secondary | ICD-10-CM

## 2013-04-30 DIAGNOSIS — K047 Periapical abscess without sinus: Secondary | ICD-10-CM

## 2013-04-30 DIAGNOSIS — E119 Type 2 diabetes mellitus without complications: Secondary | ICD-10-CM

## 2013-04-30 DIAGNOSIS — E1165 Type 2 diabetes mellitus with hyperglycemia: Secondary | ICD-10-CM

## 2013-04-30 DIAGNOSIS — I10 Essential (primary) hypertension: Secondary | ICD-10-CM

## 2013-04-30 DIAGNOSIS — Z0189 Encounter for other specified special examinations: Secondary | ICD-10-CM

## 2013-04-30 DIAGNOSIS — E1149 Type 2 diabetes mellitus with other diabetic neurological complication: Secondary | ICD-10-CM

## 2013-04-30 LAB — COMPREHENSIVE METABOLIC PANEL
ALT: 22 U/L (ref 0–35)
AST: 32 U/L (ref 0–37)
Alkaline Phosphatase: 95 U/L (ref 39–117)
Potassium: 3.7 mEq/L (ref 3.5–5.1)
Sodium: 142 mEq/L (ref 135–145)
Total Bilirubin: 1 mg/dL (ref 0.3–1.2)
Total Protein: 6.4 g/dL (ref 6.0–8.3)

## 2013-04-30 LAB — HM DIABETES FOOT EXAM: HM Diabetic Foot Exam: NORMAL

## 2013-04-30 LAB — HEMOGLOBIN A1C: Hgb A1c MFr Bld: 9.5 % — ABNORMAL HIGH (ref 4.6–6.5)

## 2013-04-30 MED ORDER — HYDROCODONE-ACETAMINOPHEN 5-325 MG PO TABS: 1.0000 | ORAL_TABLET | Freq: Four times a day (QID) | ORAL | Status: DC | PRN

## 2013-04-30 MED ORDER — CHLORHEXIDINE GLUCONATE 0.12 % MT SOLN: 15.0000 mL | Freq: Two times a day (BID) | OROMUCOSAL | Status: DC

## 2013-04-30 MED ORDER — TETANUS-DIPHTH-ACELL PERTUSSIS 5-2.5-18.5 LF-MCG/0.5 IM SUSP: 0.5000 mL | Freq: Once | INTRAMUSCULAR | Status: DC

## 2013-04-30 MED ORDER — ROPINIROLE HCL 0.5 MG PO TABS: ORAL_TABLET | ORAL | Status: DC

## 2013-04-30 NOTE — Patient Instructions (Addendum)
You do not need to take plavix anymore. It increases your risk for bleeding  I sent a stonger prescriptio nto your pharmacy for the restless legs medication. ([place]ip) Start with one tablet before bedtime  finish the antibiotics,  Then start the mouth rinse twice daily   You received the pneumonia vaccine today but you still need the tetanus diphtheria and pertussis vaccine ,  It is [facility] at [facility] .  It is important to prevent tetanus and whooping cough

## 2013-04-30 NOTE — Assessment & Plan Note (Signed)
I have addressed  BMI and recommended a low glycemic index diet utilizing smaller more frequent meals to increase metabolism.  I have also recommended that patient start exercising with a goal of 30 minutes of aerobic exercise a minimum of 5 days per week.  

## 2013-04-30 NOTE — Assessment & Plan Note (Addendum)
sleeping better on 0.5 mg at bedtime

## 2013-04-30 NOTE — Progress Notes (Signed)
Patient ID: Becky Roberts, female   DOB: 12-06-1931, 79 y.o.   MRN: [id number]   Patient Active Problem List   Diagnosis Date Noted  . Restless legs syndrome 01/21/2013  . Chronic diastolic congestive heart failure 12/03/2012  . Chronic cholecystitis with calculus 11/05/2012  . Obesity (BMI 30-39.9) 04/17/2012  . Poorly controlled type 2 diabetes mellitus   . Depression   . Hypertension   . Arthritis   . Asthma   . Breast cancer 03/31/2012  . Generalized muscle weakness 03/06/2012  . Breast cancer 03/05/2012    Subjective:  CC:   Chief Complaint  Patient presents with  . Follow-up    3 month follow up    HPI:   Becky Mansfieldis a 79 y.o. female who presents for 3 month follow up on DM Type 2, poorly controlled, hypertension, chronic nausea and history of BRCA.  Her son Becky Roberts has moved in with her to help out with medications and meals.  He is overseeing her insulin use and she is currently taking Insulin 70/30  40 units Q am and 38units  qpm  bid and levemir.  No recent lows. Blood sugars 130 to 160 in the mornin and post prandially peer their handwritten log.  Eating better.,  Has gained weight. denies abdominal pain currently , has had plans for cholecsytectomy postponed .   Has chronic joint pain and restless leg syndrome with insomnia. Using 3 vicodin daily for pain control.  Son has been trying to get her to walk for 15 minutes daily which has been occurring about every day    Past Medical History  Diagnosis Date  . Blood transfusion reaction   . Diabetes mellitus   . Depression   . Hypertension   . Anemia 2012    hgb 5.5, admitted,  workup multifactorial,  b12 deficiency  . Arthritis   . Asthma   . Breast cancer July 2013    ER + PR- Becky Roberts  . Anemia in chronic kidney disease(285.21)     Past Surgical History  Procedure Laterality Date  . Breast biopsy  July 2013    Becky Roberts: wide excision   . Colonoscopy  2012       The following portions of  the patient's history were reviewed and updated as appropriate: Allergies, current medications, and problem list.    Review of Systems:   Patient denies headache, fevers, malaise, unintentional weight loss, skin rash, eye pain, sinus congestion and sinus pain, sore throat, dysphagia,  hemoptysis , cough, dyspnea, wheezing, chest pain, palpitations, orthopnea, edema, abdominal pain, nausea, melena, diarrhea, constipation, flank pain, dysuria, hematuria, urinary  Frequency, nocturia, numbness, tingling, seizures,  Focal weakness, Loss of consciousness,  Tremor, insomnia, depression, anxiety, and suicidal ideation.      History   Social History  . Marital Status: Unknown    Spouse Name: N/A    Number of Children: N/A  . Years of Education: N/A   Occupational History  . Not on file.   Social History Main Topics  . Smoking status: Former Games developer  . Smokeless tobacco: Never Used  . Alcohol Use: No  . Drug Use: No  . Sexually Active: Not on file   Other Topics Concern  . Not on file   Social History Narrative   Son Becky Roberts     Objective:  BP 118/62  Pulse 74  Temp(Src) 98.2 F (36.8 C) (Oral)  Resp 14  SpO2 98%  General appearance: alert, cooperative and appears stated age Ears:  normal TM's and external ear canals both ears Throat: lips, mucosa, and tongue normal; teeth and gums normal Neck: no adenopathy, no carotid bruit, supple, symmetrical, trachea midline and thyroid not enlarged, symmetric, no tenderness/mass/nodules Back: symmetric, no curvature. ROM normal. No CVA tenderness. Lungs: clear to auscultation bilaterally Heart: regular rate and rhythm, S1, S2 normal, no murmur, click, rub or gallop Abdomen: soft, non-tender; bowel sounds normal; no masses,  no organomegaly Pulses: 2+ and symmetric Skin: Skin color, texture, turgor normal. No rashes or lesions Lymph nodes: Cervical, supraclavicular, and axillary nodes normal. Foot exam:  Nails are well trimmed,  No  callouses,  Sensation intact to microfilament   Assessment and Plan:  Restless legs syndrome sleeping better on 0.5 mg at bedtime   Obesity (BMI 30-39.9) I have addressed  BMI and recommended a low glycemic index diet utilizing smaller more frequent meals to increase metabolism.  I have also recommended that patient start exercising with a goal of 30 minutes of aerobic exercise a minimum of 5 days per week.   Poorly controlled type 2 diabetes mellitus  under poor control on current regimen,   Despite son Becky Roberts managing her insuling regimen.,  A1c is 9.5. i will increase 70/30 insulindoses by 2 units bid  And review blood sugars every 2 weeks.   Hypertension Well controlled on current regimen. Renal function stable, no changes today.  Chronic cholecystitis with calculus Patient has no current symptoms and would be a poor candidate foe surgery currently due to uncontrolled DM.   Generalized muscle weakness Secondary to deconditioning,  improved with home PT>    Dental infection She was treated empirically for dental infection and her pain has resolved but she has not made an appt yet wither her dentist to see if her tooth needs pulling.  I have advised her to continue twice daily chlorhexidine rinses.   Breast cancer S/p right sided wide excision July 2013 and aromasin..  She missed her appt with dr Becky Roberts recently and is due for mammogram.   A total of 40 minutes was spent with patient more than half of which was spent in counseling, reviewing records from other prviders and coordination of care.   Updated Medication List Outpatient Encounter Prescriptions as of 04/30/2013  Medication Sig Dispense Refill  . albuterol (PROVENTIL HFA;VENTOLIN HFA) 108 (90 BASE) MCG/ACT inhaler Inhale 2 puffs into the lungs every 6 (six) hours as needed for wheezing.  1 Inhaler  11  . Calcium Carbonate-Vitamin D (CALCARB 600/D) 600-400 MG-UNIT per tablet Take 2 tablets by mouth daily.      .  carboxymethylcellulose (REFRESH) 1 % ophthalmic solution 1 drop as needed.      . Cholecalciferol (VITAMIN D PO) Take 1.25 mg by mouth once a week.      . cyanocobalamin (,VITAMIN B-12,) 1000 MCG/ML injection Inject 1 mL (1,000 mcg total) into the muscle every 30 (thirty) days.  10 mL  12  . diazepam (VALIUM) 5 MG tablet TAKE ONE (1) TABLET THREE (3) TIMES EACH DAY  90 tablet  3  . ergocalciferol (VITAMIN D2) 50000 UNITS capsule Take 1 capsule (50,000 Units total) by mouth once a week.  4 capsule  5  . exemestane (AROMASIN) 25 MG tablet Take 25 mg by mouth daily after breakfast.      . HYDROcodone-acetaminophen (NORCO/VICODIN) 5-325 MG per tablet Take 1 tablet by mouth every 6 (six) hours as needed for pain.  120 tablet  3  . insulin aspart protamine- aspart (NOVOLOG  MIX 70/30) (70-30) 100 UNIT/ML injection Inject 0.38 mLs (38 Units total) into the skin daily with breakfast. and 40 units with dinner  10 mL  8  . insulin detemir (LEVEMIR) 100 UNIT/ML injection 16 to 20 units daily  15 mL  12  . Insulin Syringe-Needle U-100 (SURE COMFORT INSULIN SYRINGE) 31G X 5/16" 1 ML MISC Test blood sugars two times daily.  100 each  6  . lisinopril (PRINIVIL,ZESTRIL) 40 MG tablet Take 1 tablet (40 mg total) by mouth daily.  90 tablet  3  . metoCLOPramide (REGLAN) 10 MG tablet Take one tablet by mouth 30 minutes before meals      . metoprolol tartrate (LOPRESSOR) 25 MG tablet Take 1 tablet (25 mg total) by mouth 2 (two) times daily.  180 tablet  3  . omeprazole (PRILOSEC) 20 MG capsule Take 1 capsule (20 mg total) by mouth daily.  30 capsule  3  . ondansetron (ZOFRAN) 4 MG tablet Take 1 tablet (4 mg total) by mouth every 8 (eight) hours as needed.  90 tablet  3  . Polyethyl Glycol-Propyl Glycol (SYSTANE OP) Apply to eye as needed.      Marland Kitchen rOPINIRole (REQUIP) 0.5 MG tablet TAKE ONE TABLET ONE TO THREE HOURS BEFORE BEDTIME. INCREASE DOSE WEEKLY AS NEEDED  60 tablet  5  . simvastatin (ZOCOR) 40 MG tablet Take 1 tablet  (40 mg total) by mouth daily.  30 tablet  5  . SYRINGE-NEEDLE, DISP, 3 ML (B-D SYRINGE/NEEDLE 3CC/25GX5/8) 25G X 5/8" 3 ML MISC Use once a day  50 each  5  . Syringe/Needle, Disp, (SYRINGE LUER LOCK) 25G X 5/8" 3 ML MISC 1 application by Does not apply route every 30 (thirty) days.  50 each  0  . [DISCONTINUED] amoxicillin-clavulanate (AUGMENTIN) 875-125 MG per tablet Take 1 tablet by mouth 2 (two) times daily.  14 tablet  0  . [DISCONTINUED] HYDROcodone-acetaminophen (NORCO/VICODIN) 5-325 MG per tablet Take 1 tablet by mouth every 6 (six) hours as needed for pain.  120 tablet  3  . [DISCONTINUED] rOPINIRole (REQUIP) 0.25 MG tablet TAKE ONE TABLET ONE TO THREE HOURS BEFORE BEDTIME. INCREASE DOSE WEEKLY AS NEEDED  84 tablet  5  . Cephalexin 500 MG tablet Take 1 tablet (500 mg total) by mouth 3 (three) times daily.  21 tablet  0  . chlorhexidine (PERIDEX) 0.12 % solution Use as directed 15 mLs in the mouth or throat 2 (two) times daily.  120 mL  0  . lactulose, encephalopathy, (CHRONULAC) 10 GM/15ML SOLN Take 45 mLs (30 g total) by mouth daily as needed.  473 mL  0  . TDaP (BOOSTRIX) 5-2.5-18.5 LF-MCG/0.5 injection Inject 0.5 mLs into the muscle once.  0.5 mL  0  . [DISCONTINUED] clopidogrel (PLAVIX) 75 MG tablet Take 75 mg by mouth daily.       No facility-administered encounter medications on file as of 04/30/2013.

## 2013-05-01 ENCOUNTER — Encounter: Payer: Self-pay | Admitting: Internal Medicine

## 2013-05-01 LAB — DRUG SCREEN, URINE
Amphetamine Screen, Ur: NEGATIVE
Barbiturate Quant, Ur: NEGATIVE
Cocaine Metabolites: NEGATIVE
Marijuana Metabolite: NEGATIVE
Opiates: NEGATIVE

## 2013-05-02 ENCOUNTER — Encounter: Payer: Self-pay | Admitting: Internal Medicine

## 2013-05-02 DIAGNOSIS — K047 Periapical abscess without sinus: Secondary | ICD-10-CM | POA: Insufficient documentation

## 2013-05-02 NOTE — Assessment & Plan Note (Addendum)
S/p right sided wide excision July 2013 and aromasin..  She missed her appt with dr byrnett recently and is due for mammogram.

## 2013-05-02 NOTE — Assessment & Plan Note (Signed)
Patient has no current symptoms and would be a poor candidate foe surgery currently due to uncontrolled DM.

## 2013-05-02 NOTE — Assessment & Plan Note (Signed)
under poor control on current regimen,   Despite son [name] managing her insuling regimen.,  A1c is 9.5. i will increase 70/30 insulindoses by 2 units bid  And review blood sugars every 2 weeks.

## 2013-05-02 NOTE — Assessment & Plan Note (Signed)
Well controlled on current regimen. Renal function stable, no changes today. 

## 2013-05-02 NOTE — Assessment & Plan Note (Signed)
She was treated empirically for dental infection and her pain has resolved but she has not made an appt yet wither her dentist to see if her tooth needs pulling.  I have advised her to continue twice daily chlorhexidine rinses.

## 2013-05-02 NOTE — Assessment & Plan Note (Signed)
Secondary to deconditioning,  improved with home PT>

## 2013-05-06 ENCOUNTER — Telehealth: Payer: Self-pay | Admitting: *Deleted

## 2013-05-06 MED ORDER — HYDROCODONE-ACETAMINOPHEN 10-325 MG PO TABS: 1.0000 | ORAL_TABLET | Freq: Four times a day (QID) | ORAL | Status: DC | PRN

## 2013-05-06 NOTE — Telephone Encounter (Signed)
Patient son came in and said dose on pain medication not correct because to many pills per day and trying to cut back on the amount of medication patient is taking. Patient son also stated this was discussed during last OV. Script for [employer] 5-325 Take 1 pill by mouth every six hours as needed or pain. I have placed original script in red folder please advise.

## 2013-05-06 NOTE — Telephone Encounter (Signed)
Correct,  i had e mailed him that i could not change if he had filled the other one.  Higher potency vicodin to be printed

## 2013-05-07 NOTE — Telephone Encounter (Signed)
Script faxed to pharmacy

## 2013-05-07 NOTE — Telephone Encounter (Signed)
Called patient voicemail not set up will continue to try and reach.

## 2013-05-15 ENCOUNTER — Encounter: Payer: Self-pay | Admitting: Internal Medicine

## 2013-05-18 ENCOUNTER — Other Ambulatory Visit: Payer: Self-pay | Admitting: Internal Medicine

## 2013-06-12 ENCOUNTER — Other Ambulatory Visit: Payer: Self-pay | Admitting: Internal Medicine

## 2013-06-18 ENCOUNTER — Encounter: Payer: Self-pay | Admitting: Internal Medicine

## 2013-06-24 ENCOUNTER — Ambulatory Visit: Payer: Self-pay | Admitting: General Surgery

## 2013-06-24 ENCOUNTER — Other Ambulatory Visit: Payer: Self-pay | Admitting: Internal Medicine

## 2013-06-24 ENCOUNTER — Encounter: Payer: Self-pay | Admitting: Internal Medicine

## 2013-06-24 NOTE — Telephone Encounter (Signed)
Glipizide was stopped 01/19/13 per Dr. Ulyess Blossom message

## 2013-07-02 ENCOUNTER — Encounter: Payer: Self-pay | Admitting: Internal Medicine

## 2013-07-02 ENCOUNTER — Ambulatory Visit: Payer: Self-pay | Admitting: General Surgery

## 2013-07-03 ENCOUNTER — Encounter: Payer: Self-pay | Admitting: General Surgery

## 2013-07-14 ENCOUNTER — Ambulatory Visit: Payer: Self-pay | Admitting: General Surgery

## 2013-07-30 ENCOUNTER — Encounter: Payer: Self-pay | Admitting: Internal Medicine

## 2013-07-30 ENCOUNTER — Ambulatory Visit: Payer: Self-pay | Admitting: General Surgery

## 2013-07-31 ENCOUNTER — Encounter: Payer: Self-pay | Admitting: *Deleted

## 2013-07-31 MED ORDER — GUAIFENESIN-CODEINE 100-10 MG/5ML PO SYRP: 5.0000 mL | ORAL_SOLUTION | Freq: Three times a day (TID) | ORAL | Status: AC | PRN

## 2013-07-31 MED ORDER — LEVOFLOXACIN 500 MG PO TABS: 500.0000 mg | ORAL_TABLET | Freq: Every day | ORAL | Status: DC

## 2013-08-03 ENCOUNTER — Ambulatory Visit: Payer: Medicare HMO | Admitting: Internal Medicine

## 2013-08-03 ENCOUNTER — Ambulatory Visit: Payer: Self-pay | Admitting: General Surgery

## 2013-08-05 ENCOUNTER — Ambulatory Visit: Payer: Self-pay | Admitting: General Surgery

## 2013-08-06 ENCOUNTER — Other Ambulatory Visit: Payer: Self-pay

## 2013-08-10 ENCOUNTER — Encounter: Payer: Self-pay | Admitting: *Deleted

## 2013-08-11 ENCOUNTER — Ambulatory Visit: Payer: Medicare HMO | Admitting: Internal Medicine

## 2013-08-13 ENCOUNTER — Ambulatory Visit: Payer: Self-pay | Admitting: General Surgery

## 2013-08-17 ENCOUNTER — Encounter: Payer: Self-pay | Admitting: General Surgery

## 2013-08-17 ENCOUNTER — Ambulatory Visit (INDEPENDENT_AMBULATORY_CARE_PROVIDER_SITE_OTHER): Payer: Medicare PPO | Admitting: General Surgery

## 2013-08-17 VITALS — BP 140/72 | HR 78 | Resp 12 | Ht 62.5 in | Wt 190.0 lb

## 2013-08-17 DIAGNOSIS — Z853 Personal history of malignant neoplasm of breast: Secondary | ICD-10-CM

## 2013-08-17 DIAGNOSIS — Z1239 Encounter for other screening for malignant neoplasm of breast: Secondary | ICD-10-CM
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
## 2013-08-17 NOTE — Patient Instructions (Signed)
Patient to return in 6 months.

## 2013-08-18 ENCOUNTER — Encounter: Payer: Self-pay | Admitting: *Deleted

## 2013-08-19 ENCOUNTER — Ambulatory Visit: Payer: Medicare HMO | Admitting: Internal Medicine

## 2013-08-21 ENCOUNTER — Encounter: Payer: Self-pay | Admitting: General Surgery

## 2013-08-21 ENCOUNTER — Other Ambulatory Visit: Payer: Self-pay | Admitting: Internal Medicine

## 2013-08-21 NOTE — Telephone Encounter (Signed)
NO FUTURE REFILLS

## 2013-08-24 NOTE — Telephone Encounter (Signed)
Patient has appointment for 09/07/13 notified patient she must keep for future refills

## 2013-08-25 ENCOUNTER — Encounter: Payer: Self-pay | Admitting: Internal Medicine

## 2013-08-25 ENCOUNTER — Telehealth: Payer: Self-pay | Admitting: Internal Medicine

## 2013-08-25 NOTE — Telephone Encounter (Signed)
Pt's son came by to pick up meds for valium and pain medication. Gave the son the rx for Valium and Olegario Messier had already faxed that into pharmacy but pt is also needing pain medication and son said he can come by to pick that one up when it is ready.

## 2013-08-25 NOTE — Telephone Encounter (Signed)
Patient never mentioned pain med when I called to schedule appointment.

## 2013-08-25 NOTE — Telephone Encounter (Signed)
THIS PATIENT HAS NO SHOWED OR CANCELLED AT THE VERY LAST MINUTE THE LAST 3 TIMES.  ALL 30 MINUTE VISITS.  There will be no more refills on narcotics .

## 2013-08-25 NOTE — Telephone Encounter (Signed)
Last visit 04/30/13, sent myChart message requesting refill of hydrocodone. Refill?

## 2013-08-26 NOTE — Telephone Encounter (Signed)
Patient notified

## 2013-08-31 ENCOUNTER — Other Ambulatory Visit: Payer: Self-pay | Admitting: Internal Medicine

## 2013-09-04 ENCOUNTER — Encounter: Payer: Self-pay | Admitting: *Deleted

## 2013-09-07 ENCOUNTER — Encounter: Payer: Self-pay | Admitting: Internal Medicine

## 2013-09-07 ENCOUNTER — Ambulatory Visit (INDEPENDENT_AMBULATORY_CARE_PROVIDER_SITE_OTHER): Payer: Medicare HMO | Admitting: Internal Medicine

## 2013-09-07 VITALS — BP 140/74 | HR 71 | Temp 97.8°F | Resp 12

## 2013-09-07 DIAGNOSIS — I10 Essential (primary) hypertension: Secondary | ICD-10-CM

## 2013-09-07 DIAGNOSIS — Z9119 Patient's noncompliance with other medical treatment and regimen: Secondary | ICD-10-CM

## 2013-09-07 DIAGNOSIS — Z91199 Patient's noncompliance with other medical treatment and regimen due to unspecified reason: Secondary | ICD-10-CM

## 2013-09-07 DIAGNOSIS — E119 Type 2 diabetes mellitus without complications: Secondary | ICD-10-CM

## 2013-09-07 DIAGNOSIS — E669 Obesity, unspecified: Secondary | ICD-10-CM

## 2013-09-07 DIAGNOSIS — E1165 Type 2 diabetes mellitus with hyperglycemia: Secondary | ICD-10-CM

## 2013-09-07 DIAGNOSIS — E1159 Type 2 diabetes mellitus with other circulatory complications: Secondary | ICD-10-CM

## 2013-09-07 DIAGNOSIS — Z23 Encounter for immunization: Secondary | ICD-10-CM

## 2013-09-07 DIAGNOSIS — D649 Anemia, unspecified: Secondary | ICD-10-CM

## 2013-09-07 DIAGNOSIS — G2581 Restless legs syndrome: Secondary | ICD-10-CM

## 2013-09-07 DIAGNOSIS — Z9111 Patient's noncompliance with dietary regimen: Secondary | ICD-10-CM | POA: Insufficient documentation

## 2013-09-07 DIAGNOSIS — K8010 Calculus of gallbladder with chronic cholecystitis without obstruction: Secondary | ICD-10-CM

## 2013-09-07 LAB — COMPREHENSIVE METABOLIC PANEL
ALT: 19 U/L (ref 0–35)
AST: 35 U/L (ref 0–37)
Albumin: 3 g/dL — ABNORMAL LOW (ref 3.5–5.2)
Alkaline Phosphatase: 96 U/L (ref 39–117)
Creatinine, Ser: 1.3 mg/dL — ABNORMAL HIGH (ref 0.4–1.2)
Potassium: 3.8 mEq/L (ref 3.5–5.1)
Sodium: 138 mEq/L (ref 135–145)
Total Protein: 6.5 g/dL (ref 6.0–8.3)

## 2013-09-07 LAB — CBC WITH DIFFERENTIAL/PLATELET
Basophils Absolute: 0 10*3/uL (ref 0.0–0.1)
Eosinophils Absolute: 0.1 10*3/uL (ref 0.0–0.7)
HCT: 39.2 % (ref 36.0–46.0)
Hemoglobin: 13.3 g/dL (ref 12.0–15.0)
Lymphocytes Relative: 24.5 % (ref 12.0–46.0)
Lymphs Abs: 1.6 10*3/uL (ref 0.7–4.0)
MCHC: 33.9 g/dL (ref 30.0–36.0)
MCV: 96.4 fl (ref 78.0–100.0)
Monocytes Absolute: 0.5 10*3/uL (ref 0.1–1.0)
Monocytes Relative: 7.6 % (ref 3.0–12.0)
Neutro Abs: 4.3 10*3/uL (ref 1.4–7.7)
Platelets: 124 10*3/uL — ABNORMAL LOW (ref 150.0–400.0)
RDW: 13.6 % (ref 11.5–14.6)

## 2013-09-07 LAB — HEMOGLOBIN A1C: Hgb A1c MFr Bld: 10 % — ABNORMAL HIGH (ref 4.6–6.5)

## 2013-09-07 LAB — HM DIABETES FOOT EXAM: HM Diabetic Foot Exam: NORMAL

## 2013-09-07 LAB — MICROALBUMIN / CREATININE URINE RATIO: Microalb, Ur: 3 mg/dL — ABNORMAL HIGH (ref 0.0–1.9)

## 2013-09-07 MED ORDER — HYDROCODONE-ACETAMINOPHEN 10-325 MG PO TABS: 1.0000 | ORAL_TABLET | Freq: Four times a day (QID) | ORAL | Status: DC | PRN

## 2013-09-07 NOTE — Assessment & Plan Note (Signed)
Diet reviewd and is non diabetic due to use of frosted cereals and regular sodas.

## 2013-09-07 NOTE — Assessment & Plan Note (Signed)
Controlled with ropinrole

## 2013-09-07 NOTE — Assessment & Plan Note (Signed)
Well controlled on current regimen. Renal function stable, no changes today.  Lab Results  Component Value Date   CREATININE 1.3* 09/07/2013

## 2013-09-07 NOTE — Assessment & Plan Note (Addendum)
diabetes is under poor control on current regimen, a1c is 10.0.  Patieint asked to submit a log of the last week of blood sugars .  I am interesting in fastings and 2 hour post prandials (after a meal)along with a brief description of the meal content related to the particular blood sugars.

## 2013-09-07 NOTE — Progress Notes (Signed)
Pre-visit discussion using our clinic review tool. No additional management support is needed unless otherwise documented below in the visit note.  

## 2013-09-07 NOTE — Progress Notes (Signed)
Patient ID: Becky Roberts, female   DOB: [birth year], 79 y.o.   MRN: [id number]  Patient Active Problem List   Diagnosis Date Noted  . Breast screening 08/17/2013  . History of breast cancer 08/17/2013  . Dental infection 05/02/2013  . Restless legs syndrome 01/21/2013  . Chronic diastolic congestive heart failure 12/03/2012  . Chronic cholecystitis with calculus 11/05/2012  . Obesity (BMI 30-39.9) 04/17/2012  . Poorly controlled type 2 diabetes mellitus   . Depression   . Hypertension   . Arthritis   . Asthma   . Breast cancer 03/31/2012  . Generalized muscle weakness 03/06/2012  . Breast cancer 03/05/2012    Subjective:  CC:   Chief Complaint  Patient presents with  . Follow-up    for medication    HPI:   Becky Mansfieldis a 79 y.o. female who presents 5 month follow up on diabetes mellitus, generalized anxiety and chronic pain. She missed 2 appts recently, both  no shows.  Her vicodin was not refilled until she was seen.     Recurrent nausea.  She has been having recurrent nausea  On a nearly daily basis..Blood sugars have running from 140 to 210,  Higher in the evenings because she gets freqeuntly nauseated and drinks soda to prevent vomiting .  Takes a vicodin and ropinrole and feels better,  Able to rest.     Past Medical History  Diagnosis Date  . Blood transfusion reaction   . Diabetes mellitus   . Depression   . Hypertension   . Anemia 2012    hgb 5.5, admitted,  workup multifactorial,  b12 deficiency  . Arthritis   . Asthma   . Anemia in chronic kidney disease(285.21)   . Breast cancer July 2,2013    T1c,N0,M0. ER + PR- , HER-2/neu not overexpressing..  . Portal hypertension February 2014    CT suggested a small nodular liver, multiple varices adjacent to the spleen and inferior mediastinum and paraesophageal region. No ascites.    Past Surgical History  Procedure Laterality Date  . Breast biopsy  July 2013    [name]: wide excision   .  Colonoscopy  2012       The following portions of the patient's history were reviewed and updated as appropriate: Allergies, current medications, and problem list.    Review of Systems:   12 Pt  review of systems was negative except those addressed in the HPI,     History   Social History  . Marital Status: Legally Separated    Spouse Name: N/A    Number of Children: N/A  . Years of Education: N/A   Occupational History  . Not on file.   Social History Main Topics  . Smoking status: Former Games developer  . Smokeless tobacco: Never Used  . Alcohol Use: No  . Drug Use: No  . Sexual Activity: Not on file   Other Topics Concern  . Not on file   Social History Narrative   Son Becky Roberts     Objective:  Filed Vitals:   09/07/13 1329  BP: 140/74  Pulse: 71  Temp: 97.8 F (36.6 C)  Resp: 12     General appearance: alert, cooperative and appears stated age Ears: normal TM's and external ear canals both ears Throat: lips, mucosa, and tongue normal; teeth and gums normal Neck: no adenopathy, no carotid bruit, supple, symmetrical, trachea midline and thyroid not enlarged, symmetric, no tenderness/mass/nodules Back: symmetric, no curvature. ROM normal. No CVA tenderness. Lungs: clear  to auscultation bilaterally Heart: regular rate and rhythm, S1, S2 normal, no murmur, click, rub or gallop Abdomen: soft, non-tender; bowel sounds normal; no masses,  no organomegaly Pulses: 2+ and symmetric Skin: Skin color, texture, turgor normal. No rashes or lesions Lymph nodes: Cervical, supraclavicular, and axillary nodes normal.  Assessment and Plan:  Restless legs syndrome Controlled with ropinrole  Poorly controlled type 2 diabetes mellitus  diabetes is under poor control on current regimen, a1c is 10.0.  Patieint asked to submit a log of the last week of blood sugars .  I am interesting in fastings and 2 hour post prandials (after a meal)along with a brief description of the meal  content related to the particular blood sugars.    Obesity (BMI 30-39.9) I have addressed  BMI and recommended wt loss of 10% of body weight over the next 6 months using a low glycemic index diet and regular exercise a minimum of 5 days per week.    Hypertension Well controlled on current regimen. Renal function stable, no changes today.  Lab Results  Component Value Date   CREATININE 1.3* 09/07/2013     Chronic cholecystitis with calculus Recurrent nausea attributed to GB disease but is likely multifactorial and may include gastroparesis.  she is not a candidate for surgery until her DM is better controlled.   Noncompliance of patient with dietary regimen Diet reviewd and is non diabetic due to use of frosted cereals and regular sodas.   A total of 40 minutes was spent with patient more than half of which was spent in counseling, reviewing records from other prviders and coordination of care.  Updated Medication List Outpatient Encounter Prescriptions as of 09/07/2013  Medication Sig  . albuterol (PROVENTIL HFA;VENTOLIN HFA) 108 (90 BASE) MCG/ACT inhaler Inhale 2 puffs into the lungs every 6 (six) hours as needed for wheezing.  . Calcium Carbonate-Vitamin D (CALCARB 600/D) 600-400 MG-UNIT per tablet Take 2 tablets by mouth daily.  . carboxymethylcellulose (REFRESH) 1 % ophthalmic solution 1 drop as needed.  . chlorhexidine (PERIDEX) 0.12 % solution Use as directed 15 mLs in the mouth or throat 2 (two) times daily.  . Cholecalciferol (VITAMIN D PO) Take 1.25 mg by mouth once a week.  . cyanocobalamin (,VITAMIN B-12,) 1000 MCG/ML injection Inject 1 mL (1,000 mcg total) into the muscle every 30 (thirty) days.  . cyanocobalamin (,VITAMIN B-12,) 1000 MCG/ML injection INJECT 1 ML INTO THE MUSCLE EVERY 30 DAYS.  Marland Kitchen diazepam (VALIUM) 5 MG tablet TAKE ONE (1) TABLET THREE (3) TIMES EACH DAY  . exemestane (AROMASIN) 25 MG tablet Take 25 mg by mouth daily after breakfast.  .  HYDROcodone-acetaminophen (NORCO) 10-325 MG per tablet Take 1 tablet by mouth every 6 (six) hours as needed.  . insulin aspart protamine- aspart (NOVOLOG MIX 70/30) (70-30) 100 UNIT/ML injection Inject 0.38 mLs (38 Units total) into the skin daily with breakfast. and 40 units with dinner  . insulin detemir (LEVEMIR) 100 UNIT/ML injection 16 to 20 units daily  . Insulin Syringe-Needle U-100 (SURE COMFORT INSULIN SYRINGE) 31G X 5/16" 1 ML MISC Test blood sugars two times daily.  Marland Kitchen lisinopril (PRINIVIL,ZESTRIL) 40 MG tablet Take 1 tablet (40 mg total) by mouth daily.  . metoCLOPramide (REGLAN) 10 MG tablet Take one tablet by mouth 30 minutes before meals  . metoprolol tartrate (LOPRESSOR) 25 MG tablet Take 1 tablet (25 mg total) by mouth 2 (two) times daily.  Marland Kitchen omeprazole (PRILOSEC) 20 MG capsule Take 1 capsule (20 mg total)  by mouth daily.  . ondansetron (ZOFRAN) 4 MG tablet Take 1 tablet (4 mg total) by mouth every 8 (eight) hours as needed.  Bertram Gala Glycol-Propyl Glycol (SYSTANE OP) Apply to eye as needed.  Marland Kitchen rOPINIRole (REQUIP) 0.5 MG tablet TAKE ONE TABLET ONE TO THREE HOURS BEFORE BEDTIME. INCREASE DOSE WEEKLY AS NEEDED  . simvastatin (ZOCOR) 40 MG tablet TAKE ONE (1) TABLET EACH DAY  . SYRINGE-NEEDLE, DISP, 3 ML (B-D SYRINGE/NEEDLE 3CC/25GX5/8) 25G X 5/8" 3 ML MISC Use once a day  . Syringe/Needle, Disp, (SYRINGE LUER LOCK) 25G X 5/8" 3 ML MISC 1 application by Does not apply route every 30 (thirty) days.  . [DISCONTINUED] HYDROcodone-acetaminophen (NORCO) 10-325 MG per tablet Take 1 tablet by mouth every 6 (six) hours as needed for pain.  Marland Kitchen lactulose, encephalopathy, (CHRONULAC) 10 GM/15ML SOLN Take 45 mLs (30 g total) by mouth daily as needed.  . TDaP (BOOSTRIX) 5-2.5-18.5 LF-MCG/0.5 injection Inject 0.5 mLs into the muscle once.  . Vitamin D, Ergocalciferol, (DRISDOL) 50000 UNITS CAPS capsule TAKE ONE CAPSULE EVERY WEEK  . [DISCONTINUED] Cephalexin 500 MG tablet Take 1 tablet (500 mg  total) by mouth 3 (three) times daily.  . [DISCONTINUED] levofloxacin (LEVAQUIN) 500 MG tablet Take 1 tablet (500 mg total) by mouth daily.

## 2013-09-07 NOTE — Assessment & Plan Note (Signed)
Recurrent nausea attributed to GB disease but is likely multifactorial and may include gastroparesis.  she is not a candidate for surgery until her DM is better controlled.    

## 2013-09-07 NOTE — Patient Instructions (Signed)
You have too much sugar in your diet .  You need to cut out the frosted shredded wheat and the regular sodas.   Please resume sending me your blood sugar readings every 2 weeks as you were doing   Return in 3 months  Use the Zofran for nausea

## 2013-09-07 NOTE — Assessment & Plan Note (Signed)
I have addressed  BMI and recommended wt loss of 10% of body weight over the next 6 months using a low glycemic index diet and regular exercise a minimum of 5 days per week.   

## 2013-09-08 ENCOUNTER — Encounter: Payer: Self-pay | Admitting: Internal Medicine

## 2013-09-10 NOTE — Telephone Encounter (Signed)
Mailed unread message to pt  

## 2013-09-21 ENCOUNTER — Other Ambulatory Visit: Payer: Self-pay | Admitting: Internal Medicine

## 2013-09-30 ENCOUNTER — Encounter: Payer: Self-pay | Admitting: Internal Medicine

## 2013-10-01 ENCOUNTER — Encounter: Payer: Self-pay | Admitting: Internal Medicine

## 2013-10-02 ENCOUNTER — Other Ambulatory Visit: Payer: Self-pay | Admitting: *Deleted

## 2013-10-02 MED ORDER — LEVOFLOXACIN 500 MG PO TABS
500.0000 mg | ORAL_TABLET | Freq: Every day | ORAL | Status: DC
Start: 2013-10-02 — End: 2013-12-09

## 2013-10-02 MED ORDER — HYDROCODONE-ACETAMINOPHEN 10-325 MG PO TABS: 1.0000 | ORAL_TABLET | Freq: Four times a day (QID) | ORAL | Status: DC | PRN

## 2013-10-02 MED ORDER — DIAZEPAM 5 MG PO TABS: ORAL_TABLET | ORAL | Status: DC

## 2013-10-02 NOTE — Telephone Encounter (Signed)
Son walked in to request an Abx for his mom. He thinks that she may have pneumonia. Also needs a refill on her Valium & Norco. Per Dr. Bernardo Heater in Rx for Levaquin 500mg  QD x 7days & refilled Valium & Norco. Son also stopped at front desk to schedule a f/u with Dr. Derrel Nip

## 2013-10-05 ENCOUNTER — Ambulatory Visit: Payer: Medicare HMO | Admitting: Internal Medicine

## 2013-10-16 ENCOUNTER — Other Ambulatory Visit: Payer: Self-pay | Admitting: Cardiovascular Disease

## 2013-10-19 ENCOUNTER — Encounter: Payer: Self-pay | Admitting: Internal Medicine

## 2013-10-20 MED ORDER — PROMETHAZINE HCL 12.5 MG PO TABS: 12.5000 mg | ORAL_TABLET | Freq: Three times a day (TID) | ORAL | Status: DC | PRN

## 2013-10-20 NOTE — Telephone Encounter (Signed)
Please make an appointment

## 2013-10-21 ENCOUNTER — Telehealth: Payer: Self-pay

## 2013-10-21 ENCOUNTER — Telehealth: Payer: Self-pay | Admitting: *Deleted

## 2013-10-21 MED ORDER — METOPROLOL TARTRATE 25 MG PO TABS: ORAL_TABLET | ORAL | Status: DC

## 2013-10-21 NOTE — Telephone Encounter (Signed)
Attempted to contact Becky Roberts to talk to him about pt's meds.   On answering, he was difficult to understand, as his phone was breaking up. Asked pt to call back from another number.

## 2013-10-21 NOTE — Telephone Encounter (Signed)
Spoke w/ Becky Roberts.  Advised him that I have reviewed pt's chart both in [facility] and at [facility] and can find no record of pt's increase in metoprolol dose. Becky Roberts reports that "the ambulance people told her it would be a good idea to take more metoprolol and that's the way my sister has been giving it to her". On further investigation, Becky Roberts admits that 911 was called "about a month ago" because pt's BP was elevated.  He states that EMS advised pt to increase metoprolol to bring BP down at that time.  He states that his sister probably misunderstood and that she has been giving pt 2 pills in am and 2 pills in pm since that time, and he was "wondering why she ran out of medicine so fast", but the home health nurse said her BP was great. Advised pt that on last ov w/ Dr. Acie Fredrickson, specific mention was made that pt was doing really well on current meds and no changes would be made for BP meds. Therefore, advised pt to go back to prescribed dose of metoprolol tartrate 25 mg bid and monitor pt.  If pt needs any adjustments in her medications, we can schedule her an appt to see Dr. Acie Fredrickson, as only her physicians can make those changes. Becky Roberts verbalizes understanding and will contact the office if his mother's BP starts to climb.

## 2013-10-21 NOTE — Telephone Encounter (Signed)
Spoke with pt son and he mentioned that his mother is needing a new Rx for metoprolol tartrate sent to [place] because she had went to [facility] about 1 month 1/2 ago and she was told to take Metoprolol tartrate 25 mg 2 tablets twice daily. Pt is requesting new Rx for Metoprolol Tartrate 50 mg BID. I went to check discharge from [facility] no record found that has recently visited [facility] found or no previous record of change. Pt is aware that I am unable to send in new Rx without Dr. Consent or change. Please advise.

## 2013-10-21 NOTE — Telephone Encounter (Signed)
Pharmacist states Lopressor 50 mg twice a day. Please call and verify

## 2013-10-22 ENCOUNTER — Other Ambulatory Visit: Payer: Self-pay | Admitting: *Deleted

## 2013-10-22 MED ORDER — METOPROLOL TARTRATE 25 MG PO TABS: ORAL_TABLET | ORAL | Status: DC

## 2013-10-22 NOTE — Telephone Encounter (Signed)
Requested Prescriptions   Signed Prescriptions Disp Refills  . metoprolol tartrate (LOPRESSOR) 25 MG tablet 180 tablet 1    Sig: TAKE ONE TABLET TWICE DAILY    Authorizing Provider: Thayer Headings    Ordering User: Britt Bottom

## 2013-10-26 ENCOUNTER — Other Ambulatory Visit: Payer: Self-pay | Admitting: Internal Medicine

## 2013-10-26 ENCOUNTER — Encounter: Payer: Self-pay | Admitting: Internal Medicine

## 2013-10-26 NOTE — Telephone Encounter (Signed)
Please advise mychart message request for:  Hydrocodone last rx was done on 10-02-13 quantity 120 with 0 refills  Valium last RX was 10-02-13 quantity 90 with 0 refills

## 2013-10-28 MED ORDER — HYDROCODONE-ACETAMINOPHEN 10-325 MG PO TABS: 1.0000 | ORAL_TABLET | Freq: Four times a day (QID) | ORAL | Status: DC | PRN

## 2013-10-28 MED ORDER — DIAZEPAM 5 MG PO TABS: ORAL_TABLET | ORAL | Status: DC

## 2013-10-28 NOTE — Telephone Encounter (Signed)
Ok to refill,  printed rx  

## 2013-11-10 ENCOUNTER — Other Ambulatory Visit: Payer: Self-pay | Admitting: Internal Medicine

## 2013-11-10 NOTE — Telephone Encounter (Signed)
Last OV 09/07/13, Ok to refill?

## 2013-11-12 ENCOUNTER — Telehealth: Payer: Self-pay | Admitting: Internal Medicine

## 2013-11-12 NOTE — Telephone Encounter (Signed)
Joe pt son went to [facility] locations to get the prep for ms Osowski ct scheduled for tomorrow.  Both locations stated she was not schedule please advise

## 2013-11-12 NOTE — Telephone Encounter (Signed)
Pt is not scheduled for CT. The paper they have found is from last year. Patients son is aware.

## 2013-11-17 ENCOUNTER — Other Ambulatory Visit: Payer: Self-pay | Admitting: Internal Medicine

## 2013-11-20 ENCOUNTER — Telehealth: Payer: Self-pay | Admitting: Internal Medicine

## 2013-11-20 MED ORDER — HYDROCODONE-ACETAMINOPHEN 10-325 MG PO TABS: 1.0000 | ORAL_TABLET | Freq: Four times a day (QID) | ORAL | Status: DC | PRN

## 2013-11-20 MED ORDER — DIAZEPAM 5 MG PO TABS: ORAL_TABLET | ORAL | Status: DC

## 2013-11-20 NOTE — Telephone Encounter (Signed)
Ok to refill,  printed rx  

## 2013-11-20 NOTE — Telephone Encounter (Signed)
diazepam (VALIUM) 5 MG tablet   HYDROcodone-acetaminophen (NORCO) 10-325 MG per tablet

## 2013-11-20 NOTE — Telephone Encounter (Signed)
Last OV 09/07/13 okay to fill?

## 2013-11-22 ENCOUNTER — Encounter: Payer: Self-pay | Admitting: Internal Medicine

## 2013-11-23 NOTE — Telephone Encounter (Signed)
Patient notified and script in Narc box needs UDS and Narcotic agreement.

## 2013-12-08 ENCOUNTER — Ambulatory Visit (INDEPENDENT_AMBULATORY_CARE_PROVIDER_SITE_OTHER): Payer: Medicare HMO | Admitting: Internal Medicine

## 2013-12-08 ENCOUNTER — Encounter: Payer: Self-pay | Admitting: Internal Medicine

## 2013-12-08 VITALS — BP 150/66 | HR 66 | Temp 97.9°F | Resp 16 | Wt 195.0 lb

## 2013-12-08 DIAGNOSIS — M1990 Unspecified osteoarthritis, unspecified site: Secondary | ICD-10-CM

## 2013-12-08 DIAGNOSIS — Z9119 Patient's noncompliance with other medical treatment and regimen: Secondary | ICD-10-CM

## 2013-12-08 DIAGNOSIS — Z9111 Patient's noncompliance with dietary regimen: Secondary | ICD-10-CM

## 2013-12-08 DIAGNOSIS — Z91199 Patient's noncompliance with other medical treatment and regimen due to unspecified reason: Secondary | ICD-10-CM

## 2013-12-08 DIAGNOSIS — K8010 Calculus of gallbladder with chronic cholecystitis without obstruction: Secondary | ICD-10-CM

## 2013-12-08 DIAGNOSIS — R5383 Other fatigue: Secondary | ICD-10-CM

## 2013-12-08 DIAGNOSIS — M129 Arthropathy, unspecified: Secondary | ICD-10-CM

## 2013-12-08 DIAGNOSIS — R5381 Other malaise: Secondary | ICD-10-CM

## 2013-12-08 DIAGNOSIS — R350 Frequency of micturition: Secondary | ICD-10-CM

## 2013-12-08 DIAGNOSIS — R531 Weakness: Secondary | ICD-10-CM

## 2013-12-08 DIAGNOSIS — Z91119 Patient's noncompliance with dietary regimen due to unspecified reason: Secondary | ICD-10-CM

## 2013-12-08 DIAGNOSIS — I10 Essential (primary) hypertension: Secondary | ICD-10-CM

## 2013-12-08 DIAGNOSIS — E1165 Type 2 diabetes mellitus with hyperglycemia: Secondary | ICD-10-CM

## 2013-12-08 DIAGNOSIS — E119 Type 2 diabetes mellitus without complications: Secondary | ICD-10-CM

## 2013-12-08 LAB — COMPREHENSIVE METABOLIC PANEL
ALT: 29 U/L (ref 0–35)
AST: 32 U/L (ref 0–37)
Albumin: 3.1 g/dL — ABNORMAL LOW (ref 3.5–5.2)
Alkaline Phosphatase: 98 U/L (ref 39–117)
BUN: 15 mg/dL (ref 6–23)
CO2: 28 meq/L (ref 19–32)
Calcium: 9.3 mg/dL (ref 8.4–10.5)
Chloride: 109 mEq/L (ref 96–112)
Creatinine, Ser: 1.5 mg/dL — ABNORMAL HIGH (ref 0.4–1.2)
GFR: 35.79 mL/min — ABNORMAL LOW (ref >60.00)
GLUCOSE: 234 mg/dL — AB (ref 70–99)
Potassium: 4.3 mEq/L (ref 3.5–5.1)
Sodium: 142 mEq/L (ref 135–145)
Total Bilirubin: 0.9 mg/dL (ref 0.3–1.2)
Total Protein: 6.4 g/dL (ref 6.0–8.3)

## 2013-12-08 LAB — POCT URINALYSIS DIPSTICK
Bilirubin, UA: NEGATIVE
Blood, UA: NEGATIVE
Glucose, UA: 100
Ketones, UA: NEGATIVE
Leukocytes, UA: NEGATIVE
Nitrite, UA: NEGATIVE
PROTEIN UA: NEGATIVE
Spec Grav, UA: 1.025
UROBILINOGEN UA: 1
pH, UA: 6

## 2013-12-08 LAB — CBC WITH DIFFERENTIAL/PLATELET
BASOS ABS: 0 10*3/uL (ref 0.0–0.1)
Basophils Relative: 0.7 % (ref 0.0–3.0)
EOS ABS: 0.1 10*3/uL (ref 0.0–0.7)
Eosinophils Relative: 2.3 % (ref 0.0–5.0)
HCT: 37.5 % (ref 36.0–46.0)
Hemoglobin: 12.6 g/dL (ref 12.0–15.0)
LYMPHS ABS: 1.5 10*3/uL (ref 0.7–4.0)
Lymphocytes Relative: 28 % (ref 12.0–46.0)
MCHC: 33.6 g/dL (ref 30.0–36.0)
MCV: 98.2 fl (ref 78.0–100.0)
Monocytes Absolute: 0.5 10*3/uL (ref 0.1–1.0)
Monocytes Relative: 9.2 % (ref 3.0–12.0)
NEUTROS PCT: 59.8 % (ref 43.0–77.0)
Neutro Abs: 3.2 10*3/uL (ref 1.4–7.7)
PLATELETS: 112 10*3/uL — AB (ref 150.0–400.0)
RBC: 3.82 Mil/uL — ABNORMAL LOW (ref 3.87–5.11)
RDW: 13.8 % (ref 11.5–14.6)
WBC: 5.4 10*3/uL (ref 4.5–10.5)

## 2013-12-08 LAB — HEMOGLOBIN A1C: HEMOGLOBIN A1C: 8.2 % — AB (ref 4.6–6.5)

## 2013-12-08 LAB — LDL CHOLESTEROL, DIRECT: Direct LDL: 70.3 mg/dL

## 2013-12-08 LAB — TSH: TSH: 1.75 u[IU]/mL (ref 0.35–5.50)

## 2013-12-08 MED ORDER — LOSARTAN POTASSIUM 100 MG PO TABS: 100.0000 mg | ORAL_TABLET | Freq: Every day | ORAL | Status: DC

## 2013-12-08 NOTE — Progress Notes (Signed)
Pre-visit discussion using our clinic review tool. No additional management support is needed unless otherwise documented below in the visit note.  

## 2013-12-08 NOTE — Assessment & Plan Note (Addendum)
Elevated today.  Changing lisinopril to losartan for improved conrol.

## 2013-12-08 NOTE — Patient Instructions (Addendum)
Increase the mixed insulin to 45 units before supper and continue 38 units in the morning   We will send Physical Therapy out to evaluate your home and try to get you a hospital bed if it can be done   You cannot eat biscuits every day !! They have too many carbohydrates .    "Nabs"  Are NOT for diabetics. Neither are biscuits and Cadbury eggs.   We are stopping the lisinopril and adding losartan for your blood pressure .  Continue the metoprolol twice daily   Dannon Lt n Fit Greek Yogurt  Or  [employer] brand is also low sugar 9 carbs or less .  YOu can eat this daily !!

## 2013-12-08 NOTE — Progress Notes (Signed)
Patient ID: Becky Roberts, female   DOB: [birth year], 80 y.o.   MRN: [id number]  Patient Active Problem List   Diagnosis Date Noted  . Noncompliance of patient with dietary regimen 09/07/2013  . Breast screening 08/17/2013  . History of breast cancer 08/17/2013  . Dental infection 05/02/2013  . Restless legs syndrome 01/21/2013  . Chronic diastolic congestive heart failure 12/03/2012  . Chronic cholecystitis with calculus 11/05/2012  . Obesity (BMI 30-39.9) 04/17/2012  . Poorly controlled type 2 diabetes mellitus   . Depression   . Hypertension   . Arthritis   . Asthma   . Breast cancer 03/31/2012  . Generalized muscle weakness 03/06/2012  . Breast cancer 03/05/2012    Subjective:  CC:   Chief Complaint  Patient presents with  . Follow-up    incontinance  . Diabetes    HPI:   Becky Roberts is a 79 y.o. female who presents for  follow up on poorly controlled DM,  HTN, chronic pain, recurrent nausea and generalized weakness secondary to chronic illness and arthritis.  She report that she is getting progressively weaker.  She has difficulty rising from a seated position without assistance,  Her son has bought her a rolling walker with a seat which she is dependent on for ambulating.  She is sleeping on er daughter's living room couch because the mattress hurts her back..  She had a near fall on the way here.She is requesting a hospital bed bc she has trouble getting off of the couch and has difficult sitting upright without falling over.  She is fearful of walking down the hallway at night . Living with dtr right now because Becky Roberts (son) and his girlfriend are having problems.    [employer] called Becky Roberts in [name].    DM:  Recent loss of control by A1c noted in January.  Becky Roberts has been checking her blood sugars more often and reports that the fastings  and mid day are < 150.Marland Kitchen  Post prandial sugars in the evening are 240 to 350.  Diet reviewed,  Easts a  fast food egg and cheese biscuit for breakfast nearly daily,  And eats Cadbury eggs and Nabs crackers every afternoon. 70/30 insulin doses are 38 units qam and 40 units qpm before meals, and 20 units Levemir   Past Medical History  Diagnosis Date  . Blood transfusion reaction   . Diabetes mellitus   . Depression   . Hypertension   . Anemia 2012    hgb 5.5, admitted,  workup multifactorial,  b12 deficiency  . Arthritis   . Asthma   . Anemia in chronic kidney disease(285.21)   . Breast cancer July 2,2013    T1c,N0,M0. ER + PR- , HER-2/neu not overexpressing..  . Portal hypertension February 2014    CT suggested a small nodular liver, multiple varices adjacent to the spleen and inferior mediastinum and paraesophageal region. No ascites.    Past Surgical History  Procedure Laterality Date  . Breast biopsy  July 2013    [name]: wide excision   . Colonoscopy  2012       The following portions of the patient's history were reviewed and updated as appropriate: Allergies, current medications, and problem list.    Review of Systems:   Patient denies headache, fevers, malaise, unintentional weight loss, skin rash, eye pain, sinus congestion and sinus pain, sore throat, dysphagia,  hemoptysis , cough, dyspnea, wheezing, chest pain, palpitations, orthopnea, edema, abdominal pain, nausea, melena, diarrhea,  constipation, flank pain, dysuria, hematuria, urinary  Frequency, nocturia, numbness, tingling, seizures,  Focal weakness, Loss of consciousness,  Tremor, insomnia, depression, anxiety, and suicidal ideation.     History   Social History  . Marital Status: Legally Separated    Spouse Name: N/A    Number of Children: N/A  . Years of Education: N/A   Occupational History  . Not on file.   Social History Main Topics  . Smoking status: Former Research scientist (life sciences)  . Smokeless tobacco: Never Used  . Alcohol Use: No  . Drug Use: No  . Sexual Activity: Not on file   Other Topics Concern  .  Not on file   Social History Narrative   Son Becky Roberts     Objective:  Filed Vitals:   12/08/13 1213  BP: 150/66  Pulse: 66  Temp: 97.9 F (36.6 C)  Resp: 16     General appearance: alert, cooperative and appears stated age Ears: normal TM's and external ear canals both ears Throat: lips, mucosa, and tongue normal; teeth and gums normal Neck: no adenopathy, no carotid bruit, supple, symmetrical, trachea midline and thyroid not enlarged, symmetric, no tenderness/mass/nodules Back: symmetric, no curvature. ROM normal. No CVA tenderness. Lungs: clear to auscultation bilaterally Heart: regular rate and rhythm, S1, S2 normal, no murmur, click, rub or gallop Abdomen: soft, non-tender; bowel sounds normal; no masses,  no organomegaly Pulses: 2+ and symmetric Skin: Skin color, texture, turgor normal. No rashes or lesions Lymph nodes: Cervical, supraclavicular, and axillary nodes normal.  Assessment and Plan:  Hypertension Elevated today.  Changing lisinopril to losartan for improved conrol.   Poorly controlled type 2 diabetes mellitus Diet reviewed with patient and advised to avoid biscuits, Nabs crackers and chocolate eggs.  evening dose of 70/30 increased to 45 units. Lisinopril changed to losartan for better bp control. Next A1c due on or after April 15  Noncompliance of patient with dietary regimen Diet reviewed again with patient and son .  Referral offered to diabetic classes and deferred.   Chronic cholecystitis with calculus Recurrent nausea attributed to GB disease but is likely multifactorial and may include gastroparesis.  she is not a candidate for surgery until her DM is better controlled.     Arthritis With chronic pain, intolerant of NSAIDs due to nausea and CKD.  Continue vicodin prn.    Updated Medication List Outpatient Encounter Prescriptions as of 12/08/2013  Medication Sig  . albuterol (PROVENTIL HFA;VENTOLIN HFA) 108 (90 BASE) MCG/ACT inhaler Inhale 2 puffs  into the lungs every 6 (six) hours as needed for wheezing.  . Calcium Carbonate-Vitamin D (CALCARB 600/D) 600-400 MG-UNIT per tablet Take 2 tablets by mouth daily.  . carboxymethylcellulose (REFRESH) 1 % ophthalmic solution 1 drop as needed.  . Cholecalciferol (VITAMIN D PO) Take 1.25 mg by mouth once a week.  . cyanocobalamin (,VITAMIN B-12,) 1000 MCG/ML injection Inject 1 mL (1,000 mcg total) into the muscle every 30 (thirty) days.  . cyanocobalamin (,VITAMIN B-12,) 1000 MCG/ML injection INJECT 1 ML INTO THE MUSCLE EVERY 30 DAYS.  Marland Kitchen diazepam (VALIUM) 5 MG tablet TAKE ONE (1) TABLET THREE (3) TIMES EACH DAY  . exemestane (AROMASIN) 25 MG tablet Take 25 mg by mouth daily after breakfast.  . HYDROcodone-acetaminophen (NORCO) 10-325 MG per tablet Take 1 tablet by mouth every 6 (six) hours as needed.  . insulin aspart protamine- aspart (NOVOLOG MIX 70/30) (70-30) 100 UNIT/ML injection 38 units with breakfast and 40 units with supper  . insulin detemir (LEVEMIR) 100  UNIT/ML injection 16 to 20 units daily  . Insulin Syringe-Needle U-100 31G X 5/16" 1 ML MISC TEST BLOOD SUGARS TWO TIMES DAILY.  Marland Kitchen metoCLOPramide (REGLAN) 10 MG tablet Take one tablet by mouth 30 minutes before meals  . metoprolol tartrate (LOPRESSOR) 25 MG tablet TAKE ONE TABLET TWICE DAILY  . Polyethyl Glycol-Propyl Glycol (SYSTANE OP) Apply to eye as needed.  . promethazine (PHENERGAN) 12.5 MG tablet Take 1 tablet (12.5 mg total) by mouth every 8 (eight) hours as needed for nausea or vomiting.  Marland Kitchen rOPINIRole (REQUIP) 0.5 MG tablet TAKE ONE TABLET ONE TO THREE HOURS BEFORE BEDTIME. INCREASE DOSE WEEKLY AS NEEDED  . simvastatin (ZOCOR) 40 MG tablet TAKE ONE (1) TABLET EACH DAY  . SYRINGE-NEEDLE, DISP, 3 ML (B-D SYRINGE/NEEDLE 3CC/25GX5/8) 25G X 5/8" 3 ML MISC Use once a day  . Syringe/Needle, Disp, (SYRINGE LUER LOCK) 25G X 5/8" 3 ML MISC 1 application by Does not apply route every 30 (thirty) days.  . Vitamin D, Ergocalciferol, (DRISDOL)  50000 UNITS CAPS capsule TAKE ONE CAPSULE EVERY WEEK  . [DISCONTINUED] lisinopril (PRINIVIL,ZESTRIL) 40 MG tablet TAKE ONE (1) TABLET EACH DAY  . chlorhexidine (PERIDEX) 0.12 % solution Use as directed 15 mLs in the mouth or throat 2 (two) times daily.  Marland Kitchen losartan (COZAAR) 100 MG tablet Take 1 tablet (100 mg total) by mouth daily.  Marland Kitchen omeprazole (PRILOSEC) 20 MG capsule TAKE ONE (1) CAPSULE EACH DAY  . ondansetron (ZOFRAN) 4 MG tablet Take 1 tablet (4 mg total) by mouth every 8 (eight) hours as needed.  . TDaP (BOOSTRIX) 5-2.5-18.5 LF-MCG/0.5 injection Inject 0.5 mLs into the muscle once.  . [DISCONTINUED] lactulose, encephalopathy, (CHRONULAC) 10 GM/15ML SOLN Take 45 mLs (30 g total) by mouth daily as needed.  . [DISCONTINUED] levofloxacin (LEVAQUIN) 500 MG tablet Take 1 tablet (500 mg total) by mouth daily.     Orders Placed This Encounter  Procedures  . Comprehensive metabolic panel  . CBC with Differential  . TSH  . Hemoglobin A1c  . LDL cholesterol, direct  . Ambulatory referral to Ophthalmology  . Ambulatory referral to Home Health  . POCT Urinalysis Dipstick    Return in about 4 weeks (around 01/05/2014).

## 2013-12-09 ENCOUNTER — Encounter: Payer: Self-pay | Admitting: Internal Medicine

## 2013-12-09 ENCOUNTER — Telehealth: Payer: Self-pay | Admitting: Internal Medicine

## 2013-12-09 NOTE — Assessment & Plan Note (Signed)
Diet reviewed again with patient and son .  Referral offered to diabetic classes and deferred.

## 2013-12-09 NOTE — Assessment & Plan Note (Signed)
Recurrent nausea attributed to GB disease but is likely multifactorial and may include gastroparesis.  she is not a candidate for surgery until her DM is better controlled.

## 2013-12-09 NOTE — Assessment & Plan Note (Signed)
With chronic pain, intolerant of NSAIDs due to nausea and CKD.  Continue vicodin prn.

## 2013-12-09 NOTE — Telephone Encounter (Signed)
Relevant patient education assigned to patient using Emmi. ° °

## 2013-12-09 NOTE — Assessment & Plan Note (Addendum)
Diet reviewed with patient and advised to avoid biscuits, Nabs crackers and chocolate eggs.  evening dose of 70/30 increased to 45 units. Lisinopril changed to losartan for better bp control. Next A1c due on or after April 15

## 2013-12-10 NOTE — Telephone Encounter (Signed)
Mailed unread message to pt  

## 2013-12-11 ENCOUNTER — Other Ambulatory Visit: Payer: Self-pay | Admitting: Internal Medicine

## 2013-12-11 ENCOUNTER — Telehealth: Payer: Self-pay | Admitting: Internal Medicine

## 2013-12-11 MED ORDER — DIAZEPAM 5 MG PO TABS: ORAL_TABLET | ORAL | Status: DC

## 2013-12-11 MED ORDER — PROMETHAZINE HCL 12.5 MG PO TABS: 12.5000 mg | ORAL_TABLET | Freq: Three times a day (TID) | ORAL | Status: DC | PRN

## 2013-12-11 MED ORDER — HYDROCODONE-ACETAMINOPHEN 10-325 MG PO TABS: 1.0000 | ORAL_TABLET | Freq: Four times a day (QID) | ORAL | Status: DC | PRN

## 2013-12-11 NOTE — Telephone Encounter (Signed)
Patient notified script ready for pick up. Placed upfront son will pickup and this is ok.

## 2013-12-11 NOTE — Telephone Encounter (Signed)
Son called to see if diazepam, phenergan and hydrocodone are ready for pickup.  Please call when ready for pickup.

## 2013-12-11 NOTE — Telephone Encounter (Signed)
Yes

## 2013-12-11 NOTE — Telephone Encounter (Signed)
Patient in office on 12/08/13 okay to refill?

## 2013-12-14 ENCOUNTER — Other Ambulatory Visit: Payer: Self-pay | Admitting: Internal Medicine

## 2013-12-14 NOTE — Telephone Encounter (Signed)
Refill

## 2013-12-15 ENCOUNTER — Telehealth: Payer: Self-pay

## 2013-12-15 NOTE — Telephone Encounter (Signed)
Relevant patient education assigned to patient using Emmi. ° °

## 2013-12-23 ENCOUNTER — Telehealth: Payer: Self-pay | Admitting: *Deleted

## 2013-12-23 NOTE — Telephone Encounter (Signed)
Patient called for refill on Hydrocodone and Lorazepam Medication were last filled on 12/11/13  Advised patient to early she says she is having problems with son is now living with daughter and son is not to pick up scripts? Please advise.

## 2013-12-23 NOTE — Telephone Encounter (Signed)
Something sounds fishy, no refills until April 13

## 2013-12-24 ENCOUNTER — Telehealth: Payer: Self-pay | Admitting: Internal Medicine

## 2013-12-24 NOTE — Telephone Encounter (Signed)
States pt has abscessed tooth and is asking for an antibiotic.

## 2013-12-24 NOTE — Telephone Encounter (Signed)
Left message for patient to call office.  

## 2013-12-24 NOTE — Telephone Encounter (Signed)
I do not treat abscessed tooth, she needs to see her dentist.

## 2013-12-24 NOTE — Telephone Encounter (Signed)
The Mother is living with daughter reported by patient on 12/23/13 Have left message for patient to call office. FYI

## 2013-12-30 NOTE — Telephone Encounter (Signed)
Pt son called back Wille Glaser  [phone]  Joe stated ms Collett didn't understand what you were talking about when you called her friday

## 2013-12-31 NOTE — Telephone Encounter (Signed)
Patient caregiver notified

## 2014-01-04 ENCOUNTER — Ambulatory Visit: Payer: Medicare HMO | Admitting: Internal Medicine

## 2014-01-11 ENCOUNTER — Telehealth: Payer: Self-pay | Admitting: Internal Medicine

## 2014-01-11 ENCOUNTER — Other Ambulatory Visit: Payer: Self-pay | Admitting: *Deleted

## 2014-01-11 DIAGNOSIS — G894 Chronic pain syndrome: Secondary | ICD-10-CM

## 2014-01-11 DIAGNOSIS — R197 Diarrhea, unspecified: Secondary | ICD-10-CM

## 2014-01-11 NOTE — Telephone Encounter (Signed)
Patient Information:  Caller Name: Wille Glaser  Phone: [phone]  Patient: Becky Roberts, Becky Roberts  Gender: Female  DOB: Mar 10, 1932  Age: 80 Years  PCP: Deborra Medina (Adults only)  Office Follow Up:  Does the office need to follow up with this patient?: Yes  Instructions For The Office: Disposition is call back by pcp today;  Emergent symptoms reviewed with caller, late afternoon call.  He will f/u in am with office prior to coming in to see if Md wants ov.  RN Note:  Home care advice given.  Reviewed emergent symptoms with caller;  To report any blood in stool, fever, change in loc;  Advised at this late time of day he may not hear back from PCP today, if not f/u in am prior to coming to office to pick up her rx's because she may need office visit.  Symptoms  Reason For Call & Symptoms: Diarrhea for the past few week;  Son says since starting her newest medication which is Losartan;  Eating and drinking normally; Has about 3 episodes of watery stool per day;  No one else in the home has had any symptoms;  No vomiting and no decrease in appetite.  Reviewed Health History In EMR: Yes  Reviewed Medications In EMR: Yes  Reviewed Allergies In EMR: Yes  Reviewed Surgeries / Procedures: Yes  Date of Onset of Symptoms: 12/11/2013  Guideline(s) Used:  Diarrhea  Disposition Per Guideline:   Callback by PCP Today  Reason For Disposition Reached:   Age > 70 years  Advice Given:  Reassurance:  In healthy adults, new-onset diarrhea is usually caused by a viral infection of the intestines, which you can treat at home. Diarrhea is the body's way of getting rid of the infection. Here are some tips on how to keep ahead of the fluid losses.  Here is some care advice that should help.  Fluids:  Drink more fluids, at least 8-10 glasses (8 oz or 240 ml) daily.  For example: sports drinks, diluted fruit juices, soft drinks.  Supplement this with saltine crackers or soups to make certain that you are  getting sufficient fluid and salt to meet your body's needs.  Avoid caffeinated beverages (Reason: caffeine is mildly dehydrating).  Nutrition:  Maintaining some food intake during episodes of diarrhea is important.  Ideal initial foods include boiled starches/cereals (e.g., potatoes, rice, noodles, wheat, oats) with a small amount of salt to taste.  As your stools return to normal consistency, resume a normal diet.  Diarrhea Medication - Bismuth Subsalicylate (e.g., Kaopectate, Pepto Bismol):  Helps reduce diarrhea, vomiting, and abdominal cramping.  Adult dosage: 2 tablets or 2 tablespoons (30 ml) by mouth every hour if diarrhea continues to a maximum of 8 doses in a 24-hour period.  Do not use for more than 2 days  Diarrhea Medication  - Imodium AD:   Helps reduce diarrhea.  Adult dosage: 4 mg (2 capsules or 4 teaspoons or 20 ml) is the recommended first dose. You may take an additional 2 mg (1 capsule or 2 teaspoons or 10 ml) after each loose BM.  Maximum dosage: 16 mg (8 capsules or 16 teaspoons or 80 ml).  1 capsule = 2 mg; 1 teaspoon (5 ml) = 1 mg.  Caution: Do not use if you have a fever greater than 100F (37.8C). Do not use if there is blood or mucus in your stools. Do not use for more than 2 days.  Read all package instructions.  Patient Will  Follow Care Advice:  YES

## 2014-01-11 NOTE — Telephone Encounter (Signed)
Patient also left voicemail need refill on Norco and Valium ,  Patient has 3 bowel movements that are loose. No nausea or fever, patient CBG today 126 fasting and 2 hours post prandial. 146.

## 2014-01-11 NOTE — Telephone Encounter (Signed)
2 electronic Rx request please advise.

## 2014-01-11 NOTE — Telephone Encounter (Signed)
diazepam (VALIUM) 5 MG tablet   HYDROcodone-acetaminophen (NORCO) 10-325 MG per tablet

## 2014-01-12 MED ORDER — HYDROCODONE-ACETAMINOPHEN 10-325 MG PO TABS: 1.0000 | ORAL_TABLET | Freq: Four times a day (QID) | ORAL | Status: DC | PRN

## 2014-01-12 MED ORDER — DIAZEPAM 5 MG PO TABS: ORAL_TABLET | ORAL | Status: DC

## 2014-01-12 NOTE — Telephone Encounter (Signed)
The patient's son returned your call.

## 2014-01-12 NOTE — Telephone Encounter (Signed)
Left message for patient to return call to office. 

## 2014-01-12 NOTE — Telephone Encounter (Signed)
Patient scheduled tomorrow 9.15 am.

## 2014-01-12 NOTE — Telephone Encounter (Signed)
patient is overdue for urine drug screen.  No refills on the vicodin until she does one. I am concerned about diversion  Of hydrocodone so since she has been having watery diarrhea . Tell her she needs to come by the office to provide samples of stool and urine to rule out infection and seh can pick up the refills then,  UA, UDS and stool samples needed. I will order.

## 2014-01-12 NOTE — Telephone Encounter (Signed)
Pt son came in to pick up rx.  Told him dr Derrel Nip needed to sign and she would be in at 62.   He stated ms Heavner has fever and vomiting today Please call joe when rx is signed  [phone]

## 2014-01-13 ENCOUNTER — Ambulatory Visit (INDEPENDENT_AMBULATORY_CARE_PROVIDER_SITE_OTHER): Payer: Medicare HMO | Admitting: Internal Medicine

## 2014-01-13 ENCOUNTER — Encounter: Payer: Self-pay | Admitting: *Deleted

## 2014-01-13 VITALS — BP 138/60 | HR 75 | Temp 98.0°F | Resp 18 | Wt 191.5 lb

## 2014-01-13 DIAGNOSIS — N390 Urinary tract infection, site not specified: Secondary | ICD-10-CM

## 2014-01-13 DIAGNOSIS — G8929 Other chronic pain: Secondary | ICD-10-CM

## 2014-01-13 DIAGNOSIS — R112 Nausea with vomiting, unspecified: Secondary | ICD-10-CM

## 2014-01-13 DIAGNOSIS — R350 Frequency of micturition: Secondary | ICD-10-CM

## 2014-01-13 DIAGNOSIS — K0510 Chronic gingivitis, plaque induced: Secondary | ICD-10-CM

## 2014-01-13 DIAGNOSIS — R197 Diarrhea, unspecified: Secondary | ICD-10-CM

## 2014-01-13 DIAGNOSIS — E119 Type 2 diabetes mellitus without complications: Secondary | ICD-10-CM

## 2014-01-13 DIAGNOSIS — M6281 Muscle weakness (generalized): Secondary | ICD-10-CM

## 2014-01-13 DIAGNOSIS — K8010 Calculus of gallbladder with chronic cholecystitis without obstruction: Secondary | ICD-10-CM

## 2014-01-13 DIAGNOSIS — G894 Chronic pain syndrome: Secondary | ICD-10-CM

## 2014-01-13 DIAGNOSIS — J449 Chronic obstructive pulmonary disease, unspecified: Secondary | ICD-10-CM

## 2014-01-13 DIAGNOSIS — R1013 Epigastric pain: Secondary | ICD-10-CM

## 2014-01-13 LAB — CBC WITH DIFFERENTIAL/PLATELET
BASOS ABS: 0 10*3/uL (ref 0.0–0.1)
Basophils Relative: 0.7 % (ref 0.0–3.0)
EOS ABS: 0.1 10*3/uL (ref 0.0–0.7)
Eosinophils Relative: 1.5 % (ref 0.0–5.0)
HEMATOCRIT: 37.5 % (ref 36.0–46.0)
Hemoglobin: 12.9 g/dL (ref 12.0–15.0)
LYMPHS ABS: 1.4 10*3/uL (ref 0.7–4.0)
Lymphocytes Relative: 27.4 % (ref 12.0–46.0)
MCHC: 34.3 g/dL (ref 30.0–36.0)
MCV: 96.7 fl (ref 78.0–100.0)
MONO ABS: 0.6 10*3/uL (ref 0.1–1.0)
Monocytes Relative: 12.4 % — ABNORMAL HIGH (ref 3.0–12.0)
Neutro Abs: 2.9 10*3/uL (ref 1.4–7.7)
Neutrophils Relative %: 58 % (ref 43.0–77.0)
Platelets: 103 10*3/uL — ABNORMAL LOW (ref 150.0–400.0)
RBC: 3.88 Mil/uL (ref 3.87–5.11)
RDW: 13.5 % (ref 11.5–14.6)
WBC: 4.9 10*3/uL (ref 4.5–10.5)

## 2014-01-13 LAB — POCT URINALYSIS DIPSTICK
Bilirubin, UA: NEGATIVE
GLUCOSE UA: NEGATIVE
Ketones, UA: NEGATIVE
NITRITE UA: NEGATIVE
PROTEIN UA: NEGATIVE
SPEC GRAV UA: 1.02
Urobilinogen, UA: 0.2
pH, UA: 6

## 2014-01-13 LAB — LIPASE: Lipase: 43 U/L (ref 11.0–59.0)

## 2014-01-13 MED ORDER — CHLORHEXIDINE GLUCONATE 0.12 % MT SOLN: 15.0000 mL | Freq: Two times a day (BID) | OROMUCOSAL | Status: AC

## 2014-01-13 NOTE — Assessment & Plan Note (Addendum)
Recurrent nausea attributed to GB disease but is likely multifactorial and may include gastroparesis.  Referral to Dr Bary Castilla now that her nutritional status has improved and diabetes is better controlled

## 2014-01-13 NOTE — Patient Instructions (Signed)
Your nausea and vomiting may be from your gallbladder I am referring you to dr byrnett now that your diabetes is better controlled to discuss taking it out  You should follow a low fat diet to avoid aggravating your gallbladder   Your current cough appears to be from a viral infection.  You do not need antibiotics,  But you should continue using your inhalers ,  Drink plenty of fluids and use benadryl at night for sinus drainage.  If your urine appears infected will will call you and start an antibiotic  Resume the medicated mouth rinse twice daily for your gum infection (chlorhexidine)

## 2014-01-13 NOTE — Progress Notes (Signed)
Patient ID: Becky Roberts, female   DOB: July 02, 1932, 80 y.o.   MRN: [id number]  Patient Active Problem List   Diagnosis Date Noted  . Chronic pain syndrome 01/14/2014  . Diarrhea 01/14/2014  . Urinary frequency 01/14/2014  . Gingivitis, chronic 01/13/2014  . Noncompliance of patient with dietary regimen 09/07/2013  . History of breast cancer 08/17/2013  . Dental infection 05/02/2013  . Restless legs syndrome 01/21/2013  . Chronic diastolic congestive heart failure 12/03/2012  . Chronic cholecystitis with calculus 11/05/2012  . Obesity (BMI 30-39.9) 04/17/2012  . Type II or unspecified type diabetes mellitus without mention of complication, not stated as uncontrolled   . Depression   . Hypertension   . Arthritis   . COPD (chronic obstructive pulmonary disease)   . Breast cancer 03/31/2012  . Generalized muscle weakness 03/06/2012    Subjective:  CC:   Chief Complaint  Patient presents with  . Acute Visit    symptoms havs subsided today    HPI:   Becky Roberts is a 80 y.o. female who presents for acute visit for a multitude of acute and chronically worsening problems:  1) recent development of recurrent abdominal pain, nausea and emesis for the past week   accompanied by  loose stools daily for the past two weeks.  Denies fevers, sick contacts, and recent use of antibiotics.  Her son gave her Immodium last night and so far she has not had a loose stool in the last 12 hours.  The abdominal pain is persistent and she has been vomiting every other night and occasionally during the day,.  She has a history of chronic cholecystitis but cholecystectomy has been postponed for the past year due to uncontrolled DM. Today she is too weak to stand without assistance.  However she has gained weight this year after losing nearly 20 lbs last year.  She ate a cheeseburger yesterday,  And drinking sufficient water.    2) Dental caries and gum disease are worsening; she  has not seen a  dentist yet. Another tooth has fallen out.   3) DM: a1c is the best it has been in 2 years.  She is Seeing eye doctor on Monday.   4)Chronic pain:  She has been taking vicodin 4 daily for chronic pain secondary to  OA of knees and ankles,  and chronic back pain with history of fracture.  She wakes up every 1.5 hrs if she doesn't take one at bedtime  5) Urinary frequency and incontinence without the sensation of needing to void.  Occurs intermittently.  Has had some fecal  Incontinence for the past several weeks. Not sure it has happened with formed stools.    6) Respiratory symptoms: states she is running a fever, coughing ,  Headache started yesterday.,  temp was 100.3  Yesterday morning,  Was normal temp last night .  Not wheezing.    Past Medical History  Diagnosis Date  . Blood transfusion reaction   . Diabetes mellitus   . Depression   . Hypertension   . Anemia 2012    hgb 5.5, admitted,  workup multifactorial,  b12 deficiency  . Arthritis   . Asthma   . Anemia in chronic kidney disease(285.21)   . Breast cancer July 2,2013    T1c,N0,M0. ER + PR- , HER-2/neu not overexpressing..  . Portal hypertension February 2014    CT suggested a small nodular liver, multiple varices adjacent to the spleen and inferior mediastinum and paraesophageal region. No ascites.  Past Surgical History  Procedure Laterality Date  . Breast biopsy  July 2013    [name]: wide excision   . Colonoscopy  2012    The following portions of the patient's history were reviewed and updated as appropriate: Allergies, current medications, and problem list.    Review of Systems:   Patient denies headache, fevers, malaise, unintentional weight loss, skin rash, eye pain, sinus congestion and sinus pain, sore throat, dysphagia,  hemoptysis , cough, dyspnea, wheezing, chest pain, palpitations, orthopnea, edema, abdominal pain, nausea, melena, diarrhea, constipation, flank pain, dysuria, hematuria, urinary   Frequency, nocturia, numbness, tingling, seizures,  Focal weakness, Loss of consciousness,  Tremor, insomnia, depression, anxiety, and suicidal ideation.     History   Social History  . Marital Status: Legally Separated    Spouse Name: N/A    Number of Children: N/A  . Years of Education: N/A   Occupational History  . Not on file.   Social History Main Topics  . Smoking status: Former Research scientist (life sciences)  . Smokeless tobacco: Never Used  . Alcohol Use: No  . Drug Use: No  . Sexual Activity: Not on file   Other Topics Concern  . Not on file   Social History Narrative   Son Wille Glaser     Objective:  Filed Vitals:   01/13/14 0941  BP: 138/60  Pulse: 75  Temp: 98 F (36.7 C)  Resp: 18     General appearance: alert, cooperative and appears stated age Ears: normal TM's and external ear canals both ears Throat: lips, mucosa, and tongue normal; teeth and gums normal Neck: no adenopathy, no carotid bruit, supple, symmetrical, trachea midline and thyroid not enlarged, symmetric, no tenderness/mass/nodules Back: symmetric, no curvature. ROM normal. No CVA tenderness. Lungs: clear to auscultation bilaterally Heart: regular rate and rhythm, S1, S2 normal, no murmur, click, rub or gallop Abdomen: soft, non-tender; bowel sounds normal; no masses,  no organomegaly Pulses: 2+ and symmetric Skin: Skin color, texture, turgor normal. No rashes or lesions Lymph nodes: Cervical, supraclavicular, and axillary nodes normal.  Assessment and Plan:  Chronic cholecystitis with calculus Recurrent nausea attributed to GB disease but is likely multifactorial and may include gastroparesis.  Referral to Dr Bary Castilla now that her nutritional status has improved and diabetes is better controlled     Gingivitis, chronic Resume use of chlorhexidine rinse and urged to see a dentist to have the remainder of her teeth pulled.   Type II or unspecified type diabetes mellitus without mention of complication, not  stated as uncontrolled A1c is finally < 9.0 with addition of insulin and increased frequency of visits and education of caregivers.   Lab Results  Component Value Date   HGBA1C 8.2* 12/08/2013     Generalized muscle weakness Recurrent,  Secondary to chronic pain from OA and DDD leading to deconditioning. Today's symptoms are aggravated by recurrent N/VD  Chronic pain syndrome Continue vicodin for pain.  Narcotics contract signed and UDS pending.   COPD (chronic obstructive pulmonary disease) Secondary to tobacco abuse.  She has no history of recent PFTs and has no wheezing on exam currently.   Diarrhea Appears to be noninfectious given her history and may be multifactorial  Due to gastroparesis and chronic cholecystitis.  Stool cultures are pending.   Urinary frequency Given her recent diarrhea,  Will send urine for culture and treat if indicated.   A total of 40 minutes was spent with patient more than half of which was spent in counseling, reviewing records  from other prviders and coordination of care.  Updated Medication List Outpatient Encounter Prescriptions as of 01/13/2014  Medication Sig  . albuterol (PROVENTIL HFA;VENTOLIN HFA) 108 (90 BASE) MCG/ACT inhaler Inhale 2 puffs into the lungs every 6 (six) hours as needed for wheezing.  . Calcium Carbonate-Vitamin D (CALCARB 600/D) 600-400 MG-UNIT per tablet Take 2 tablets by mouth daily.  . carboxymethylcellulose (REFRESH) 1 % ophthalmic solution 1 drop as needed.  . chlorhexidine (PERIDEX) 0.12 % solution Use as directed 15 mLs in the mouth or throat 2 (two) times daily.  . Cholecalciferol (VITAMIN D PO) Take 1.25 mg by mouth once a week.  . cyanocobalamin (,VITAMIN B-12,) 1000 MCG/ML injection Inject 1 mL (1,000 mcg total) into the muscle every 30 (thirty) days.  . cyanocobalamin (,VITAMIN B-12,) 1000 MCG/ML injection INJECT 1 ML INTO THE MUSCLE EVERY 30 DAYS.  Marland Kitchen diazepam (VALIUM) 5 MG tablet TAKE ONE (1) TABLET THREE (3)  TIMES EACH DAY  . exemestane (AROMASIN) 25 MG tablet Take 25 mg by mouth daily after breakfast.  . HYDROcodone-acetaminophen (NORCO) 10-325 MG per tablet Take 1 tablet by mouth every 6 (six) hours as needed.  . insulin aspart protamine- aspart (NOVOLOG MIX 70/30) (70-30) 100 UNIT/ML injection 38 units with breakfast and 40 units with supper  . insulin detemir (LEVEMIR) 100 UNIT/ML injection 16 to 20 units daily  . Insulin Syringe-Needle U-100 31G X 5/16" 1 ML MISC TEST BLOOD SUGARS TWO TIMES DAILY.  Marland Kitchen losartan (COZAAR) 100 MG tablet Take 1 tablet (100 mg total) by mouth daily.  . metoCLOPramide (REGLAN) 10 MG tablet Take one tablet by mouth 30 minutes before meals  . metoprolol tartrate (LOPRESSOR) 25 MG tablet TAKE ONE TABLET TWICE DAILY  . omeprazole (PRILOSEC) 20 MG capsule TAKE ONE (1) CAPSULE EACH DAY  . ondansetron (ZOFRAN) 4 MG tablet Take 1 tablet (4 mg total) by mouth every 8 (eight) hours as needed.  Vladimir Faster Glycol-Propyl Glycol (SYSTANE OP) Apply to eye as needed.  . promethazine (PHENERGAN) 12.5 MG tablet Take 1 tablet (12.5 mg total) by mouth every 8 (eight) hours as needed for nausea or vomiting.  Marland Kitchen rOPINIRole (REQUIP) 0.5 MG tablet TAKE ONE TABLET ONE TO THREE HOURS BEFORE BEDTIME. INCREASE DOSE WEEKLY AS NEEDED  . simvastatin (ZOCOR) 40 MG tablet TAKE ONE (1) TABLET EACH DAY  . SYRINGE-NEEDLE, DISP, 3 ML (B-D SYRINGE/NEEDLE 3CC/25GX5/8) 25G X 5/8" 3 ML MISC Use once a day  . Syringe/Needle, Disp, (SYRINGE LUER LOCK) 25G X 5/8" 3 ML MISC 1 application by Does not apply route every 30 (thirty) days.  . TDaP (BOOSTRIX) 5-2.5-18.5 LF-MCG/0.5 injection Inject 0.5 mLs into the muscle once.  . Vitamin D, Ergocalciferol, (DRISDOL) 50000 UNITS CAPS capsule TAKE ONE CAPSULE EVERY WEEK  . [DISCONTINUED] chlorhexidine (PERIDEX) 0.12 % solution Use as directed 15 mLs in the mouth or throat 2 (two) times daily.     Orders Placed This Encounter  Procedures  . Urine culture  . Stool  culture  . Clostridium difficile EIA  . COMPLETE METABOLIC PANEL WITH GFR  . CBC with Differential  . Lipase  . Ambulatory referral to General Surgery  . POCT urinalysis dipstick    No Follow-up on file.

## 2014-01-14 ENCOUNTER — Encounter: Payer: Self-pay | Admitting: Internal Medicine

## 2014-01-14 DIAGNOSIS — G894 Chronic pain syndrome: Secondary | ICD-10-CM | POA: Insufficient documentation

## 2014-01-14 DIAGNOSIS — R197 Diarrhea, unspecified: Secondary | ICD-10-CM | POA: Insufficient documentation

## 2014-01-14 DIAGNOSIS — R350 Frequency of micturition: Secondary | ICD-10-CM | POA: Insufficient documentation

## 2014-01-14 LAB — COMPLETE METABOLIC PANEL WITH GFR
ALBUMIN: 3.2 g/dL — AB (ref 3.5–5.2)
ALK PHOS: 102 U/L (ref 39–117)
ALT: 25 U/L (ref 0–35)
AST: 42 U/L — ABNORMAL HIGH (ref 0–37)
BILIRUBIN TOTAL: 1 mg/dL (ref 0.2–1.2)
BUN: 15 mg/dL (ref 6–23)
CO2: 27 meq/L (ref 19–32)
Calcium: 9.2 mg/dL (ref 8.4–10.5)
Chloride: 108 mEq/L (ref 96–112)
Creat: 1.42 mg/dL — ABNORMAL HIGH (ref 0.50–1.10)
GFR, Est African American: 40 mL/min — ABNORMAL LOW
GFR, Est Non African American: 35 mL/min — ABNORMAL LOW
Glucose, Bld: 142 mg/dL — ABNORMAL HIGH (ref 70–99)
POTASSIUM: 4 meq/L (ref 3.5–5.3)
SODIUM: 142 meq/L (ref 135–145)
TOTAL PROTEIN: 6.3 g/dL (ref 6.0–8.3)

## 2014-01-14 LAB — URINE CULTURE: Colony Count: 50000

## 2014-01-14 NOTE — Assessment & Plan Note (Signed)
Resume use of chlorhexidine rinse and urged to see a dentist to have the remainder of her teeth pulled.

## 2014-01-14 NOTE — Assessment & Plan Note (Signed)
Continue vicodin for pain.  Narcotics contract signed and UDS pending.

## 2014-01-14 NOTE — Assessment & Plan Note (Signed)
Secondary to tobacco abuse.  She has no history of recent PFTs and has no wheezing on exam currently.

## 2014-01-14 NOTE — Assessment & Plan Note (Signed)
Given her recent diarrhea,  Will send urine for culture and treat if indicated.

## 2014-01-14 NOTE — Assessment & Plan Note (Signed)
Recurrent,  Secondary to chronic pain from OA and DDD leading to deconditioning. Today's symptoms are aggravated by recurrent N/VD

## 2014-01-14 NOTE — Assessment & Plan Note (Signed)
A1c is finally < 9.0 with addition of insulin and increased frequency of visits and education of caregivers.   Lab Results  Component Value Date   HGBA1C 8.2* 12/08/2013

## 2014-01-14 NOTE — Assessment & Plan Note (Signed)
Appears to be noninfectious given her history and may be multifactorial  Due to gastroparesis and chronic cholecystitis.  Stool cultures are pending.

## 2014-02-12 ENCOUNTER — Ambulatory Visit (INDEPENDENT_AMBULATORY_CARE_PROVIDER_SITE_OTHER): Payer: Medicare HMO | Admitting: Internal Medicine

## 2014-02-12 ENCOUNTER — Encounter: Payer: Self-pay | Admitting: Internal Medicine

## 2014-02-12 VITALS — BP 126/60 | HR 63 | Temp 97.9°F | Resp 16 | Wt 191.5 lb

## 2014-02-12 DIAGNOSIS — R32 Unspecified urinary incontinence: Secondary | ICD-10-CM

## 2014-02-12 DIAGNOSIS — K7460 Unspecified cirrhosis of liver: Secondary | ICD-10-CM

## 2014-02-12 DIAGNOSIS — R5381 Other malaise: Secondary | ICD-10-CM

## 2014-02-12 DIAGNOSIS — M6281 Muscle weakness (generalized): Secondary | ICD-10-CM

## 2014-02-12 DIAGNOSIS — R531 Weakness: Secondary | ICD-10-CM

## 2014-02-12 DIAGNOSIS — K8010 Calculus of gallbladder with chronic cholecystitis without obstruction: Secondary | ICD-10-CM

## 2014-02-12 DIAGNOSIS — G894 Chronic pain syndrome: Secondary | ICD-10-CM

## 2014-02-12 DIAGNOSIS — R5383 Other fatigue: Secondary | ICD-10-CM

## 2014-02-12 LAB — POCT URINALYSIS DIPSTICK
Bilirubin, UA: NEGATIVE
GLUCOSE UA: NEGATIVE
KETONES UA: NEGATIVE
Nitrite, UA: NEGATIVE
PROTEIN UA: NEGATIVE
RBC UA: NEGATIVE
SPEC GRAV UA: 1.025
Urobilinogen, UA: 1
pH, UA: 5.5

## 2014-02-12 MED ORDER — HYDROCODONE-ACETAMINOPHEN 10-325 MG PO TABS: 1.0000 | ORAL_TABLET | Freq: Four times a day (QID) | ORAL | Status: DC | PRN

## 2014-02-12 NOTE — Progress Notes (Signed)
Pre-visit discussion using our clinic review tool. No additional management support is needed unless otherwise documented below in the visit note.  

## 2014-02-12 NOTE — Patient Instructions (Signed)
Home Health PT has been ordered to help you incresed your muscle strength  Referral to Urology for urinary incontinence   Return in late June for diabetes follow up

## 2014-02-12 NOTE — Progress Notes (Signed)
Patient ID: Becky Roberts, female   DOB: 1932-08-16, 80 y.o.   MRN: [id number]   Patient Active Problem List   Diagnosis Date Noted  . Cirrhosis of liver not due to alcohol 02/14/2014  . Chronic pain syndrome 01/14/2014  . Diarrhea 01/14/2014  . Urinary frequency 01/14/2014  . Gingivitis, chronic 01/13/2014  . Noncompliance of patient with dietary regimen 09/07/2013  . History of breast cancer 08/17/2013  . Dental infection 05/02/2013  . Restless legs syndrome 01/21/2013  . Chronic diastolic congestive heart failure 12/03/2012  . Chronic cholecystitis with calculus 11/05/2012  . Obesity (BMI 30-39.9) 04/17/2012  . Type II or unspecified type diabetes mellitus without mention of complication, not stated as uncontrolled   . Depression   . Hypertension   . Arthritis   . COPD (chronic obstructive pulmonary disease)   . Breast cancer 03/31/2012  . Generalized muscle weakness 03/06/2012    Subjective:  CC:   Chief Complaint  Patient presents with  . Follow-up    from UTI    HPI:   Becky Roberts is a 80 y.o. female who presents for Follow up on chronic issues including generalized weakness., uncontrolled DM type 2 chronic nausea and chronic pain..  Having trouble rising from seated position,  Had a fall while pulling up her underwear,  Was slightly bent over.  Living in daughter's home .  Seems lethargic today.  Has been unable to walk for the last several weeks.bilateral leg weakness .Has been sleeping on a couch.  Her daytime activities were reviewed today  Her mornings  Are supervised by [place],her son's fiance,  Son [name] adminsters all of her  insulin injections.  First one at 7 am .  Home Health RN comes at 1`:00 pm.  Then son gives her the lunchtime insulin  Then again at evening.  He uses  a pill bottle to administer meds including the vicodin .  She is Overdue for urine drug screen, her last one was negative despite receiving monthly refills.   Troubled by urinary  incontinence.  Has  No control.  Wearing a diaper.  Incontinence has been progressive for the past year,  Since her breast cancer ws diagnosed.  Wants  to see a specialist .  DM improved with three insulin shots daily. Sugars have been Under 150 most of the time.  Mornings and mid are the best.    No diarrhea, no burning ,  No coughing currenlty.  Using inhaler     Past Medical History  Diagnosis Date  . Blood transfusion reaction   . Diabetes mellitus   . Depression   . Hypertension   . Anemia 2012    hgb 5.5, admitted,  workup multifactorial,  b12 deficiency  . Arthritis   . Asthma   . Anemia in chronic kidney disease(285.21)   . Breast cancer July 2,2013    T1c,N0,M0. ER + PR- , HER-2/neu not overexpressing..  . Portal hypertension February 2014    CT suggested a small nodular liver, multiple varices adjacent to the spleen and inferior mediastinum and paraesophageal region. No ascites.    Past Surgical History  Procedure Laterality Date  . Breast biopsy  July 2013    [name]: wide excision   . Colonoscopy  2012       The following portions of the patient's history were reviewed and updated as appropriate: Allergies, current medications, and problem list.    Review of Systems:   Patient denies headache, fevers, malaise, unintentional weight loss,  skin rash, eye pain, sinus congestion and sinus pain, sore throat, dysphagia,  hemoptysis , cough, dyspnea, wheezing, chest pain, palpitations, orthopnea, edema, abdominal pain, nausea, melena, diarrhea, constipation, flank pain, dysuria, hematuria, urinary  Frequency, nocturia, numbness, tingling, seizures,  Focal weakness, Loss of consciousness,  Tremor, insomnia, depression, anxiety, and suicidal ideation.     History   Social History  . Marital Status: Legally Separated    Spouse Name: N/A    Number of Children: N/A  . Years of Education: N/A   Occupational History  . Not on file.   Social History Main Topics  .  Smoking status: Former Research scientist (life sciences)  . Smokeless tobacco: Never Used  . Alcohol Use: No  . Drug Use: No  . Sexual Activity: Not on file   Other Topics Concern  . Not on file   Social History Narrative   Son Wille Glaser     Objective:  Filed Vitals:   02/12/14 1340  BP: 126/60  Pulse: 63  Temp: 97.9 F (36.6 C)  Resp: 16     General appearance: alert, cooperative and appears stated age Ears: normal TM's and external ear canals both ears Throat: lips, mucosa, and tongue normal; teeth and gums normal Neck: no adenopathy, no carotid bruit, supple, symmetrical, trachea midline and thyroid not enlarged, symmetric, no tenderness/mass/nodules Back: symmetric, no curvature. ROM normal. No CVA tenderness. Lungs: clear to auscultation bilaterally Heart: regular rate and rhythm, S1, S2 normal, no murmur, click, rub or gallop Abdomen: soft, non-tender; bowel sounds normal; no masses,  no organomegaly Pulses: 2+ and symmetric Skin: Skin color, texture, turgor normal. No rashes or lesions Lymph nodes: Cervical, supraclavicular, and axillary nodes normal.  Assessment and Plan:  Chronic pain syndrome Continue vicodin for chronic abdominal pain secondary to presumed chronic cholecystitis. Narcotics contract signed and UDS pending.     Generalized muscle weakness She is unable to rise from seated position wihtout assistance or to walk > 200 ft without increased risk of falling .  Home PT ordred.   Chronic cholecystitis with calculus appt with Dr Bary Castilla arranged to discuss gall bladder sugery  Cirrhosis of liver not due to alcohol She will need assessment of synthetic function if surgery is planned.   A total of 40 minutes was spent with patient more than half of which was spent in counseling, reviewing records from other prviders and coordination of care.  Updated Medication List Outpatient Encounter Prescriptions as of 02/12/2014  Medication Sig  . albuterol (PROVENTIL HFA;VENTOLIN HFA) 108  (90 BASE) MCG/ACT inhaler Inhale 2 puffs into the lungs every 6 (six) hours as needed for wheezing.  . Calcium Carbonate-Vitamin D (CALCARB 600/D) 600-400 MG-UNIT per tablet Take 2 tablets by mouth daily.  . carboxymethylcellulose (REFRESH) 1 % ophthalmic solution 1 drop as needed.  . chlorhexidine (PERIDEX) 0.12 % solution Use as directed 15 mLs in the mouth or throat 2 (two) times daily.  . Cholecalciferol (VITAMIN D PO) Take 1.25 mg by mouth once a week.  . cyanocobalamin (,VITAMIN B-12,) 1000 MCG/ML injection Inject 1 mL (1,000 mcg total) into the muscle every 30 (thirty) days.  . cyanocobalamin (,VITAMIN B-12,) 1000 MCG/ML injection INJECT 1 ML INTO THE MUSCLE EVERY 30 DAYS.  Marland Kitchen diazepam (VALIUM) 5 MG tablet TAKE ONE (1) TABLET THREE (3) TIMES EACH DAY  . exemestane (AROMASIN) 25 MG tablet Take 25 mg by mouth daily after breakfast.  . HYDROcodone-acetaminophen (NORCO) 10-325 MG per tablet Take 1 tablet by mouth every 6 (six) hours as  needed.  . insulin aspart protamine- aspart (NOVOLOG MIX 70/30) (70-30) 100 UNIT/ML injection 38 units with breakfast and 40 units with supper  . insulin detemir (LEVEMIR) 100 UNIT/ML injection 16 to 20 units daily  . Insulin Syringe-Needle U-100 31G X 5/16" 1 ML MISC TEST BLOOD SUGARS TWO TIMES DAILY.  Marland Kitchen losartan (COZAAR) 100 MG tablet Take 1 tablet (100 mg total) by mouth daily.  . metoCLOPramide (REGLAN) 10 MG tablet Take one tablet by mouth 30 minutes before meals  . metoprolol tartrate (LOPRESSOR) 25 MG tablet TAKE ONE TABLET TWICE DAILY  . omeprazole (PRILOSEC) 20 MG capsule TAKE ONE (1) CAPSULE EACH DAY  . ondansetron (ZOFRAN) 4 MG tablet Take 1 tablet (4 mg total) by mouth every 8 (eight) hours as needed.  Vladimir Faster Glycol-Propyl Glycol (SYSTANE OP) Apply to eye as needed.  . promethazine (PHENERGAN) 12.5 MG tablet Take 1 tablet (12.5 mg total) by mouth every 8 (eight) hours as needed for nausea or vomiting.  Marland Kitchen rOPINIRole (REQUIP) 0.5 MG tablet TAKE ONE  TABLET ONE TO THREE HOURS BEFORE BEDTIME. INCREASE DOSE WEEKLY AS NEEDED  . simvastatin (ZOCOR) 40 MG tablet TAKE ONE (1) TABLET EACH DAY  . SYRINGE-NEEDLE, DISP, 3 ML (B-D SYRINGE/NEEDLE 3CC/25GX5/8) 25G X 5/8" 3 ML MISC Use once a day  . Syringe/Needle, Disp, (SYRINGE LUER LOCK) 25G X 5/8" 3 ML MISC 1 application by Does not apply route every 30 (thirty) days.  . TDaP (BOOSTRIX) 5-2.5-18.5 LF-MCG/0.5 injection Inject 0.5 mLs into the muscle once.  . Vitamin D, Ergocalciferol, (DRISDOL) 50000 UNITS CAPS capsule TAKE ONE CAPSULE EVERY WEEK  . [DISCONTINUED] HYDROcodone-acetaminophen (NORCO) 10-325 MG per tablet Take 1 tablet by mouth every 6 (six) hours as needed.     Orders Placed This Encounter  Procedures  . Urine culture  . Drug Screen, Urine  . Ambulatory referral to Urology  . Home Health  . Face-to-face encounter (required for Medicare/Medicaid patients)  . POCT Urinalysis Dipstick    No Follow-up on file.

## 2014-02-14 DIAGNOSIS — K7460 Unspecified cirrhosis of liver: Secondary | ICD-10-CM | POA: Insufficient documentation

## 2014-02-14 LAB — URINE CULTURE

## 2014-02-14 NOTE — Assessment & Plan Note (Signed)
appt with Dr Bary Castilla arranged to discuss gall bladder sugery

## 2014-02-14 NOTE — Assessment & Plan Note (Addendum)
Continue vicodin for chronic abdominal pain secondary to presumed chronic cholecystitis. Narcotics contract signed and UDS pending.

## 2014-02-14 NOTE — Assessment & Plan Note (Signed)
She is unable to rise from seated position wihtout assistance or to walk > 200 ft without increased risk of falling .  Home PT ordred.

## 2014-02-14 NOTE — Assessment & Plan Note (Signed)
She will need assessment of synthetic function if surgery is planned.

## 2014-02-15 ENCOUNTER — Telehealth: Payer: Self-pay | Admitting: *Deleted

## 2014-02-15 NOTE — Telephone Encounter (Signed)
Patient called asking for Hydrocodone script, advised patient as soon as we receive labs back will notify patient.FYI

## 2014-02-15 NOTE — Telephone Encounter (Signed)
Called to check status of refill.  Advised they will receive a call when ready for pickup.

## 2014-02-16 ENCOUNTER — Other Ambulatory Visit: Payer: Self-pay | Admitting: *Deleted

## 2014-02-16 ENCOUNTER — Encounter: Payer: Self-pay | Admitting: *Deleted

## 2014-02-16 ENCOUNTER — Encounter: Payer: Self-pay | Admitting: Internal Medicine

## 2014-02-17 ENCOUNTER — Telehealth: Payer: Self-pay | Admitting: Internal Medicine

## 2014-02-17 MED ORDER — DIAZEPAM 5 MG PO TABS: ORAL_TABLET | ORAL | Status: DC

## 2014-02-17 NOTE — Telephone Encounter (Signed)
Patient notified by Lorriane Shire per Dr. Derrel Nip that no more Hydrocodone scripts will be filled. Due to UDS being negative, patient ask for diazepam to filled , printed script as Dr. Derrel Nip advised.

## 2014-02-17 NOTE — Telephone Encounter (Signed)
Patient called and stated she stops taking her Hydrocodone a few days before MD appointments due to they make her very sleepy, that she only takes them at night to ease the pain for sleep. Advised patient if she only takes one a night then she should have medication left last dispense was for 120. Patient stated I have got to find a way to get my pain medication. FYI

## 2014-02-17 NOTE — Telephone Encounter (Signed)
Left message for patient to return call to office. 

## 2014-02-17 NOTE — Telephone Encounter (Signed)
Her urine drug screen was negative for vicodin  On April 15, and she had a refill for #120 vicodin on March 23rd,  It should not have been negative if she was using the medication as prescribed (4 daily) She is in violation of our narcotic contract ,  I will no longer refill her vicodin

## 2014-02-18 ENCOUNTER — Other Ambulatory Visit: Payer: Self-pay | Admitting: Internal Medicine

## 2014-02-18 ENCOUNTER — Encounter: Payer: Self-pay | Admitting: Internal Medicine

## 2014-02-18 ENCOUNTER — Ambulatory Visit (INDEPENDENT_AMBULATORY_CARE_PROVIDER_SITE_OTHER): Payer: Medicare PPO | Admitting: General Surgery

## 2014-02-18 ENCOUNTER — Encounter: Payer: Self-pay | Admitting: General Surgery

## 2014-02-18 VITALS — BP 112/64 | HR 72 | Resp 16 | Ht 62.5 in | Wt 190.0 lb

## 2014-02-18 DIAGNOSIS — K8020 Calculus of gallbladder without cholecystitis without obstruction: Secondary | ICD-10-CM

## 2014-02-18 DIAGNOSIS — Z853 Personal history of malignant neoplasm of breast: Secondary | ICD-10-CM

## 2014-02-18 NOTE — Progress Notes (Signed)
Patient ID: Becky Roberts, female   DOB: [birth year], 80 y.o.   MRN: [id number]  Chief Complaint  Patient presents with  . Follow-up    breast cancer    HPI Becky Roberts is a 80 y.o. female.  who presents for her follow up breast cancer evaluation. Patient does perform regular self breast checks and has not noticed any new breast issues. Last mammogram was October 2014.  She has been having mobility issues and has fallen several times recently.  Bowels and urinary control changes recently. She reports that her mobility is markedly impaired, and she is unable to get up to the restroom. She has had episodes of incontinence of both bowel and bladder function according to her son's girlfriend who is her daily caregiver. There has been no observed dietary intolerance. No triggers for her loose stools. No abdominal pain and response to meals.   HPI  Past Medical History  Diagnosis Date  . Blood transfusion reaction   . Diabetes mellitus   . Depression   . Hypertension   . Anemia 2012    hgb 5.5, admitted,  workup multifactorial,  b12 deficiency  . Arthritis   . Asthma   . Anemia in chronic kidney disease(285.21)   . Portal hypertension February 2014    CT suggested a small nodular liver, multiple varices adjacent to the spleen and inferior mediastinum and paraesophageal region. No ascites.  . Breast cancer July 2,2013    T1c,N0,M0. ER + PR- , HER-2/neu not overexpressing..    Past Surgical History  Procedure Laterality Date  . Breast biopsy  July 2013    Tymothy Cass: wide excision   . Colonoscopy  2012    Family History  Problem Relation Age of Onset  . Arthritis Mother   . Hypertension Mother   . Diabetes Mother   . Arthritis Father   . Hypertension Father   . Diabetes Father   . Arthritis Other   . Cancer Other     breast/lung    Social History History  Substance Use Topics  . Smoking status: Former Research scientist (life sciences)  . Smokeless tobacco: Never Used  . Alcohol Use: No     Allergies  Allergen Reactions  . Aspirin Other (See Comments)    Palpitations,sweating  . Sulfa Drugs Cross Reactors     Current Outpatient Prescriptions  Medication Sig Dispense Refill  . albuterol (PROVENTIL HFA;VENTOLIN HFA) 108 (90 BASE) MCG/ACT inhaler Inhale 2 puffs into the lungs every 6 (six) hours as needed for wheezing.  1 Inhaler  11  . Calcium Carbonate-Vitamin D (CALCARB 600/D) 600-400 MG-UNIT per tablet Take 2 tablets by mouth daily.      . carboxymethylcellulose (REFRESH) 1 % ophthalmic solution 1 drop as needed.      . chlorhexidine (PERIDEX) 0.12 % solution Use as directed 15 mLs in the mouth or throat 2 (two) times daily.  120 mL  2  . Cholecalciferol (VITAMIN D PO) Take 1.25 mg by mouth once a week.      . cyanocobalamin (,VITAMIN B-12,) 1000 MCG/ML injection Inject 1 mL (1,000 mcg total) into the muscle every 30 (thirty) days.  10 mL  12  . cyanocobalamin (,VITAMIN B-12,) 1000 MCG/ML injection INJECT 1 ML INTO THE MUSCLE EVERY 30 DAYS.  1 mL  5  . diazepam (VALIUM) 5 MG tablet TAKE ONE (1) TABLET THREE (3) TIMES EACH DAY  90 tablet  0  . exemestane (AROMASIN) 25 MG tablet Take 25 mg by mouth daily after breakfast.      .  HYDROcodone-acetaminophen (NORCO) 10-325 MG per tablet Take 1 tablet by mouth every 6 (six) hours as needed.  120 tablet  0  . insulin aspart protamine- aspart (NOVOLOG MIX 70/30) (70-30) 100 UNIT/ML injection 38 units with breakfast and 40 units with supper  10 mL  3  . insulin detemir (LEVEMIR) 100 UNIT/ML injection 16 to 20 units daily  15 mL  12  . Insulin Syringe-Needle U-100 31G X 5/16" 1 ML MISC TEST BLOOD SUGARS TWO TIMES DAILY.  100 each  5  . losartan (COZAAR) 100 MG tablet Take 1 tablet (100 mg total) by mouth daily.  90 tablet  3  . metoCLOPramide (REGLAN) 10 MG tablet Take one tablet by mouth 30 minutes before meals      . metoprolol tartrate (LOPRESSOR) 25 MG tablet TAKE ONE TABLET TWICE DAILY  180 tablet  1  . omeprazole (PRILOSEC) 20  MG capsule TAKE ONE (1) CAPSULE EACH DAY  30 capsule  5  . ondansetron (ZOFRAN) 4 MG tablet Take 1 tablet (4 mg total) by mouth every 8 (eight) hours as needed.  90 tablet  3  . Polyethyl Glycol-Propyl Glycol (SYSTANE OP) Apply to eye as needed.      . promethazine (PHENERGAN) 12.5 MG tablet Take 1 tablet (12.5 mg total) by mouth every 8 (eight) hours as needed for nausea or vomiting.  20 tablet  0  . rOPINIRole (REQUIP) 0.5 MG tablet TAKE ONE TABLET ONE TO THREE HOURS BEFORE BEDTIME. INCREASE DOSE WEEKLY AS NEEDED  60 tablet  4  . simvastatin (ZOCOR) 40 MG tablet TAKE ONE (1) TABLET EACH DAY  30 tablet  5  . SYRINGE-NEEDLE, DISP, 3 ML (B-D SYRINGE/NEEDLE 3CC/25GX5/8) 25G X 5/8" 3 ML MISC Use once a day  50 each  5  . Syringe/Needle, Disp, (SYRINGE LUER LOCK) 25G X 5/8" 3 ML MISC 1 application by Does not apply route every 30 (thirty) days.  50 each  0  . TDaP (BOOSTRIX) 5-2.5-18.5 LF-MCG/0.5 injection Inject 0.5 mLs into the muscle once.  0.5 mL  0  . Vitamin D, Ergocalciferol, (DRISDOL) 50000 UNITS CAPS capsule TAKE ONE CAPSULE EVERY WEEK  4 capsule  2   No current facility-administered medications for this visit.    Review of Systems Review of Systems  Constitutional: Negative.   Respiratory: Positive for shortness of breath.   Cardiovascular: Negative.   Gastrointestinal: Positive for diarrhea.    Blood pressure 112/64, pulse 72, resp. rate 16, height 5' 2.5" (1.588 m), weight 190 lb (86.183 kg).  Physical Exam Physical Exam  Constitutional: She is oriented to person, place, and time. She appears well-developed and well-nourished.  Eyes: Conjunctivae are normal.  Neck: Neck supple.  Cardiovascular: Normal rate, regular rhythm and normal heart sounds.   Pulmonary/Chest: Effort normal and breath sounds normal. Right breast exhibits no inverted nipple, no mass, no nipple discharge, no skin change and no tenderness. Left breast exhibits no inverted nipple, no mass, no nipple discharge,  no skin change and no tenderness.  Abdominal: Soft. Normal appearance. There is no tenderness.  Lymphadenopathy:    She has no cervical adenopathy.    She has no axillary adenopathy.  Neurological: She is alert and oriented to person, place, and time.  Skin: Skin is warm and dry.    Data Reviewed CT scan of 11/19/2012 showed multiple gallstones. No evidence of cholecystitis. Multiple varices were noted adjacent to the spleen, inferior mediastinal paraesophageal region and along the perigastric region of the lesser  curvature. No ascites was appreciated. Nodular changes consistent with cirrhosis were also appreciated.  Assessment    T1c breast cancer.  Cholelithiasis, asymptomatic.  CT evidence of portal hypertension.    Plan    This patient is a poor risk for operative intervention both based on her multiple medical comorbidities, poor mobility and the evidence of varices on CT imaging. Without clear evidence of cholecystitis, I think the risks of surgery far outweighs any potential benefit.  Her reports of incontinence is new, and does raise a question of progressive lower back neurologic compression.  At this time we'll plan for a followup examination fall 2015 with bilateral mammograms. In light of her cirrhosis findings on CT, will withhold antiestrogen therapy at this time.     PCP: Tad Moore Ringo Sherod 02/19/2014, 9:00 AM

## 2014-02-18 NOTE — Patient Instructions (Signed)
Continue self breast exams. Call office for any new breast issues or concerns. 

## 2014-02-19 ENCOUNTER — Encounter: Payer: Self-pay | Admitting: General Surgery

## 2014-02-19 ENCOUNTER — Encounter: Payer: Self-pay | Admitting: Internal Medicine

## 2014-02-19 ENCOUNTER — Other Ambulatory Visit: Payer: Self-pay | Admitting: *Deleted

## 2014-02-19 MED ORDER — DIAZEPAM 5 MG PO TABS: ORAL_TABLET | ORAL | Status: DC

## 2014-02-19 NOTE — Telephone Encounter (Signed)
Rx faxed to pharmacy  

## 2014-02-19 NOTE — Telephone Encounter (Signed)
Ok to refill,  printed rx  

## 2014-02-23 ENCOUNTER — Encounter: Payer: Self-pay | Admitting: Internal Medicine

## 2014-03-08 ENCOUNTER — Other Ambulatory Visit: Payer: Self-pay | Admitting: Internal Medicine

## 2014-03-08 NOTE — Telephone Encounter (Signed)
Refill

## 2014-03-11 ENCOUNTER — Telehealth: Payer: Self-pay | Admitting: Internal Medicine

## 2014-03-11 NOTE — Telephone Encounter (Signed)
Patient Information:  Caller Name: Wille Glaser  Phone: [phone]  Patient: [place], [place]  Gender: Female  DOB: Feb 01, 1932  Age: 80 Years  PCP: Deborra Medina (Adults only)  Office Follow Up:  Does the office need to follow up with this patient?: No  Instructions For The Office: N/A   Symptoms  Reason For Call & Symptoms: Son calling, she is dragging her left foot behind her x 72 hours.  She normally doesn't get up withour assistance and he has noticed when helping her up to use the bed side commode that her left leg isn't working right.  No numbness or tingling in the leg.  She does have pain.  No swelling or bruising noted.  Reviewed Health History In EMR: Yes  Reviewed Medications In EMR: Yes  Reviewed Allergies In EMR: Yes  Reviewed Surgeries / Procedures: Yes  Date of Onset of Symptoms: 03/08/2014  Guideline(s) Used:  Leg Pain  Disposition Per Guideline:   Go to ED Now  Reason For Disposition Reached:   Unable to walk  Advice Given:  N/A  RN Overrode Recommendation:  Go To ED  office closing

## 2014-03-11 NOTE — Telephone Encounter (Signed)
FYI

## 2014-03-17 ENCOUNTER — Encounter: Payer: Self-pay | Admitting: *Deleted

## 2014-03-22 ENCOUNTER — Other Ambulatory Visit: Payer: Self-pay | Admitting: Internal Medicine

## 2014-03-30 ENCOUNTER — Telehealth: Payer: Self-pay | Admitting: Internal Medicine

## 2014-03-30 NOTE — Telephone Encounter (Signed)
The patient states that she can't hardly walk and she is needing to see Dr. Derrel Nip soon.

## 2014-03-30 NOTE — Telephone Encounter (Signed)
Patient stated she is unable to move legs, cannot walk even with walker, scheduled patient 04/12/14

## 2014-04-12 ENCOUNTER — Ambulatory Visit (INDEPENDENT_AMBULATORY_CARE_PROVIDER_SITE_OTHER): Payer: Medicare HMO | Admitting: Internal Medicine

## 2014-04-12 DIAGNOSIS — E119 Type 2 diabetes mellitus without complications: Secondary | ICD-10-CM

## 2014-04-13 NOTE — Progress Notes (Signed)
Patient ID: Becky Roberts, female   DOB: [birth year], 80 y.o.   MRN: [id number] Patient no showed for the second time.

## 2014-04-13 NOTE — Assessment & Plan Note (Signed)
Patient no showed

## 2014-04-14 ENCOUNTER — Telehealth: Payer: Self-pay | Admitting: Cardiovascular Disease

## 2014-04-14 ENCOUNTER — Other Ambulatory Visit: Payer: Self-pay | Admitting: Internal Medicine

## 2014-04-14 MED ORDER — METOPROLOL TARTRATE 25 MG PO TABS: ORAL_TABLET | ORAL | Status: AC

## 2014-04-14 NOTE — Telephone Encounter (Signed)
[facility] calling for patient  refill for metoprolol

## 2014-04-14 NOTE — Telephone Encounter (Signed)
Refill sent for metoprolol to [facility].

## 2014-04-14 NOTE — Telephone Encounter (Signed)
Last refill 5.15.15, last OV 5.22.15.  Please advise refill.

## 2014-04-16 NOTE — Telephone Encounter (Signed)
Dose is being reduced to two times daily because of her excessive sedation the last gime she was here.  It  will not be refilled again without an appt.  She no showed earlier this week

## 2014-04-21 ENCOUNTER — Emergency Department: Payer: Self-pay | Admitting: Emergency Medicine

## 2014-04-21 LAB — CBC WITH DIFFERENTIAL/PLATELET
BASOS PCT: 1.2 %
Basophil #: 0.1 10*3/uL (ref 0.0–0.1)
EOS PCT: 3.2 %
Eosinophil #: 0.2 10*3/uL (ref 0.0–0.7)
HCT: 38.1 % (ref 35.0–47.0)
HGB: 13 g/dL (ref 12.0–16.0)
Lymphocyte #: 1.7 10*3/uL (ref 1.0–3.6)
Lymphocyte %: 30.2 %
MCH: 33.3 pg (ref 26.0–34.0)
MCHC: 34 g/dL (ref 32.0–36.0)
MCV: 98 fL (ref 80–100)
MONO ABS: 0.5 x10 3/mm (ref 0.2–0.9)
Monocyte %: 9.5 %
NEUTROS ABS: 3.2 10*3/uL (ref 1.4–6.5)
Neutrophil %: 55.9 %
PLATELETS: 99 10*3/uL — AB (ref 150–440)
RBC: 3.9 10*6/uL (ref 3.80–5.20)
RDW: 13.2 % (ref 11.5–14.5)
WBC: 5.7 10*3/uL (ref 3.6–11.0)

## 2014-04-21 LAB — COMPREHENSIVE METABOLIC PANEL
ALBUMIN: 2.5 g/dL — AB (ref 3.4–5.0)
ALT: 26 U/L
ANION GAP: 7 (ref 7–16)
AST: 31 U/L (ref 15–37)
Alkaline Phosphatase: 122 U/L — ABNORMAL HIGH
BUN: 12 mg/dL (ref 7–18)
Bilirubin,Total: 0.6 mg/dL (ref 0.2–1.0)
CO2: 27 mmol/L (ref 21–32)
Calcium, Total: 8.8 mg/dL (ref 8.5–10.1)
Chloride: 111 mmol/L — ABNORMAL HIGH (ref 98–107)
Creatinine: 1.52 mg/dL — ABNORMAL HIGH (ref 0.60–1.30)
EGFR (Non-African Amer.): 32 — ABNORMAL LOW
GFR CALC AF AMER: 37 — AB
GLUCOSE: 215 mg/dL — AB (ref 65–99)
OSMOLALITY: 295 (ref 275–301)
Potassium: 3.5 mmol/L (ref 3.5–5.1)
Sodium: 145 mmol/L (ref 136–145)
TOTAL PROTEIN: 6.3 g/dL — AB (ref 6.4–8.2)

## 2014-04-21 LAB — PROTIME-INR
INR: 1.2
PROTHROMBIN TIME: 14.9 s — AB (ref 11.5–14.7)

## 2014-04-21 LAB — URINALYSIS, COMPLETE
BILIRUBIN, UR: NEGATIVE
Glucose,UR: 100 mg/dL (ref 0–75)
KETONE: NEGATIVE
NITRITE: NEGATIVE
PH: 6 (ref 4.5–8.0)
PROTEIN: NEGATIVE
Specific Gravity: 1.01 (ref 1.003–1.030)

## 2014-04-23 LAB — URINE CULTURE

## 2014-05-06 ENCOUNTER — Ambulatory Visit: Payer: Medicare HMO | Admitting: Internal Medicine

## 2014-05-06 DIAGNOSIS — Z0289 Encounter for other administrative examinations: Secondary | ICD-10-CM

## 2014-05-11 ENCOUNTER — Encounter: Payer: Self-pay | Admitting: Internal Medicine

## 2014-05-11 ENCOUNTER — Other Ambulatory Visit: Payer: Self-pay | Admitting: Internal Medicine

## 2014-05-14 ENCOUNTER — Telehealth: Payer: Self-pay | Admitting: Internal Medicine

## 2014-05-14 NOTE — Telephone Encounter (Addendum)
°  Patient dismissed from [facility] by Deborra Medina MD , effective May 11, 2014. Dismissal letter sent out by certified / registered mail. [facility]  Certified dismissal letter returned as undeliverable, unclaimed, return to sender after three attempts by [place]. Letter placed in another envelope and resent as 1st class mail which does not require a signature. DAJ

## 2014-05-22 ENCOUNTER — Emergency Department: Payer: Self-pay | Admitting: Emergency Medicine

## 2014-05-27 ENCOUNTER — Ambulatory Visit: Payer: Medicare HMO | Admitting: Internal Medicine

## 2014-05-31 ENCOUNTER — Other Ambulatory Visit: Payer: Self-pay | Admitting: Internal Medicine

## 2014-06-01 ENCOUNTER — Telehealth: Payer: Self-pay | Admitting: Internal Medicine

## 2014-06-01 NOTE — Telephone Encounter (Signed)
Please advise dismissal letter went out 05/11/14.

## 2014-06-01 NOTE — Telephone Encounter (Signed)
Was it a no show yesterday?

## 2014-06-01 NOTE — Telephone Encounter (Signed)
Patient called stated she missed her appointment yesterday and wanted to be seen today

## 2014-06-02 NOTE — Telephone Encounter (Signed)
Patient calling for refill on valium and Hydrocodone and wanting an appointment for shingles, patient is living with daughter now and not at home. Patient was advised of letter of discharge was received and that she was to find a MD . Patient was also advised she should go to the ED for symptoms become worse.

## 2014-06-14 ENCOUNTER — Ambulatory Visit: Payer: Medicare HMO | Admitting: Cardiovascular Disease

## 2014-06-14 ENCOUNTER — Encounter: Payer: Self-pay | Admitting: *Deleted

## 2014-08-02 ENCOUNTER — Encounter: Payer: Self-pay | Admitting: General Surgery

## 2014-09-03 ENCOUNTER — Encounter: Payer: Self-pay | Admitting: *Deleted

## 2014-09-03 ENCOUNTER — Ambulatory Visit: Payer: Medicare HMO | Admitting: Cardiovascular Disease

## 2014-12-08 ENCOUNTER — Ambulatory Visit: Payer: Self-pay | Admitting: General Surgery

## 2014-12-09 ENCOUNTER — Encounter: Payer: Self-pay | Admitting: General Surgery

## 2015-01-18 NOTE — Discharge Summary (Signed)
PATIENT NAME:  Becky Roberts, Becky Roberts MR#:  [id number] DATE OF BIRTH:  27-Oct-1931  DATE OF ADMISSION:  06/16/2012 DATE OF DISCHARGE:  06/18/2012  PRIMARY CARE PHYSICIAN: Dr. Derrel Nip.   DISCHARGE DIAGNOSES:  1. Left lower lobe aspiration pneumonia.  2. Gallstones with nausea and vomiting.  3. Hypertension.  4. Diabetes.  5. Acute renal failure.  6. Chronic thrombocytopenia.   CONSULTANT: Dr. Bary Castilla, surgery.   IMAGING STUDIES: Chest x-ray which showed heterogeneous opacity in the left lower lung with resolution on repeat. Ultrasound of the abdomen and pelvis showed cholelithiasis with findings concerning for acute cholecystitis. Ultrasound of the abdomen also showed a heterogeneous opacity in the liver with CT or a MRI recommended.   ADMITTING HISTORY AND PHYSICAL: Please see detailed history and physical dictated on 06/16/2012. In brief, 81 year old Caucasian female patient with history of breast cancer, hypertension, diabetes who presented to the Emergency Room complaining of nausea, vomiting, and then fever of 104. The patient was found to have left lower lobe pneumonia on her chest x-ray and was admitted with acute respiratory failure, left lower lobe pneumonia with possible aspiration.   HOSPITAL COURSE:  1. Aspiration pneumonia. The patient was started on levofloxacin and clindamycin with which she improved well, was off the oxygen on the day of discharge. Afebrile. White count in the normal range and was transitioned to Levaquin and clindamycin oral at the time of discharge. The patient was seen and examined on the day of discharge.  2. Gallstones with nausea and vomiting. The patient did have problems with gallstones in the past. She had nausea and vomiting prior to admission. Ultrasound of the abdomen showed possible acute cholecystitis and Dr. Bary Castilla of surgery was consulted who suggested this was likely secondary to low albumin and did not suspect any acute cholecystitis considering the  clinical exam and normal liver function tests. Bilirubin was elevated at 1.9, but it is trending down. If the patient does have problems again with nausea and vomiting as outpatient, she will follow up with Dr. Bary Castilla and might need cholecystectomy.  3. Hyperechoic focus in the right lower hepatic lobe. This was seen on ultrasound of the abdomen and needs to be followed up with a CT scan or an MRI as outpatient by the primary care physician.   On the day of discharge, the patient has no nausea, vomiting, shortness of breath and has worked with physical therapy. Case was discussed with the family. Her discharge vitals show temperature 98.9, blood pressure 117/71, saturating 92% on room air, and was discharged home in fair condition.   DISCHARGE MEDICATIONS:  1. Glipizide 10 mg oral 2 times a day.  2. Diazepam 5 mg oral 2 times a day.  3. Omeprazole 20 mg oral once a day.  4. Acetaminophen/codeine 300/30, one tablet oral 3 times a day.  5. Lisinopril 10 mg oral once a day.  6. Simvastatin 40 mg oral once a day.  7. Cyanocobalamin 1000 micrograms once a day.  8. Levemir 14 units subcutaneous once a day.  9. Cleocin 300 mg oral 3 times a day for six days.  10. Levaquin 250 mg orally once a day for six days.  11. Robitussin-DM 5 mL every four hours as needed for cough.  12. Zofran 4 mg ODT 4 times a day as needed.   DISCHARGE INSTRUCTIONS:  1. The patient will follow up with her primary care physician within a week.  2. She will be on diabetic diet with activity as tolerated with assistance.  3. The patient is to follow up with Dr. Bary Castilla of surgery if she has any further nausea, vomiting or abdominal pain for possible cholecystectomy.  4. She is to return to the Emergency Room if she has any worsening shortness of breath, fever, or chest pain. This plan was discussed with the patient and her son at bedside, who verbalized understanding and are okay with the plan.      TIME SPENT ON THE DAY  OF DISCHARGE IN COORDINATION OF CARE: 35 minutes.    ____________________________ Leia Alf Deandra Goering, MD srs:ap D: 06/19/2012 13:51:48 ET T: 06/20/2012 12:01:05 ET JOB#: [id number]  cc: Alveta Heimlich R. Lyall Faciane, MD, <Dictator> Deborra Medina, MD Robert Bellow, MD Neita Carp MD ELECTRONICALLY SIGNED 06/24/2012 12:14

## 2015-01-18 NOTE — H&P (Signed)
PATIENT NAME:  BETH, GOODLIN MR#:  [id number] DATE OF BIRTH:  1931-12-10  DATE OF ADMISSION:  06/16/2012  PRIMARY CARE PHYSICIAN: Dr. Derrel Nip   History obtained from patient and her daughter at bedside. Old records have been reviewed. Case discussed with Dr. Ulice Brilliant of the Emergency Room. Chest x-ray and tele personally reviewed.   CHIEF COMPLAINT: Cough, weakness, and vomiting.   HISTORY OF PRESENTING ILLNESS: This is a 81 year old Caucasian female patient with history of breast cancer status post radiation treatments with the last treatment in August 2013 along with diabetes and hypertension who presents to the Emergency Room complaining of cough and generalized weakness along with fever of 104 at home. The patient had nausea and vomiting three days back which has resolved at this time but started having fever, cough, and mild shortness of breath. The patient's chest x-ray shows left lower lobe pneumonia needing oxygen 2 liters support, tachycardic, temperature 99.9 with elevated white count of 12.8 and being admitted for further management.   PAST MEDICAL HISTORY:  1. Hypertension. 2. Diabetes. 3. Hyperlipidemia. 4. Breast cancer. 5. Anxiety. 6. Arthritis.   ALLERGIES: Sulfa, aspirin, Vioxx.   PAST SURGICAL HISTORY:  1. Cataract surgeries.  2. Lumpectomy.  3. Right knee surgery. 4. Left knee surgery.   FAMILY HISTORY: Significant for mother and sister with breast cancer. Another sister had bone cancer. History of diabetes in the family.   SOCIAL HISTORY: No alcohol. No smoking. No drugs. Lives at home with her husband.   REVIEW OF SYSTEMS: CONSTITUTIONAL: Complains of fever, fatigue, weakness. EYES: No blurred vision, pain, redness. ENT: No tinnitus, ear pain, hearing loss. RESPIRATORY: Complains of cough, shortness of breath. No wheeze. Has clear sputum. CARDIOVASCULAR: No chest pain, orthopnea, edema. GI: Has nausea and vomiting which has resolved. No diarrhea or abdominal  pain. GENITOURINARY: No dysuria, hematuria, frequency. ENDOCRINE: No polyuria, nocturia, thyroid problems. HEMATOLOGIC/LYMPHATIC: No anemia, easy bruising, bleeding. INTEGUMENTARY: No acne, rash, lesions. MUSCULOSKELETAL: Has generalized muscle pains. No arthritis. NEUROLOGIC: No numbness, weakness, dysarthria. PSYCHIATRIC: No anxiety, depression.   HOME MEDICATIONS:  1. Acetaminophen/codeine 300/30 mg 1 tablet orally 3 times a day as needed.  2. Cyanocobalamin 1000 mcg oral daily.  3. Diazepam 5 mg oral 2 times a day.  4. Lasix 20 mg oral every other day as needed.  5. Glipizide 10 mg oral 2 times a day.  6. Levemir 14 units subcutaneous once a day.  7. Lisinopril 10 mg oral once a day.  8. Omeprazole 20 mg oral once a day.  9. Simvastatin 40 mg oral once a day.   PHYSICAL EXAMINATION:   VITAL SIGNS: Temperature 99.9 and 104 at home, pulse 118, respirations 20, blood pressure 127/72, saturating 95% on oxygen 2 liters.   GENERAL: Obese Caucasian female patient lying in bed in mild respiratory distress, ill-appearing.   PSYCHIATRIC: Alert and oriented x3. Mood and affect appropriate. Judgment intact. Drowsy.   HEENT: Atraumatic, normocephalic. Oral mucosa moist and pink. External ears and nose normal. No pallor. No icterus. Pupils bilaterally equal and reactive to light. No oral thrush.   NECK: Supple. No thyromegaly. No palpable lymph nodes. Trachea midline. No carotid bruit or JVD.   CARDIOVASCULAR: S1, S2 tachycardic without any murmurs. Peripheral pulses 2+. No edema.   RESPIRATORY: Increased work of breathing with bilateral basilar crackles. No wheezing. Normal to percussion.   GI: Soft abdomen, nontender. Bowel sounds present. No hepatosplenomegaly palpable.   SKIN: Warm and dry. No petechiae, rash, ulcers.   MUSCULOSKELETAL: No  joint swelling, redness, or effusion of the large joints. Normal muscle tone.   NEUROLOGIC: Generalized weakness 4/5 all over. Sensation to fine touch  intact all over.   LYMPHATIC: No cervical lymphadenopathy.   LABORATORY, DIAGNOSTIC, AND RADIOLOGICAL DATA: Laboratory studies show glucose 294, BUN 16, creatinine 1.54, sodium 140, potassium 3.8, GFR 32, albumin 2.6, bilirubin 1.9. AST, ALT, alkaline phosphatase normal. WBC 12.8, hemoglobin 13.3, platelets 80. Urinalysis shows no bacteria or WBC.   Chest x-ray shows left lower lobe pneumonia.   ASSESSMENT AND PLAN:  1. Left lower lobe pneumonia with sepsis with tachycardia and elevated white count. Will admit the patient for IV antibiotics. Will start her on Levaquin and also cover for anaerobic organisms considering the patient had vomiting. This could be aspiration pneumonia. Will start on clindamycin. Send for sputum and blood cultures. Oxygen support as needed to keep saturations over 92%. Nebulizer treatment if needed.  2. Nausea and vomiting. This seems to have subsided but the patient does have elevated bilirubin. Will get an ultrasound of the abdomen and look for any gallbladder etiology.  3. Acute renal failure with creatinine 1.54. Last known creatinine was 1.18. Will start on IV fluids and recheck.  4. Thrombocytopenia of 80. The patient seems to have some baseline thrombocytopenia with platelets of 105 in August of 2013 which is mildly worse at this time. Could be secondary to sepsis. Needs to be followed.  5. Breast cancer, presently on radiation treatment with radiation oncology. Outpatient follow-up.  6. Diabetes mellitus. Sliding scale insulin and diabetic diet.   7. DVT prophylaxis with TEDs. No heparin as platelets are low at 80.   CODE STATUS: DNR/DNI as discussed with the patient with daughter and son at bedside.   TIME SPENT: Time spent today on this case was 65 minutes with more than 50% time spent in coordination of care.   ____________________________ Leia Alf. Jahleel Stroschein, MD srs:drc D: 06/16/2012 21:08:10 ET T: 06/17/2012 06:31:10 ET JOB#: [id number]  cc: Alveta Heimlich R.  Omer Puccinelli, MD, <Dictator> Deborra Medina, MD Neita Carp MD ELECTRONICALLY SIGNED 06/18/2012 10:40

## 2015-01-18 NOTE — Consult Note (Signed)
Reason for Visit: This 81 year old Female patient presents to the clinic for initial evaluation of  Breast cancer .   Referred by Dr. Hervey Ard.  Diagnosis:   Chief Complaint/Diagnosis   81 year old female status post wide local excision and sentinel node biopsy for stage I (T1 C. N0 M0) ER positive PR negative HER-2/neu negative invasive mammary carcinoma   Pathology Report Pathology report reviewed    Imaging Report Mammograms ultrasound reviewed    Referral Report Clinical notes reviewed    Planned Treatment Regimen Possible accelerated partial breast radiation    HPI   patient is a 81 year old female who to follow Dr. Hervey Ard for some time for cystic lesions in her right breast. She developed in may of 2013 a persistent intermediate nodule the right upper outer quadrant. She underwent a wide local excision of that site and mammoplasty. Pathology was positive for a 1.5 cm invasive mammary carcinoma ER positive PR negative HER-2/neu not overexpressed. Overall grade was 2. Margins were clear at 0.5 cm. Patient is doing well at this time. She still states she is having some pain in the axillary tail of the right breast. She otherwise is healing well. Patient is quite fatigued at this time.  Past Hx:    H/O Back fracture:    Cataracts:    Arthritis:    Anxiety:    GERD - Esophageal Reflux:    Hypercholesterolemia:    Hypertension:    Diabetes Mellitus, Type II (NIDD):    Eye Surgery - Right: cataract removal and lens implant   Eye Surgery - Left: cataract removal and lens implant   Cataract Extraction:    Breast Surgery: tumor removed   Knee Surgery - Right:    Knee Surgery - Left:   Past, Family and Social History:   Past Medical History positive    Cardiovascular hyperlipidemia; hypertension    Respiratory COPD    Gastrointestinal GERD    Endocrine diabetes mellitus    Neurological/Psychiatric anxiety    Past Surgical History Bilaterally  surgery, cataract extraction    Past Medical History Comments Arthritis    Family History noncontributory    Social History positive    Social History Comments 40-pack-year smoking history, no EtOH his history    Additional Past Medical and Surgical History Accompanied by multiple family members today   Allergies:   Sulfa drugs: Rash  ASA: Other  Vioxx: Other  Eggs: Other  Home Meds:  Home Medications: Medication Instructions Status  Valium 5 mg oral tablet 1 tab(s) orally 3 times a day, As Needed Active  Norco 5 mg-325 mg oral tablet 1 tab(s) orally every 6 hours for pain Active  glipiZIDE 10 mg oral tablet 1 tab(s) orally 2 times a day Active  ferrous sulfate 325 mg (65 mg elemental iron) oral tablet 1 tab(s) orally 2 times a day Active  Symbicort 160 mcg-4.5 mcg/inh inhalation aerosol 2 puff(s) inhaled 2 times a day Active  diazepam 5 mg oral tablet 1 tab(s) orally 2 times a day Active  Vitamin D 1.25 mg 1 cap(s) orally once a week on Saturday Active  omeprazole 20 mg oral delayed release capsule 1 cap(s) orally once a day (in the morning) Active  acetaminophen-codeine 300 mg-30 mg oral tablet 1 tab(s) orally 3 times a day, As Needed. Pt. usually takes at night. Active  lisinopril 10 mg oral tablet 1 tab(s) orally once a day (in the morning) Active  metoclopramide 10 mg oral tablet 1 tab(s)  orally 3 times a day (before meals) Active  simvastatin 40 mg oral tablet 1 tab(s) orally once a day (in the morning) Active  furosemide 20 mg oral tablet 1 tab(s) orally every other day, As Needed Active  cyanocobalamin 1000 mcg/ml 1 dose(s) injectable once a month Active  Levemir FlexPen 100 units/mL subcutaneous solution 14 unit(s) subcutaneous once a day (before supper] Active   Review of Systems:   General negative    Performance Status (ECOG) 0    Skin negative    Breast see HPI    Ophthalmologic see HPI    ENMT negative    Respiratory and Thorax see HPI     Cardiovascular negative    Gastrointestinal negative    Genitourinary negative    Musculoskeletal negative    Neurological negative    Psychiatric see HPI    Hematology/Lymphatics negative    Endocrine negative    Allergic/Immunologic negative   Nursing Notes:  Nursing Vital Signs and Chemo Nursing Nursing Notes: *CC Vital Signs Flowsheet:   18-Jul-13 15:06   Temp Temperature 98.5   Pulse Pulse 94   Respirations Respirations 20   SBP SBP 131   DBP DBP 78   Pain Scale (0-10)  3   Current Weight (kg) (kg) 85   Physical Exam:  General/Skin/HEENT:   General normal    Skin normal    Eyes normal    ENMT normal    Head and Neck normal    Additional PE 81 year old female appears elderly quite fatigued. She is a poor historian. She status post wide local excision of the right breast. Breast is somewhat erythematous although incision is healing well. No dominant mass or nodularity is noted in either breast into position examined. No axillary or supraclavicular adenopathy is appreciated. Lungs are clear to A&P cardiac examination shows regular rate and rhythm. Abdomen is benign.   Breasts/Resp/CV/GI/GU:   Respiratory and Thorax normal    Cardiovascular normal    Gastrointestinal normal    Genitourinary normal   MS/Neuro/Psych/Lymph:   Musculoskeletal normal    Neurological normal    Lymphatics normal   Assessment and Plan:  Impression:   pathologic stage I invasive mammary carcinoma the right breast is post wide local excision and sentinel node biopsy in 81 year old female  Plan:   I believe based on the patient's age small tumor burden and clear margins she would be a good candidate for accelerated partial breast irradiation to the right breast. Other treatment options including whole breast radiation were reviewed with the patient. She would prefer the short course of treatment. I have referred her back to Dr. Tollie Pizza to attempt MammoSite catheter placement.  We will plan on delivering 3400 cGy at 340 cGy twice a day prescribed to 1 cm from the balloon catheter. Risks and benefits of treatment including some erythematous and desquamation the skin over the balloon site, as well as fatigue and persistent scarring in that region of the breast were all explained in detail to the patient and her family.we will coordinate MammoSite catheter placement and treatment planning with Dr. Maryan Char office.  I would like to take this opportunity to thank you for allowing me to continue to participate in this patient's care.  CC Referral:   cc: Dr. Hervey Ard Dr. Nicole Cella   Electronic Signatures: Baruch Gouty, Roda Shutters (MD)  (Signed 18-Jul-13 15:55)  Authored: HPI, Diagnosis, Past Hx, PFSH, Allergies, Home Meds, ROS, Nursing Notes, Physical Exam, Encounter Assessment and Plan, CC Referring Physician  Last Updated: 18-Jul-13 15:55 by Armstead Peaks (MD)

## 2015-01-18 NOTE — Consult Note (Signed)
PATIENT NAME:  Becky Roberts, Becky Roberts MR#:  [id number] DATE OF BIRTH:  [birth year]  DATE OF CONSULTATION:  06/18/2012  REFERRING PHYSICIAN:  Dr. Darvin Neighbours CONSULTING PHYSICIAN:  Robert Bellow, MD PRIMARY CARE PHYSICIAN:  Deborra Medina, M.D.   INDICATION FOR CONSULTATION: Cholelithiasis.   CLINICAL NOTE: This 81 year old woman was admitted on 09/16 with a 4 to 5-day history of progressive weakness, falling, high fever (104 at home), increasing shortness of breath, and vomiting. The patient denied any abdominal pain. She reports that she has had decreased appetite since completing her radiation therapy for breast cancer in August 2013. She had previously had difficulty with nausea and vomiting with pulmonary dysfunction in February 2012.   The patient was admitted, begun on IV antibiotics, and further diagnostic testing instituted. She had an ultrasound completed yesterday showing some thickening of the gallbladder wall and gallstones. She showed no evidence of gallbladder tenderness. Surgical consultation was requested.   The patient was seen this afternoon accompanied by her son, daughter-in-law, and two grandchildren. She reported just not feeling well. She found that her breathing had been improved since admission. She denied any abdominal pain or particular dietary intolerance. She just really reported that she had no appetite.   PHYSICAL EXAMINATION:  VITAL SIGNS: Vital signs on admission showed her to be afebrile and she has had a temperature maximum of 99.3 since admission. Saturations have been reasonable with saturation 94% on 2 liters on admission, 92% on room air at this time. Blood sugars have been under reasonably good control with a high value of 176 recorded. At the time of admission the patient's white blood cell count was 12,800, and platelet count depressed at 80,000. Hemoglobin was 13.3. Present white blood cell count is 10,500 and differential shows a modest left shift with 76% polys.  The patient's albumin is low at 2.2, total bilirubin 1.9 on admission, 1.4 on followup. Normal alkaline phosphatase and transaminase. Electrolytes showed moderate abnormality with an elevated creatinine on admission of 1.5 and a follow-up value of 1.3.   GENERAL: The patient is awake, alert and orientated.   RESPIRATORY: She coughs vigorously although it is nonproductive. Adventitial breath sounds are appreciated on the left more so than the right base. No pleural friction rub. No rales.    CARDIAC: Regular rhythm.   HEENT: Sclerae were clear.   NECK: No cervical adenopathy or thyromegaly.   ABDOMEN: The abdomen was nondistended, soft, and nontender. No tenderness with palpation deep in the right upper quadrant. No peritoneal irritation, guarding, or referred pain.   EXTREMITIES: Femoral pulses were intact. No calf edema or tenderness.   IMPRESSION: The patient has no clear-cut symptoms attributed to her gallbladder. She is presently being treated for pneumonia. I do not think that she would do well with general anesthesia at this time. As she is nontender, observation is warranted. Gallstones were identified during an evaluation in February 2012.  I think her gallbladder wall thickness is more likely due to her low serum albumin than to acute inflammation.   Continue treatment for pneumonia as appropriate, and outpatient followup in regards to her biliary tree would be reasonable.   ________________________ Robert Bellow, MD jwb:bjt D: 06/18/2012 21:11:44 ET T: 06/19/2012 12:50:10 ET JOB#: [id number]  cc: Robert Bellow, MD, <Dictator> Deborra Medina, MD Gaylene Moylan Amedeo Kinsman MD ELECTRONICALLY SIGNED 06/23/2012 8:05

## 2015-01-23 NOTE — Op Note (Signed)
PATIENT NAME:  Becky Roberts, Becky Roberts MR#:  [id number] DATE OF BIRTH:  [birth year]  DATE OF PROCEDURE:  04/01/2012  PREOPERATIVE DIAGNOSIS: Right breast cancer.   POSTOPERATIVE DIAGNOSIS: Right breast cancer.   OPERATIVE PROCEDURES: Right breast wide excision with mastoplasty, sentinel node biopsy.   SURGEON: Robert Bellow, MD  ANESTHESIA: General endotracheal under Dr. Marcello Moores, Marcaine 0.5% with 1:200,000 units epinephrine 30 mL local infiltration.   CLINICAL NOTE: This 81 year old woman was identified with a mass in the upper outer quadrant right breast. Core biopsy showed evidence of infiltrating cancer. Given her options for management, she desired breast conservation.   OPERATIVE NOTE: With the patient under adequate general endotracheal anesthesia, the area of the nipple was prepped with alcohol and a total of 6 mL of methylene blue diluted 1:2 with normal saline was injected in the subareolar plexus. She had previously been injected with technetium sulfur colloid by the radiology department. Scanning through the axilla showed very low counts of approximately 12. The area in the upper outer quadrant of the right breast at the 10:00 position about 8 cm of the nipple was identified by palpation and ultrasound. This was fairly superficially located and it was elected to remove an ellipse of skin so that if appropriate a MammoSite balloon could be placed. The area was infiltrated with Marcaine for postoperative analgesia. The ellipse was excised and during this time a blue lymphatic was identified. This was then followed into a single blue node in the lower aspect of the axilla. Touch preps were negative per Quay Burow, M.D. from pathology. The lumpectomy was completed. This included the deep fascia. The specimen was orientated and specimen radiograph confirmed the previously placed clip to be in the center of the specimen. Gross examination by Dr. Reuel Derby showed no positive margins on gross exam.  The breast was elevated off the deep tissue and approximated with interrupted 2-0 Vicryl figure-of-eight sutures. The adipose tissue was approximately 2 cm depth was mobilized for 3 cm in all directions and then approximated with interrupted 2-0 Vicryl figure-of-eight sutures. A more superficial layer of similar sutures were then placed. The skin was closed with running 4-0 Vicryl subcuticular suture. Benzoin and Steri-Strips followed by a Telfa pad, fluffed gauze, Kerlix and an Ace wrap was then applied.   Patient tolerated procedure well and was taken to recovery room in stable condition.    ____________________________ Robert Bellow, MD jwb:cms D: 04/01/2012 21:00:01 ET T: 04/02/2012 09:13:10 ET JOB#: [id number]  cc: Robert Bellow, MD, <Dictator> Deborra Medina, MD  Aeva Posey Amedeo Kinsman MD ELECTRONICALLY SIGNED 04/03/2012 19:39

## 2015-01-26 ENCOUNTER — Other Ambulatory Visit: Payer: Self-pay | Admitting: Nurse Practitioner

## 2015-01-26 DIAGNOSIS — Z13820 Encounter for screening for osteoporosis: Secondary | ICD-10-CM

## 2015-02-01 ENCOUNTER — Ambulatory Visit: Payer: Medicare HMO

## 2015-02-08 ENCOUNTER — Ambulatory Visit
Admission: RE | Admit: 2015-02-08 | Discharge: 2015-02-08 | Disposition: A | Discharge status: Home / Self Care | Payer: Medicare HMO | Source: Ambulatory Visit | Attending: Nurse Practitioner | Admitting: Nurse Practitioner

## 2015-02-08 DIAGNOSIS — Z13820 Encounter for screening for osteoporosis: Secondary | ICD-10-CM | POA: Diagnosis present

## 2015-02-08 DIAGNOSIS — M810 Age-related osteoporosis without current pathological fracture: Secondary | ICD-10-CM | POA: Diagnosis not present

## 2015-04-14 ENCOUNTER — Inpatient Hospital Stay
Admission: EM | Admit: 2015-04-14 | Discharge: 2015-04-18 | DRG: 372 | Disposition: A | Discharge status: Home / Self Care | Payer: Medicare HMO | Attending: Internal Medicine | Admitting: Internal Medicine

## 2015-04-14 ENCOUNTER — Encounter: Payer: Self-pay | Admitting: Emergency Medicine

## 2015-04-14 ENCOUNTER — Emergency Department: Payer: Medicare HMO

## 2015-04-14 DIAGNOSIS — Z9119 Patient's noncompliance with other medical treatment and regimen: Secondary | ICD-10-CM | POA: Diagnosis present

## 2015-04-14 DIAGNOSIS — R112 Nausea with vomiting, unspecified: Secondary | ICD-10-CM

## 2015-04-14 DIAGNOSIS — J9811 Atelectasis: Secondary | ICD-10-CM | POA: Diagnosis present

## 2015-04-14 DIAGNOSIS — I5032 Chronic diastolic (congestive) heart failure: Secondary | ICD-10-CM | POA: Diagnosis present

## 2015-04-14 DIAGNOSIS — Z66 Do not resuscitate: Secondary | ICD-10-CM | POA: Diagnosis present

## 2015-04-14 DIAGNOSIS — Z87891 Personal history of nicotine dependence: Secondary | ICD-10-CM | POA: Diagnosis not present

## 2015-04-14 DIAGNOSIS — K652 Spontaneous bacterial peritonitis: Secondary | ICD-10-CM | POA: Diagnosis present

## 2015-04-14 DIAGNOSIS — I8500 Esophageal varices without bleeding: Secondary | ICD-10-CM | POA: Diagnosis present

## 2015-04-14 DIAGNOSIS — Z8261 Family history of arthritis: Secondary | ICD-10-CM

## 2015-04-14 DIAGNOSIS — R109 Unspecified abdominal pain: Secondary | ICD-10-CM | POA: Diagnosis present

## 2015-04-14 DIAGNOSIS — E119 Type 2 diabetes mellitus without complications: Secondary | ICD-10-CM | POA: Diagnosis present

## 2015-04-14 DIAGNOSIS — Z833 Family history of diabetes mellitus: Secondary | ICD-10-CM | POA: Diagnosis not present

## 2015-04-14 DIAGNOSIS — Z8249 Family history of ischemic heart disease and other diseases of the circulatory system: Secondary | ICD-10-CM

## 2015-04-14 DIAGNOSIS — G894 Chronic pain syndrome: Secondary | ICD-10-CM | POA: Diagnosis present

## 2015-04-14 DIAGNOSIS — M1990 Unspecified osteoarthritis, unspecified site: Secondary | ICD-10-CM | POA: Diagnosis present

## 2015-04-14 DIAGNOSIS — I700 Atherosclerosis of aorta: Secondary | ICD-10-CM | POA: Diagnosis present

## 2015-04-14 DIAGNOSIS — Z794 Long term (current) use of insulin: Secondary | ICD-10-CM | POA: Diagnosis not present

## 2015-04-14 DIAGNOSIS — D631 Anemia in chronic kidney disease: Secondary | ICD-10-CM | POA: Diagnosis present

## 2015-04-14 DIAGNOSIS — Z882 Allergy status to sulfonamides status: Secondary | ICD-10-CM

## 2015-04-14 DIAGNOSIS — K8020 Calculus of gallbladder without cholecystitis without obstruction: Secondary | ICD-10-CM | POA: Diagnosis present

## 2015-04-14 DIAGNOSIS — Z79899 Other long term (current) drug therapy: Secondary | ICD-10-CM

## 2015-04-14 DIAGNOSIS — Z6835 Body mass index (BMI) 35.0-35.9, adult: Secondary | ICD-10-CM | POA: Diagnosis not present

## 2015-04-14 DIAGNOSIS — K7460 Unspecified cirrhosis of liver: Secondary | ICD-10-CM | POA: Diagnosis present

## 2015-04-14 DIAGNOSIS — Z888 Allergy status to other drugs, medicaments and biological substances status: Secondary | ICD-10-CM

## 2015-04-14 DIAGNOSIS — J45909 Unspecified asthma, uncomplicated: Secondary | ICD-10-CM | POA: Diagnosis present

## 2015-04-14 DIAGNOSIS — E669 Obesity, unspecified: Secondary | ICD-10-CM | POA: Diagnosis present

## 2015-04-14 DIAGNOSIS — N179 Acute kidney failure, unspecified: Secondary | ICD-10-CM | POA: Diagnosis present

## 2015-04-14 DIAGNOSIS — R609 Edema, unspecified: Secondary | ICD-10-CM

## 2015-04-14 DIAGNOSIS — I129 Hypertensive chronic kidney disease with stage 1 through stage 4 chronic kidney disease, or unspecified chronic kidney disease: Secondary | ICD-10-CM | POA: Diagnosis present

## 2015-04-14 DIAGNOSIS — J449 Chronic obstructive pulmonary disease, unspecified: Secondary | ICD-10-CM | POA: Diagnosis present

## 2015-04-14 DIAGNOSIS — G2581 Restless legs syndrome: Secondary | ICD-10-CM | POA: Diagnosis present

## 2015-04-14 DIAGNOSIS — F419 Anxiety disorder, unspecified: Secondary | ICD-10-CM | POA: Diagnosis present

## 2015-04-14 DIAGNOSIS — Z7951 Long term (current) use of inhaled steroids: Secondary | ICD-10-CM

## 2015-04-14 DIAGNOSIS — N183 Chronic kidney disease, stage 3 (moderate): Secondary | ICD-10-CM | POA: Diagnosis present

## 2015-04-14 DIAGNOSIS — K529 Noninfective gastroenteritis and colitis, unspecified: Secondary | ICD-10-CM | POA: Diagnosis present

## 2015-04-14 DIAGNOSIS — Z91119 Patient's noncompliance with dietary regimen due to unspecified reason: Secondary | ICD-10-CM

## 2015-04-14 DIAGNOSIS — R1084 Generalized abdominal pain: Secondary | ICD-10-CM | POA: Diagnosis present

## 2015-04-14 DIAGNOSIS — K219 Gastro-esophageal reflux disease without esophagitis: Secondary | ICD-10-CM | POA: Diagnosis present

## 2015-04-14 DIAGNOSIS — N390 Urinary tract infection, site not specified: Secondary | ICD-10-CM | POA: Diagnosis present

## 2015-04-14 DIAGNOSIS — Z9981 Dependence on supplemental oxygen: Secondary | ICD-10-CM

## 2015-04-14 DIAGNOSIS — R188 Other ascites: Secondary | ICD-10-CM | POA: Diagnosis present

## 2015-04-14 DIAGNOSIS — Z9111 Patient's noncompliance with dietary regimen: Secondary | ICD-10-CM

## 2015-04-14 DIAGNOSIS — K766 Portal hypertension: Secondary | ICD-10-CM | POA: Diagnosis present

## 2015-04-14 DIAGNOSIS — Z853 Personal history of malignant neoplasm of breast: Secondary | ICD-10-CM

## 2015-04-14 DIAGNOSIS — Z9889 Other specified postprocedural states: Secondary | ICD-10-CM

## 2015-04-14 HISTORY — DX: Calculus of gallbladder without cholecystitis without obstruction: K80.20

## 2015-04-14 HISTORY — DX: Unspecified cirrhosis of liver: K74.60

## 2015-04-14 HISTORY — DX: Chronic kidney disease, stage 3 unspecified: N18.30

## 2015-04-14 HISTORY — DX: Chronic kidney disease, stage 3 (moderate): N18.3

## 2015-04-14 LAB — URINALYSIS COMPLETE WITH MICROSCOPIC (ARMC ONLY)
Bilirubin Urine: NEGATIVE
Glucose, UA: NEGATIVE mg/dL
Hgb urine dipstick: NEGATIVE
NITRITE: NEGATIVE
PROTEIN: NEGATIVE mg/dL
SPECIFIC GRAVITY, URINE: 1.023 (ref 1.005–1.030)
pH: 5 (ref 5.0–8.0)

## 2015-04-14 LAB — CBC
HEMATOCRIT: 41.6 % (ref 35.0–47.0)
Hemoglobin: 13.8 g/dL (ref 12.0–16.0)
MCH: 31.9 pg (ref 26.0–34.0)
MCHC: 33.1 g/dL (ref 32.0–36.0)
MCV: 96.5 fL (ref 80.0–100.0)
Platelets: 159 10*3/uL (ref 150–440)
RBC: 4.32 MIL/uL (ref 3.80–5.20)
RDW: 14 % (ref 11.5–14.5)
WBC: 6.6 10*3/uL (ref 3.6–11.0)

## 2015-04-14 LAB — COMPREHENSIVE METABOLIC PANEL
ALT: 31 U/L (ref 14–54)
ANION GAP: 6 (ref 5–15)
AST: 46 U/L — AB (ref 15–41)
Albumin: 2.5 g/dL — ABNORMAL LOW (ref 3.5–5.0)
Alkaline Phosphatase: 105 U/L (ref 38–126)
BUN: 13 mg/dL (ref 6–20)
CALCIUM: 8.4 mg/dL — AB (ref 8.9–10.3)
CO2: 24 mmol/L (ref 22–32)
Chloride: 111 mmol/L (ref 101–111)
Creatinine, Ser: 1.31 mg/dL — ABNORMAL HIGH (ref 0.44–1.00)
GFR, EST AFRICAN AMERICAN: 43 mL/min — AB (ref >60)
GFR, EST NON AFRICAN AMERICAN: 37 mL/min — AB (ref >60)
GLUCOSE: 126 mg/dL — AB (ref 65–99)
Potassium: 3.9 mmol/L (ref 3.5–5.1)
Sodium: 141 mmol/L (ref 135–145)
TOTAL PROTEIN: 6.2 g/dL — AB (ref 6.5–8.1)
Total Bilirubin: 1.2 mg/dL (ref 0.3–1.2)

## 2015-04-14 LAB — LIPASE, BLOOD: Lipase: 30 U/L (ref 22–51)

## 2015-04-14 LAB — PROTIME-INR
INR: 1.52
PROTHROMBIN TIME: 18.5 s — AB (ref 11.4–15.0)

## 2015-04-14 LAB — C DIFFICILE QUICK SCREEN W PCR REFLEX
C DIFFICLE (CDIFF) ANTIGEN: NEGATIVE
C Diff interpretation: NEGATIVE
C Diff toxin: NEGATIVE

## 2015-04-14 LAB — GLUCOSE, CAPILLARY: GLUCOSE-CAPILLARY: 99 mg/dL (ref 65–99)

## 2015-04-14 MED ORDER — ENOXAPARIN SODIUM 40 MG/0.4ML ~~LOC~~ SOLN
40.0000 mg | SUBCUTANEOUS | Status: DC
  Administered 2015-04-14 – 2015-04-15 (×2): 40 mg via SUBCUTANEOUS
  Filled 2015-04-14 (×2): qty 0.4

## 2015-04-14 MED ORDER — IOHEXOL 300 MG/ML  SOLN
80.0000 mL | Freq: Once | INTRAMUSCULAR | Status: AC | PRN
  Administered 2015-04-14: 80 mL via INTRAVENOUS

## 2015-04-14 MED ORDER — ALBUTEROL SULFATE (2.5 MG/3ML) 0.083% IN NEBU: 2.5000 mg | INHALATION_SOLUTION | RESPIRATORY_TRACT | Status: DC | PRN

## 2015-04-14 MED ORDER — CEFTRIAXONE SODIUM IN DEXTROSE 20 MG/ML IV SOLN
1.0000 g | Freq: Once | INTRAVENOUS | Status: AC
  Administered 2015-04-14: 1 g via INTRAVENOUS
  Filled 2015-04-14: qty 50

## 2015-04-14 MED ORDER — ACETAMINOPHEN 325 MG PO TABS: 650.0000 mg | ORAL_TABLET | Freq: Four times a day (QID) | ORAL | Status: DC | PRN

## 2015-04-14 MED ORDER — IOHEXOL 240 MG/ML SOLN
25.0000 mL | Freq: Once | INTRAMUSCULAR | Status: AC | PRN
  Administered 2015-04-14: 25 mL via INTRAVENOUS

## 2015-04-14 MED ORDER — INSULIN ASPART 100 UNIT/ML ~~LOC~~ SOLN
0.0000 [IU] | SUBCUTANEOUS | Status: DC
  Administered 2015-04-15: 1 [IU] via SUBCUTANEOUS
  Administered 2015-04-16: 2 [IU] via SUBCUTANEOUS
  Administered 2015-04-16 – 2015-04-17 (×3): 1 [IU] via SUBCUTANEOUS
  Administered 2015-04-18: 13:00:00 2 [IU] via SUBCUTANEOUS
  Filled 2015-04-14: qty 1
  Filled 2015-04-14 (×2): qty 2
  Filled 2015-04-14 (×3): qty 1

## 2015-04-14 MED ORDER — ONDANSETRON HCL 4 MG/2ML IJ SOLN
4.0000 mg | Freq: Once | INTRAMUSCULAR | Status: AC
  Administered 2015-04-14: 4 mg via INTRAVENOUS
  Filled 2015-04-14: qty 2

## 2015-04-14 MED ORDER — MORPHINE SULFATE 2 MG/ML IJ SOLN: 2.0000 mg | INTRAMUSCULAR | Status: DC | PRN

## 2015-04-14 MED ORDER — SPIRONOLACTONE 25 MG PO TABS
25.0000 mg | ORAL_TABLET | Freq: Every day | ORAL | Status: DC
  Administered 2015-04-16: 25 mg via ORAL
  Filled 2015-04-14 (×3): qty 1

## 2015-04-14 MED ORDER — ACETAMINOPHEN 650 MG RE SUPP: 650.0000 mg | Freq: Four times a day (QID) | RECTAL | Status: DC | PRN

## 2015-04-14 MED ORDER — OXYCODONE HCL 5 MG PO TABS
5.0000 mg | ORAL_TABLET | ORAL | Status: DC | PRN
  Administered 2015-04-15 – 2015-04-17 (×5): 5 mg via ORAL
  Filled 2015-04-14 (×6): qty 1

## 2015-04-14 MED ORDER — ONDANSETRON HCL 4 MG PO TABS
4.0000 mg | ORAL_TABLET | Freq: Four times a day (QID) | ORAL | Status: DC | PRN
  Administered 2015-04-17: 18:00:00 4 mg via ORAL
  Filled 2015-04-14: qty 1

## 2015-04-14 MED ORDER — FUROSEMIDE 20 MG PO TABS
20.0000 mg | ORAL_TABLET | Freq: Every day | ORAL | Status: DC
  Filled 2015-04-14: qty 1

## 2015-04-14 MED ORDER — SODIUM CHLORIDE 0.9 % IV SOLN
1000.0000 mL | Freq: Once | INTRAVENOUS | Status: AC
  Administered 2015-04-14: 1000 mL via INTRAVENOUS

## 2015-04-14 MED ORDER — CEFTRIAXONE SODIUM IN DEXTROSE 20 MG/ML IV SOLN
1.0000 g | INTRAVENOUS | Status: DC
  Administered 2015-04-14: 1 g via INTRAVENOUS
  Filled 2015-04-14 (×2): qty 50

## 2015-04-14 MED ORDER — METRONIDAZOLE IN NACL 5-0.79 MG/ML-% IV SOLN
500.0000 mg | Freq: Three times a day (TID) | INTRAVENOUS | Status: DC
  Administered 2015-04-15 – 2015-04-18 (×11): 500 mg via INTRAVENOUS
  Filled 2015-04-14 (×15): qty 100

## 2015-04-14 MED ORDER — ONDANSETRON HCL 4 MG/2ML IJ SOLN: 4.0000 mg | Freq: Four times a day (QID) | INTRAMUSCULAR | Status: DC | PRN

## 2015-04-14 NOTE — ED Provider Notes (Signed)
[facility] Emergency Department Provider Note  ____________________________________________  Time seen: On arrival  I have reviewed the triage vital signs and the nursing notes.   HISTORY  Chief Complaint Nausea; Emesis; and Diarrhea    HPI Becky Roberts is a 81 y.o. female who presents with nausea vomiting and diarrhea 1-2 days. Daughters in the room but does not know a lot about the situation and this patient with son. reportedly patient developed nausea vomiting and diarrhea last night. She also complains of abdominal pain. Positive chills and temperature was not checked. No dysuria.     Past Medical History  Diagnosis Date  . Blood transfusion reaction   . Diabetes mellitus   . Depression   . Hypertension   . Anemia 2012    hgb 5.5, admitted,  workup multifactorial,  b12 deficiency  . Arthritis   . Asthma   . Anemia in chronic kidney disease(285.21)   . Portal hypertension February 2014    CT suggested a small nodular liver, multiple varices adjacent to the spleen and inferior mediastinum and paraesophageal region. No ascites.  . Breast cancer July 2,2013    T1c,N0,M0. ER + PR- , HER-2/neu not overexpressing..    Patient Active Problem List   Diagnosis Date Noted  . Cirrhosis of liver not due to alcohol 02/14/2014  . Chronic pain syndrome 01/14/2014  . Diarrhea 01/14/2014  . Urinary frequency 01/14/2014  . Gingivitis, chronic 01/13/2014  . Noncompliance of patient with dietary regimen 09/07/2013  . History of breast cancer 08/17/2013  . Dental infection 05/02/2013  . Restless legs syndrome 01/21/2013  . Chronic diastolic congestive heart failure 12/03/2012  . Chronic cholecystitis with calculus 11/05/2012  . Obesity (BMI 30-39.9) 04/17/2012  . Type II or unspecified type diabetes mellitus without mention of complication, not stated as uncontrolled   . Depression   . Hypertension   . Arthritis   . COPD (chronic obstructive  pulmonary disease)   . Breast cancer 03/31/2012  . Generalized muscle weakness 03/06/2012    Past Surgical History  Procedure Laterality Date  . Breast biopsy  July 2013    [name]: wide excision   . Colonoscopy  2012    Current Outpatient Rx  Name  Route  Sig  Dispense  Refill  . albuterol (PROVENTIL HFA;VENTOLIN HFA) 108 (90 BASE) MCG/ACT inhaler   Inhalation   Inhale 2 puffs into the lungs every 6 (six) hours as needed for wheezing.   1 Inhaler   11   . Calcium Carbonate-Vitamin D (CALCARB 600/D) 600-400 MG-UNIT per tablet   Oral   Take 2 tablets by mouth daily.         . carboxymethylcellulose (REFRESH) 1 % ophthalmic solution      1 drop as needed.         . chlorhexidine (PERIDEX) 0.12 % solution   Mouth/Throat   Use as directed 15 mLs in the mouth or throat 2 (two) times daily.   120 mL   2   . Cholecalciferol (VITAMIN D PO)   Oral   Take 1.25 mg by mouth once a week.         . cyanocobalamin (,VITAMIN B-12,) 1000 MCG/ML injection   Intramuscular   Inject 1 mL (1,000 mcg total) into the muscle every 30 (thirty) days.   10 mL   12   . cyanocobalamin (,VITAMIN B-12,) 1000 MCG/ML injection      INJECT 1ML INTO THE MUSCLE EVERY 30 DAYS  1 mL   3   . diazepam (VALIUM) 5 MG tablet      TAKE ONE (1) TABLET THREE (3) TIMES EACH DAY   60 tablet   0   . exemestane (AROMASIN) 25 MG tablet   Oral   Take 25 mg by mouth daily after breakfast.         . HYDROcodone-acetaminophen (NORCO) 10-325 MG per tablet   Oral   Take 1 tablet by mouth every 6 (six) hours as needed.   120 tablet   0   . insulin detemir (LEVEMIR) 100 UNIT/ML injection      16 to 20 units daily   15 mL   12   . Insulin Syringe-Needle U-100 31G X 5/16" 1 ML MISC      TEST BLOOD SUGARS TWO TIMES DAILY.   100 each   5   . losartan (COZAAR) 100 MG tablet   Oral   Take 1 tablet (100 mg total) by mouth daily.   90 tablet   3   . metoCLOPramide (REGLAN) 10 MG tablet       Take one tablet by mouth 30 minutes before meals         . metoprolol tartrate (LOPRESSOR) 25 MG tablet      TAKE ONE TABLET TWICE DAILY   180 tablet   3   . NOVOLOG MIX 70/30 (70-30) 100 UNIT/ML injection      INJECT 38 UNITS WITH BREAKFAST AND 40 UNITS WITH SUPPER   10 mL   2   . omeprazole (PRILOSEC) 20 MG capsule      TAKE ONE (1) CAPSULE EACH DAY   30 capsule   5   . ondansetron (ZOFRAN) 4 MG tablet   Oral   Take 1 tablet (4 mg total) by mouth every 8 (eight) hours as needed.   90 tablet   3   . Polyethyl Glycol-Propyl Glycol (SYSTANE OP)   Ophthalmic   Apply to eye as needed.         Marland Kitchen PROAIR HFA 108 (90 BASE) MCG/ACT inhaler      INHALE 2 PUFFS INTO THE LUNGS EVERY 6 HOURS AS NEEDED FOR WHEEZING   8.5 g   1   . promethazine (PHENERGAN) 12.5 MG tablet   Oral   Take 1 tablet (12.5 mg total) by mouth every 8 (eight) hours as needed for nausea or vomiting.   20 tablet   0   . rOPINIRole (REQUIP) 0.5 MG tablet      TAKE 1 TABLET BY MOUTH ONE TO THREE HOURS BEFORE BEDTIME.  INCREASE DOSE WEEKLY AS NEEDED   60 tablet   3   . simvastatin (ZOCOR) 40 MG tablet      TAKE ONE (1) TABLET EACH DAY   30 tablet   3   . SYRINGE-NEEDLE, DISP, 3 ML (B-D SYRINGE/NEEDLE 3CC/25GX5/8) 25G X 5/8" 3 ML MISC      Use once a day   50 each   5   . Syringe/Needle, Disp, (SYRINGE LUER LOCK) 25G X 5/8" 3 ML MISC   Does not apply   1 application by Does not apply route every 30 (thirty) days.   50 each   0   . TDaP (BOOSTRIX) 5-2.5-18.5 LF-MCG/0.5 injection   Intramuscular   Inject 0.5 mLs into the muscle once.   0.5 mL   0   . Vitamin D, Ergocalciferol, (DRISDOL) 50000 UNITS CAPS capsule      TAKE  1 CAPSULE BY MOUTH EVERY WEEK   4 capsule   3     Allergies Aspirin and Sulfa drugs cross reactors  Family History  Problem Relation Age of Onset  . Arthritis Mother   . Hypertension Mother   . Diabetes Mother   . Arthritis Father   . Hypertension Father    . Diabetes Father   . Arthritis Other   . Cancer Other     breast/lung    Social History History  Substance Use Topics  . Smoking status: Former Research scientist (life sciences)  . Smokeless tobacco: Never Used  . Alcohol Use: No    Review of Systems  Constitutional: Negative for fever. Eyes: Negative for visual changes. ENT: Negative for sore throat Cardiovascular: Negative for chest pain. Respiratory: Negative for shortness of breath. Gastrointestinal: Positive for vomiting and diarrhea Genitourinary: Negative for dysuria. Musculoskeletal: Negative for back pain. Skin: Negative for rash. Neurological: Negative for headaches or focal weakness Psychiatric positive for anxiety  10-point ROS otherwise negative.  ____________________________________________   PHYSICAL EXAM:  VITAL SIGNS: ED Triage Vitals  Enc Vitals Group     BP 04/14/15 1336 157/88 mmHg     Pulse Rate 04/14/15 1336 79     Resp 04/14/15 1336 22     Temp 04/14/15 1336 98.6 F (37 C)     Temp Source 04/14/15 1336 Oral     SpO2 04/14/15 1336 96 %     Weight 04/14/15 1336 185 lb (83.915 kg)     Height 04/14/15 1336 5' 5"  (1.651 m)     Head Cir --      Peak Flow --      Pain Score --      Pain Loc --      Pain Edu? --      Excl. in [place]? --      Constitutional: Alert and oriented. Actively vomiting upon my exam Eyes: Conjunctivae are normal.  ENT   Head: Normocephalic and atraumatic.   Mouth/Throat: Mucous membranes are moist. Cardiovascular: Normal rate, regular rhythm. Normal and symmetric distal pulses are present in all extremities. No murmurs, rubs, or gallops. Respiratory: Normal respiratory effort without tachypnea nor retractions. Breath sounds are clear and equal bilaterally.  Gastrointestinal: Mild tenderness diffusely. Mild distention. There is no CVA tenderness. Genitourinary: deferred Musculoskeletal: Nontender with normal range of motion in all extremities. No lower extremity tenderness nor  edema. Neurologic:  Normal speech and language. No gross focal neurologic deficits are appreciated. Skin:  Skin is warm, dry and intact. No rash noted. Psychiatric: Mood and affect are normal. Patient exhibits appropriate insight and judgment.  ____________________________________________    LABS (pertinent positives/negatives)  Labs Reviewed  URINALYSIS COMPLETEWITH MICROSCOPIC ([facility] ONLY) - Abnormal; Notable for the following:    Color, Urine AMBER (*)    APPearance HAZY (*)    Ketones, ur TRACE (*)    Leukocytes, UA 2+ (*)    Bacteria, UA RARE (*)    Squamous Epithelial / LPF 0-5 (*)    All other components within normal limits  CBC  LIPASE, BLOOD  COMPREHENSIVE METABOLIC PANEL    ____________________________________________   EKG  None  ____________________________________________    RADIOLOGY I have personally reviewed any xrays that were ordered on this patient: CT scan pending  ____________________________________________   PROCEDURES  Procedure(s) performed: none  Critical Care performed: none  ____________________________________________   INITIAL IMPRESSION / ASSESSMENT AND PLAN / ED COURSE  Pertinent labs & imaging results that were available during my  care of the patient were reviewed by me and considered in my medical decision making (see chart for details).  Patient with nausea vomiting and diarrhea with multiple comorbidities. Mild tenderness to palpation diffusely with some distention. We will obtain CT abdomen pelvis. We will give normal saline and Zofran. Patient will require reevaluation. I will sign out to Dr. Dineen Kid  ____________________________________________   FINAL CLINICAL IMPRESSION(S) / ED DIAGNOSES  Final diagnoses:  Generalized abdominal pain  Non-intractable vomiting with nausea, vomiting of unspecified type     Lavonia Drafts, MD 04/14/15 [phone]

## 2015-04-14 NOTE — ED Notes (Signed)
Pt reports n/v/d and chill

## 2015-04-14 NOTE — ED Notes (Signed)
Patient transported to CT 

## 2015-04-14 NOTE — ED Notes (Signed)
ED Medic Les into draw labs on pt due to hard stick, prior to pt going to floor

## 2015-04-14 NOTE — H&P (Signed)
[place] [facility] [place] NAME: Becky Roberts    MR#:  [id number]  DATE OF BIRTH:  06-Apr-1932  DATE OF ADMISSION:  04/14/2015  PRIMARY CARE PHYSICIAN: No primary care provider on file.   REQUESTING/REFERRING PHYSICIAN: Dr. Dineen Kid  CHIEF COMPLAINT:   Chief Complaint  Patient presents with  . Nausea  . Emesis  . Diarrhea    HISTORY OF PRESENT ILLNESS:  Becky Roberts  is a 81 y.o. female with a known history of retention, diabetes, cirrhosis of unknown etiology presents to the emergency room complaining of 2 days of abdominal pain, vomiting and diarrhea. She mentions that she has had on and off vomiting and abdominal pain for 8 years. Also on and off diarrhea. Pain is worse today and came to the emergency room due to her son. She does not take any pain medications at home. She has noticed no melena or blood in stools. No hematochezia. Does not drink alcohol. Here she has been found to have moderate ascites, cirrhosis, enteritis on CT scan of the abdomen. She is not aware of having cirrhosis. CT scan of the abdomen checked in 2014 showed varices and cirrhosis. She was supposed to have cholecystectomy due to cholelithiasis for her chronic abdominal pain but this was held due to cirrhosis.  PAST MEDICAL HISTORY:   Past Medical History  Diagnosis Date  . Blood transfusion reaction   . Diabetes mellitus   . Depression   . Hypertension   . Anemia 2012    hgb 5.5, admitted,  workup multifactorial,  b12 deficiency  . Arthritis   . Asthma   . Anemia in chronic kidney disease(285.21)   . Portal hypertension February 2014    CT suggested a small nodular liver, multiple varices adjacent to the spleen and inferior mediastinum and paraesophageal region. No ascites.  . Breast cancer July 2,2013    T1c,N0,M0. ER + PR- , HER-2/neu not overexpressing..  . Cirrhosis   . Cholelithiases   . CKD (chronic kidney disease) stage 3, GFR 30-59  ml/min     PAST SURGICAL HISTORY:   Past Surgical History  Procedure Laterality Date  . Breast biopsy  July 2013    [name]: wide excision   . Colonoscopy  2012    SOCIAL HISTORY:   History  Substance Use Topics  . Smoking status: Former Research scientist (life sciences)  . Smokeless tobacco: Never Used  . Alcohol Use: No    FAMILY HISTORY:   Family History  Problem Relation Age of Onset  . Arthritis Mother   . Hypertension Mother   . Diabetes Mother   . Arthritis Father   . Hypertension Father   . Diabetes Father   . Arthritis Other   . Cancer Other     breast/lung    DRUG ALLERGIES:   Allergies  Allergen Reactions  . Sulfa Antibiotics Hives  . Vioxx [Rofecoxib] Hives and Itching  . Aspirin Palpitations    REVIEW OF SYSTEMS:   Review of Systems  Constitutional: Negative for fever, chills and weight loss.  HENT: Negative for hearing loss and nosebleeds.   Eyes: Negative for blurred vision, double vision and pain.  Respiratory: Negative for cough, hemoptysis, sputum production, shortness of breath and wheezing.   Cardiovascular: Negative for chest pain, palpitations, orthopnea and leg swelling.  Gastrointestinal: Positive for nausea, vomiting, abdominal pain and diarrhea. Negative for constipation.  Genitourinary: Negative for dysuria and hematuria.  Musculoskeletal: Positive for back pain and falls. Negative for  myalgias.  Skin: Negative for rash.  Neurological: Positive for weakness. Negative for dizziness, tremors, sensory change, speech change, focal weakness, seizures and headaches.  Endo/Heme/Allergies: Does not bruise/bleed easily.  Psychiatric/Behavioral: Positive for depression. Negative for memory loss. The patient is not nervous/anxious.     MEDICATIONS AT HOME:   Prior to Admission medications   Medication Sig Start Date End Date Taking? Authorizing Provider  albuterol (PROVENTIL HFA;VENTOLIN HFA) 108 (90 BASE) MCG/ACT inhaler Inhale 2 puffs into the lungs every 6  (six) hours as needed for wheezing. 12/17/12  Yes Crecencio Mc, MD  Calcium Carbonate-Vitamin D (CALTRATE 600+D PO) Take 2 tablets by mouth daily.   Yes Historical Provider, MD  chlorhexidine (PERIDEX) 0.12 % solution Use as directed 15 mLs in the mouth or throat 2 (two) times daily. 01/13/14  Yes Crecencio Mc, MD  cyanocobalamin (,VITAMIN B-12,) 1000 MCG/ML injection Inject 1 mL (1,000 mcg total) into the muscle every 30 (thirty) days. Patient taking differently: Inject 1,000 mcg into the muscle every 30 (thirty) days. Pt uses on the 28th of every month. 07/09/12  Yes Crecencio Mc, MD  diazepam (VALIUM) 2 MG tablet Take 2 mg by mouth 3 (three) times daily as needed for anxiety.   Yes Historical Provider, MD  exemestane (AROMASIN) 25 MG tablet Take 25 mg by mouth daily.    Yes Historical Provider, MD  HYDROcodone-acetaminophen (NORCO) 10-325 MG per tablet Take 1 tablet by mouth every 6 (six) hours as needed. Patient taking differently: Take 1 tablet by mouth every 6 (six) hours as needed for moderate pain.  02/12/14  Yes Crecencio Mc, MD  insulin aspart protamine- aspart (NOVOLOG MIX 70/30) (70-30) 100 UNIT/ML injection Inject 38-40 Units into the skin 2 (two) times daily with a meal. Pt uses 38 units with breakfast and 40 units with dinner.   Yes Historical Provider, MD  insulin detemir (LEVEMIR) 100 UNIT/ML injection 16 to 20 units daily Patient taking differently: Inject 16-20 Units into the skin 3 (three) times daily as needed (for high blood sugar). Pt uses as needed per sliding scale. 12/16/12  Yes Crecencio Mc, MD  losartan (COZAAR) 100 MG tablet Take 1 tablet (100 mg total) by mouth daily. 12/08/13  Yes Crecencio Mc, MD  metoCLOPramide (REGLAN) 10 MG tablet Take 10 mg by mouth 3 (three) times daily before meals.    Yes Historical Provider, MD  metoprolol tartrate (LOPRESSOR) 25 MG tablet TAKE ONE TABLET TWICE DAILY Patient taking differently: Take 25 mg by mouth 2 (two) times daily.   04/14/14  Yes Minna Merritts, MD  omeprazole (PRILOSEC) 20 MG capsule Take 20 mg by mouth daily.   Yes Historical Provider, MD  rOPINIRole (REQUIP) 0.5 MG tablet Take 1 mg by mouth at bedtime.   Yes Historical Provider, MD  simvastatin (ZOCOR) 40 MG tablet Take 40 mg by mouth at bedtime.   Yes Historical Provider, MD  Vitamin D, Ergocalciferol, (DRISDOL) 50000 UNITS CAPS capsule Take 50,000 Units by mouth every 7 (seven) days. Pt takes on Monday.   Yes Historical Provider, MD  cyanocobalamin (,VITAMIN B-12,) 1000 MCG/ML injection INJECT 1ML INTO THE MUSCLE EVERY 30 DAYS Patient not taking: Reported on 04/14/2015 05/31/14   Crecencio Mc, MD  diazepam (VALIUM) 5 MG tablet TAKE ONE (1) TABLET THREE (3) TIMES EACH DAY Patient not taking: Reported on 04/14/2015    Crecencio Mc, MD  Insulin Syringe-Needle U-100 31G X 5/16" 1 ML MISC TEST BLOOD SUGARS  TWO TIMES DAILY. Patient not taking: Reported on 04/14/2015 09/21/13   Crecencio Mc, MD  ondansetron (ZOFRAN) 4 MG tablet Take 1 tablet (4 mg total) by mouth every 8 (eight) hours as needed. Patient not taking: Reported on 04/14/2015 12/16/12   Crecencio Mc, MD  PROAIR HFA 108 (90 BASE) MCG/ACT inhaler INHALE 2 PUFFS INTO THE LUNGS EVERY 6 HOURS AS NEEDED FOR WHEEZING Patient not taking: Reported on 04/14/2015 03/08/14   Crecencio Mc, MD  promethazine (PHENERGAN) 12.5 MG tablet Take 1 tablet (12.5 mg total) by mouth every 8 (eight) hours as needed for nausea or vomiting. Patient not taking: Reported on 04/14/2015 12/11/13   Sherryl Barters, NP  SYRINGE-NEEDLE, DISP, 3 ML (B-D SYRINGE/NEEDLE 3CC/25GX5/8) 25G X 5/8" 3 ML MISC Use once a day Patient not taking: Reported on 04/14/2015 09/16/12   Crecencio Mc, MD  Syringe/Needle, Disp, (SYRINGE LUER LOCK) 25G X 5/8" 3 ML MISC 1 application by Does not apply route every 30 (thirty) days. Patient not taking: Reported on 04/14/2015 03/06/12   Crecencio Mc, MD  TDaP Durwin Reges) 5-2.5-18.5 LF-MCG/0.5 injection Inject  0.5 mLs into the muscle once. Patient not taking: Reported on 04/14/2015 04/30/13   Crecencio Mc, MD      VITAL SIGNS:  Blood pressure 127/62, pulse 84, temperature 98.6 F (37 C), temperature source Oral, resp. rate 31, height _0  (1.651 m), weight 83.915 kg (185 lb), SpO2 98 %.  PHYSICAL EXAMINATION:  Physical Exam  GENERAL:  81 y.o.-year-old patient lying in the bed with no acute distress.  EYES: Pupils equal, round, reactive to light and accommodation. No scleral icterus. Extraocular muscles intact.  HEENT: Head atraumatic, normocephalic. Oropharynx and nasopharynx clear. No oropharyngeal erythema, moist oral mucosa  NECK:  Supple, no jugular venous distention. No thyroid enlargement, no tenderness.  LUNGS: Normal breath sounds bilaterally, no wheezing, rales, rhonchi. No use of accessory muscles of respiration.  CARDIOVASCULAR: S1, S2 normal. No murmurs, rubs, or gallops.  ABDOMEN: Soft, nontender, nondistended. Bowel sounds present. No organomegaly or mass.  EXTREMITIES: No pedal edema, cyanosis, or clubbing. + 2 pedal & radial pulses b/l.   NEUROLOGIC: Cranial nerves II through XII are intact. No focal Motor or sensory deficits appreciated b/l PSYCHIATRIC: The patient is alert and oriented x 3. Good affect.  SKIN: No obvious rash, lesion, or ulcer.   LABORATORY PANEL:   CBC  Recent Labs Lab 04/14/15 1350  WBC 6.6  HGB 13.8  HCT 41.6  PLT 159   ------------------------------------------------------------------------------------------------------------------  Chemistries   Recent Labs Lab 04/14/15 1350  NA 141  K 3.9  CL 111  CO2 24  GLUCOSE 126*  BUN 13  CREATININE 1.31*  CALCIUM 8.4*  AST 46*  ALT 31  ALKPHOS 105  BILITOT 1.2   ------------------------------------------------------------------------------------------------------------------  Cardiac Enzymes No results for input(s): TROPONINI in the last 168  hours. ------------------------------------------------------------------------------------------------------------------  RADIOLOGY:  Ct Abdomen Pelvis W Contrast  04/14/2015   CLINICAL DATA:  Lower abdominal pain.  EXAM: CT ABDOMEN AND PELVIS WITH CONTRAST  TECHNIQUE: Multidetector CT imaging of the abdomen and pelvis was performed using the standard protocol following bolus administration of intravenous contrast.  CONTRAST:  20m OMNIPAQUE IOHEXOL 300 MG/ML  SOLN  COMPARISON:  CT scan of November 19, 2012.  FINDINGS: Moderate degenerative disc disease is noted at L4-5. Mild bilateral pleural effusions are noted with left greater than right. Adjacent subsegmental atelectasis is noted.  Multiple gallstones are noted. Severe hepatic cirrhosis is noted  without evidence of focal mass. Spleen appears normal in size and appearance. The pancreas appears normal. Paraesophageal varices are noted as well as varices in the porta hepatis region. This is consistent with portal hypertension. Moderate ascites is noted. Atherosclerosis of abdominal aorta is noted without aneurysm formation. Adrenal glands appear normal. Stable bilateral renal cysts are noted. Moderate proximal small bowel dilatation is noted which which appears to be due to focal enteritis involving the jejunum. Uterus appears normal. The appendix appears normal. Urinary bladder appears normal. No significant adenopathy is noted.  IMPRESSION: Atherosclerosis of abdominal aorta is noted without aneurysm formation.  Mild bilateral pleural effusions are noted with adjacent sub segmental atelectasis, left greater than right.  Cholelithiasis is again noted.  Severe hepatic cirrhosis is noted with paraesophageal varices and varices in the porta hepatis region consistent with portal hypertension.  Moderate ascites is noted. Proximal small bowel dilatation is noted which appears to be due to focal enteritis of the jejunum with wall thickening.   Electronically  Signed   By: Marijo Conception, M.D.   On: 04/14/2015 16:50     IMPRESSION AND PLAN:   33 f with HTN, DM, Cirrhosis, Breast cancer here with abdominal pain  * Abdominal pain Likely from enteritis or SBP Start on ceftriaxone and Flagyl. Patient will be nothing by mouth.. Consult interventional radiology for paracentesis. Labs ordered. Also request GI to see the patient.  * UTI On IV abx. Await cx results.  * Cirrhosis with varices and ascitis Unknown etiology. Does not drink alcohol. Check hepatitis panel. Gi consulted for further workup. Will start low dose lasix and aldactone.  * DM SSI  * HTN Home medications.  * Breast cancer OP f/u with Dr. Bary Castilla. Noticed new lumps and got a mammogram. She has appt with him.  DVT prophylaxis with lovenox   All the records are reviewed and case discussed with ED provider. Management plans discussed with the patient, family and they are in agreement.  CODE STATUS: DNR/DNI Patient mentions that she is 81 years old. Has lost a daughter to cancer recently. She has been progressively getting weaker and worse with her health. She mentions that she wants to be treated and get stronger. But does not want to be resuscitated or put on any artificial means of life prolonging care.   TOTAL TIME TAKING CARE OF THIS PATIENT: 45 minutes.    Hillary Bow R M.D on 04/14/2015 at 7:51 PM  Between 7am to 6pm - Pager - [phone]  After 6pm go to [email or web address] - password [email or web address] [facility]  [place] [facility]  Office  [phone]  CC: Primary care physician; No primary care provider on file.

## 2015-04-14 NOTE — ED Notes (Signed)
Report called to Caren Griffins on 1C at 2010, pt still in ED due to pt only having access on left arm and being very hard stick to get ordered labs of Protime, Hepatitis B and  Hepatitis C.

## 2015-04-14 NOTE — ED Provider Notes (Signed)
  Physical Exam  BP 149/65 mmHg  Pulse 85  Temp(Src) 98.6 F (37 C) (Oral)  Resp 27  Ht 5\' 5"  (1.651 m)  Wt 185 lb (83.915 kg)  BMI 30.79 kg/m2  SpO2 95% ----------------------------------------- 6:53 PM on 04/14/2015 -----------------------------------------   Physical Exam Patient resting comfortably at this time but still saying with abdominal pain. Requesting water and able to tolerate water without vomiting. ED Course  Procedures CAT scan with atherosclerosis of the abdominal aorta. Mild bilateral pleural effusions. Cholelithiasis. Cirrhosis. Moderate ascites.  Patient given ceftriaxone for urinary tract infection. Also with moderate ascites there is a possibility of spontaneous bacterial peritonitis. Ceftriaxone should also cover this. We'll re-dose patient's pain medications and admitted to the hospital. Signed out to Dr. Darvin Neighbours.       Orbie Pyo, MD 04/14/15 [phone]

## 2015-04-14 NOTE — ED Notes (Signed)
MD at bedside. Family at bedside. NAD noted at this time.

## 2015-04-14 NOTE — Progress Notes (Signed)
Pt reports diarrhea for a couple of days. Arrived from the ED with incontinent episode of loose stool. MD notified. Telephone order read back given to collect stool for cdiff.Jeffie Pollock, RN

## 2015-04-15 ENCOUNTER — Inpatient Hospital Stay: Payer: Medicare HMO

## 2015-04-15 LAB — GLUCOSE, CAPILLARY
GLUCOSE-CAPILLARY: 94 mg/dL (ref 65–99)
GLUCOSE-CAPILLARY: 92 mg/dL (ref 65–99)
GLUCOSE-CAPILLARY: 93 mg/dL (ref 65–99)
Glucose-Capillary: 135 mg/dL — ABNORMAL HIGH (ref 65–99)

## 2015-04-15 LAB — COMPREHENSIVE METABOLIC PANEL
ALK PHOS: 75 U/L (ref 38–126)
ALK PHOS: 60 U/L (ref 38–126)
ALT: 27 U/L (ref 14–54)
ALT: 25 U/L (ref 14–54)
AST: 41 U/L (ref 15–41)
AST: 39 U/L (ref 15–41)
Albumin: 2 g/dL — ABNORMAL LOW (ref 3.5–5.0)
Albumin: 1.8 g/dL — ABNORMAL LOW (ref 3.5–5.0)
Anion gap: 7 (ref 5–15)
Anion gap: 8 (ref 5–15)
BILIRUBIN TOTAL: 1.4 mg/dL — AB (ref 0.3–1.2)
BUN: 18 mg/dL (ref 6–20)
BUN: 22 mg/dL — AB (ref 6–20)
CALCIUM: 8.1 mg/dL — AB (ref 8.9–10.3)
CHLORIDE: 113 mmol/L — AB (ref 101–111)
CO2: 22 mmol/L (ref 22–32)
CO2: 19 mmol/L — ABNORMAL LOW (ref 22–32)
CREATININE: 1.65 mg/dL — AB (ref 0.44–1.00)
Calcium: 8 mg/dL — ABNORMAL LOW (ref 8.9–10.3)
Chloride: 114 mmol/L — ABNORMAL HIGH (ref 101–111)
Creatinine, Ser: 1.67 mg/dL — ABNORMAL HIGH (ref 0.44–1.00)
GFR calc Af Amer: 32 mL/min — ABNORMAL LOW (ref >60)
GFR calc Af Amer: 33 mL/min — ABNORMAL LOW (ref >60)
GFR calc non Af Amer: 28 mL/min — ABNORMAL LOW (ref >60)
GFR, EST NON AFRICAN AMERICAN: 28 mL/min — AB (ref >60)
Glucose, Bld: 111 mg/dL — ABNORMAL HIGH (ref 65–99)
Glucose, Bld: 96 mg/dL (ref 65–99)
POTASSIUM: 4.6 mmol/L (ref 3.5–5.1)
Potassium: 4.4 mmol/L (ref 3.5–5.1)
Sodium: 142 mmol/L (ref 135–145)
Sodium: 141 mmol/L (ref 135–145)
TOTAL PROTEIN: 4.8 g/dL — AB (ref 6.5–8.1)
Total Bilirubin: 1.2 mg/dL (ref 0.3–1.2)
Total Protein: 5.3 g/dL — ABNORMAL LOW (ref 6.5–8.1)

## 2015-04-15 LAB — CBC
HCT: 34.7 % — ABNORMAL LOW (ref 35.0–47.0)
Hemoglobin: 11.3 g/dL — ABNORMAL LOW (ref 12.0–16.0)
MCH: 31.5 pg (ref 26.0–34.0)
MCHC: 32.7 g/dL (ref 32.0–36.0)
MCV: 96.5 fL (ref 80.0–100.0)
PLATELETS: 102 10*3/uL — AB (ref 150–440)
RBC: 3.59 MIL/uL — ABNORMAL LOW (ref 3.80–5.20)
RDW: 13.8 % (ref 11.5–14.5)
WBC: 13.2 10*3/uL — AB (ref 3.6–11.0)

## 2015-04-15 LAB — BODY FLUID CELL COUNT WITH DIFFERENTIAL
Eos, Fluid: 0 %
Lymphs, Fluid: 11 %
Monocyte-Macrophage-Serous Fluid: 1 %
NEUTROPHIL FLUID: 88 %
Other Cells, Fluid: 0 %
WBC FLUID: 12667 uL

## 2015-04-15 LAB — PROTEIN, BODY FLUID: Total protein, fluid: <3 g/dL

## 2015-04-15 LAB — GLUCOSE, SEROUS FLUID: Glucose, Fluid: 91 mg/dL

## 2015-04-15 LAB — FERRITIN: Ferritin: 105 ng/mL (ref 11–307)

## 2015-04-15 LAB — ALBUMIN, FLUID (OTHER): Albumin, Fluid: <1 g/dL

## 2015-04-15 LAB — C-REACTIVE PROTEIN: CRP: 14.2 mg/dL — AB (ref <1.0)

## 2015-04-15 LAB — SEDIMENTATION RATE: Sed Rate: 27 mm/hr (ref 0–30)

## 2015-04-15 MED ORDER — LOPERAMIDE HCL 2 MG PO CAPS: 2.0000 mg | ORAL_CAPSULE | Freq: Four times a day (QID) | ORAL | Status: DC | PRN

## 2015-04-15 MED ORDER — DEXTROSE 5 % IV SOLN
1.0000 g | INTRAVENOUS | Status: DC
  Administered 2015-04-16 – 2015-04-18 (×3): 1 g via INTRAVENOUS
  Filled 2015-04-15 (×4): qty 10

## 2015-04-15 MED ORDER — NADOLOL 20 MG PO TABS
20.0000 mg | ORAL_TABLET | Freq: Every day | ORAL | Status: DC
  Administered 2015-04-16 – 2015-04-18 (×3): 20 mg via ORAL
  Filled 2015-04-15 (×4): qty 1

## 2015-04-15 NOTE — Plan of Care (Signed)
Problem: Discharge Progression Outcomes Goal: Other Discharge Outcomes/Goals Outcome: Progressing No complaints of pain. 3.9L serous fluid removed from abdomen from paracentesis.  Increased to clear liquid diet. Will increase to full liquids if tolerated. Repositions self.

## 2015-04-15 NOTE — Progress Notes (Addendum)
[place] at [place] NAME: Becky Roberts    MR#:  [id number]  DATE OF BIRTH:  09-08-1932  SUBJECTIVE:  CHIEF COMPLAINT:   Chief Complaint  Patient presents with  . Nausea  . Emesis  . Diarrhea   patient is a 81 year old Caucasian female who presents to the hospital with complaints of nausea, vomiting and diarrhea. She was in afebrile in the emergency room Her labs revealed a mild renal insufficiency. CT scan of abdomen and pelvis, however, showed mild bilateral pleural effusions, cholelithiasis, severe hepatic cirrhosis with paraesophageal varices and varices and porta hepatis region consistent with portal hypertension, moderate ascites. Proximal small bowel was dilated concerning for focal enteritis. Patient was admitted to the hospital for further evaluation. Her stool cultures were negative for C. difficile. Patient still complains of diarrheal stool. She is to undergo paracentesis today.   Review of Systems  Constitutional: Negative for fever, chills and weight loss.  HENT: Negative for congestion.   Eyes: Negative for blurred vision and double vision.  Respiratory: Negative for cough, sputum production, shortness of breath and wheezing.   Cardiovascular: Negative for chest pain, palpitations, orthopnea, leg swelling and PND.  Gastrointestinal: Positive for nausea, vomiting, abdominal pain and diarrhea. Negative for constipation and blood in stool.  Genitourinary: Negative for dysuria, urgency, frequency and hematuria.  Musculoskeletal: Negative for falls.  Neurological: Negative for dizziness, tremors, focal weakness and headaches.  Endo/Heme/Allergies: Does not bruise/bleed easily.  Psychiatric/Behavioral: Negative for depression. The patient does not have insomnia.     VITAL SIGNS: Blood pressure 110/33, pulse 74, temperature 97.8 F (36.6 C), temperature source Oral, resp. rate 20, height 5\' 5"  (1.651 m), weight 97.659 kg  (215 lb 4.8 oz), SpO2 98 %.  PHYSICAL EXAMINATION:   GENERAL:  81 y.o.-year-old patient lying in the bed with no acute distress. Dry oral mucosa EYES: Pupils equal, round, reactive to light and accommodation. No scleral icterus. Extraocular muscles intact.  HEENT: Head atraumatic, normocephalic. Oropharynx and nasopharynx clear.  NECK:  Supple, no jugular venous distention. No thyroid enlargement, no tenderness.  LUNGS: Diminished breath sounds bilaterally at bases, no wheezing, rales,rhonchi or crepitations. No use of accessory muscles of respiration.  CARDIOVASCULAR: S1, S2 normal. No murmurs, rubs, or gallops.  ABDOMEN: Soft, , nondistended, fluid wave was noted on palpation. Bowel sounds present. No organomegaly or masses. Minimal discomfort on palpation diffusely, mostly in lower abdomen. No rebound or guarding were noted EXTREMITIES: 2+ calve  and pedal edema, no cyanosis, or clubbing.  NEUROLOGIC: Cranial nerves II through XII are intact. Muscle strength 5/5 in all extremities. Sensation intact. Gait not checked.  PSYCHIATRIC: The patient is alert and oriented x 3.  SKIN: No obvious rash, lesion, or ulcer.   ORDERS/RESULTS REVIEWED:   CBC  Recent Labs Lab 04/14/15 1350 04/15/15 0501  WBC 6.6 13.2*  HGB 13.8 11.3*  HCT 41.6 34.7*  PLT 159 102*  MCV 96.5 96.5  MCH 31.9 31.5  MCHC 33.1 32.7  RDW 14.0 13.8   ------------------------------------------------------------------------------------------------------------------  Chemistries   Recent Labs Lab 04/14/15 1350 04/15/15 0501  NA 141 142  K 3.9 4.4  CL 111 113*  CO2 24 22  GLUCOSE 126* 111*  BUN 13 18  CREATININE 1.31* 1.67*  CALCIUM 8.4* 8.1*  AST 46* 41  ALT 31 27  ALKPHOS 105 75  BILITOT 1.2 1.4*   ------------------------------------------------------------------------------------------------------------------ estimated creatinine clearance is 30.6 mL/min (by C-G formula based on Cr of  1.67). ------------------------------------------------------------------------------------------------------------------  No results for input(s): TSH, T4TOTAL, T3FREE, THYROIDAB in the last 72 hours.  Invalid input(s): FREET3  Cardiac Enzymes No results for input(s): CKMB, TROPONINI, MYOGLOBIN in the last 168 hours.  Invalid input(s): CK ------------------------------------------------------------------------------------------------------------------ Invalid input(s): POCBNP ---------------------------------------------------------------------------------------------------------------  RADIOLOGY: Ct Abdomen Pelvis W Contrast  04/14/2015   CLINICAL DATA:  Lower abdominal pain.  EXAM: CT ABDOMEN AND PELVIS WITH CONTRAST  TECHNIQUE: Multidetector CT imaging of the abdomen and pelvis was performed using the standard protocol following bolus administration of intravenous contrast.  CONTRAST:  5mL OMNIPAQUE IOHEXOL 300 MG/ML  SOLN  COMPARISON:  CT scan of November 19, 2012.  FINDINGS: Moderate degenerative disc disease is noted at L4-5. Mild bilateral pleural effusions are noted with left greater than right. Adjacent subsegmental atelectasis is noted.  Multiple gallstones are noted. Severe hepatic cirrhosis is noted without evidence of focal mass. Spleen appears normal in size and appearance. The pancreas appears normal. Paraesophageal varices are noted as well as varices in the porta hepatis region. This is consistent with portal hypertension. Moderate ascites is noted. Atherosclerosis of abdominal aorta is noted without aneurysm formation. Adrenal glands appear normal. Stable bilateral renal cysts are noted. Moderate proximal small bowel dilatation is noted which which appears to be due to focal enteritis involving the jejunum. Uterus appears normal. The appendix appears normal. Urinary bladder appears normal. No significant adenopathy is noted.  IMPRESSION: Atherosclerosis of abdominal aorta is noted  without aneurysm formation.  Mild bilateral pleural effusions are noted with adjacent sub segmental atelectasis, left greater than right.  Cholelithiasis is again noted.  Severe hepatic cirrhosis is noted with paraesophageal varices and varices in the porta hepatis region consistent with portal hypertension.  Moderate ascites is noted. Proximal small bowel dilatation is noted which appears to be due to focal enteritis of the jejunum with wall thickening.   Electronically Signed   By: Marijo Conception, M.D.   On: 04/14/2015 16:50    EKG:  Orders placed or performed in visit on 04/28/13  . EKG 12-Lead    ASSESSMENT AND PLAN:  Active Problems:   Abdominal pain   UTI (urinary tract infection)   Enteritis   Ascites  * SBP On IV abx 12,000 WBC in peritoneal fluid. 88% Neutrophils Await cx.  * ARF over CKD3 Start IVF I/Os.  * Diarrhea, unclear etiology, questionable related to focal enteritis,  liver cirrhosis, C. difficile negative. Will initiate patient on Imodium  * Urinary Tract infection. Get cultures, and continue antibiotic therapy  * Liver cirrhosis of unknown etiology Gi f/u after discharge  Management plans discussed with the patient, family and they are in agreement.   DRUG ALLERGIES:  Allergies  Allergen Reactions  . Sulfa Antibiotics Hives  . Vioxx [Rofecoxib] Hives and Itching  . Aspirin Palpitations    CODE STATUS:     Code Status Orders        Start     Ordered   04/14/15 1954  Do not attempt resuscitation (DNR)   Continuous    Question Answer Comment  In the event of cardiac or respiratory ARREST Do not call a "code blue"   In the event of cardiac or respiratory ARREST Do not perform Intubation, CPR, defibrillation or ACLS   In the event of cardiac or respiratory ARREST Use medication by any route, position, wound care, and other measures to relive pain and suffering. May use oxygen, suction and manual treatment of airway obstruction as needed for  comfort.  04/14/15 1954      TOTAL TIME TAKING CARE OF THIS PATIENT: 40  minutes.    Between 7am to 6pm - Pager - [phone]  After 6pm go to [email or web address] - password [email or web address] [facility]  [place] [facility]  Office  [phone]  CC: Primary care physician; No primary care provider on file.

## 2015-04-15 NOTE — Progress Notes (Signed)
Dr. Marcille Blanco responded to page.  No measurements taken for low BP at this time.  Will continue to monitor.

## 2015-04-15 NOTE — Procedures (Signed)
Under US guidance, paracentesis was performed. No immediate complication.

## 2015-04-15 NOTE — Consult Note (Signed)
Consult discussed with Claudie Leach.  Pt with cirrhosis and possible breast cancer.  Had paracentesis today.  Will check some labs.  Probably cirrhosis from diabetes and moderate obesity.  Will follow with you but no significant treatment exists for the cirrhosis.

## 2015-04-15 NOTE — Care Management Note (Signed)
Case Management Note  Patient Details  Name: Becky Roberts MRN: [id number] Date of Birth: 1931-10-14  Subjective/Objective: Admitted with  nausea, vomiting and diarrhea. Found to have ascites and enteritis secondary to Cirrhosis. Plan is paracentesis, lab workup. PMH: HTN, DM, Cirrhosis, depression and breast cancer (current?). Patient lives at home with her son Becky Roberts [phone], out of order), his girlfriend and grandson. She uses a walker but reports she has limited mobility. Family assist with adls and meals.  Son transports her to and from MD appointments. She reports last being seen May/June?  She is followed at [facility] clinic.Reports compliance with some of her medications, although it is noted in chart she was dismissed from her last PCP due to now shows.  Patient has PCS services 4 hours per day and home health although she is unable to recall the name of the agency. TC to [facility] and patient is followed by this agency for PT, OT.  Attempted to reach son without success. Left message for daughter Becky Roberts [phone]). Briefly discussed discharge plan with patient and she is adamant that she will not go to rehab because she has all the help she needs at home. Will follow progression.                  Action/Plan:   Expected Discharge Date:                  Expected Discharge Plan:  [place]  In-House Referral:     Discharge planning Services     Post Acute Care Choice:    Choice offered to:     DME Arranged:    DME Agency:     HH Arranged:    [place] Agency:     Status of Service:  In process, will continue to follow  Medicare Important Message Given:    Date Medicare IM Given:    Medicare IM give by:    Date Additional Medicare IM Given:    Additional Medicare Important Message give by:     If discussed at [place] of Stay Meetings, dates discussed:    Additional Comments:  Becky Mango, RN 04/15/2015, 8:53 AM

## 2015-04-15 NOTE — Progress Notes (Signed)
Pts BP 110/33 after 4 attempts.  110/40 manually.   MD notified.

## 2015-04-15 NOTE — H&P (Signed)
GI Inpatient Consult Note  Reason for Consult: Abdominal Pain / Cirrhosis   Attending Requesting Consult: Dr. Darvin Neighbours  History of Present Illness: Becky Roberts is a 81 y.o. female reports that she has been having abdominal pain, nausea, vomiting, and diarrhea.  She reports abdominal pain as diffuse, can hardly stand for someone to touch her stomach.  She reports this is been on and off for many years, approximately 8.   She reports it hurts worse after eating, she does her best to avoid meats and greasy foods because they hurt the worst.  She reports that recently even drinking some water will cause the abdominal pain.She reports that the nausea happens on a daily basis and that she will take anti emetics with some relief.  She does however report several episodes of vomiting over the past couple of days.  She reports the diarrhea has been going on for a while she can't remember when it started.  She denies seeing any blood in her stool. Last bowel movement was earlier today.  She endorses shortness of breath, reports she uses inhaler approximately every 4 hours.  She feels like she needs to have oxygen at home.  She endorses taking Prilosec on a daily basis and feels that her heartburn and acid reflux symptoms are under control.  She is being treated for COPD, diabetes, and cirrhosis of unknown etiology.  She reports breast cancer 3 years ago resulted with radiation.  She reports she feels the breast cancer is back has had recent mammograms and is waiting for biopsy.  She reports that she was supposed to have a cholecystectomy about a year ago.  Dr. Bary Castilla advised against did the she  Is not a good candidate for surgery.    Past Medical History:  Past Medical History  Diagnosis Date  . Blood transfusion reaction   . Diabetes mellitus   . Depression   . Hypertension   . Anemia 2012    hgb 5.5, admitted,  workup multifactorial,  b12 deficiency  . Arthritis   . Asthma   . Anemia  in chronic kidney disease(285.21)   . Portal hypertension February 2014    CT suggested a small nodular liver, multiple varices adjacent to the spleen and inferior mediastinum and paraesophageal region. No ascites.  . Breast cancer July 2,2013    T1c,N0,M0. ER + PR- , HER-2/neu not overexpressing..  . Cirrhosis   . Cholelithiases   . CKD (chronic kidney disease) stage 3, GFR 30-59 ml/min     Problem List: Patient Active Problem List   Diagnosis Date Noted  . Abdominal pain 04/14/2015  . UTI (urinary tract infection) 04/14/2015  . Enteritis 04/14/2015  . Ascites 04/14/2015  . Cirrhosis of liver not due to alcohol 02/14/2014  . Chronic pain syndrome 01/14/2014  . Diarrhea 01/14/2014  . Urinary frequency 01/14/2014  . Gingivitis, chronic 01/13/2014  . Noncompliance of patient with dietary regimen 09/07/2013  . History of breast cancer 08/17/2013  . Dental infection 05/02/2013  . Restless legs syndrome 01/21/2013  . Chronic diastolic congestive heart failure 12/03/2012  . Chronic cholecystitis with calculus 11/05/2012  . Obesity (BMI 30-39.9) 04/17/2012  . Type II or unspecified type diabetes mellitus without mention of complication, not stated as uncontrolled   . Depression   . Hypertension   . Arthritis   . COPD (chronic obstructive pulmonary disease)   . Breast cancer 03/31/2012  . Generalized muscle weakness 03/06/2012    Past Surgical History: Past Surgical History  Procedure Laterality Date  . Breast biopsy  July 2013    [name]: wide excision   . Colonoscopy  2012    Allergies: Allergies  Allergen Reactions  . Sulfa Antibiotics Hives  . Vioxx [Rofecoxib] Hives and Itching  . Aspirin Palpitations    Home Medications: Prescriptions prior to admission  Medication Sig Dispense Refill Last Dose  . albuterol (PROVENTIL HFA;VENTOLIN HFA) 108 (90 BASE) MCG/ACT inhaler Inhale 2 puffs into the lungs every 6 (six) hours as needed for wheezing. 1 Inhaler 11 04/14/2015 at  Unknown time  . Calcium Carbonate-Vitamin D (CALTRATE 600+D PO) Take 2 tablets by mouth daily.   04/14/2015 at Unknown time  . chlorhexidine (PERIDEX) 0.12 % solution Use as directed 15 mLs in the mouth or throat 2 (two) times daily. 120 mL 2 04/14/2015 at Unknown time  . cyanocobalamin (,VITAMIN B-12,) 1000 MCG/ML injection Inject 1 mL (1,000 mcg total) into the muscle every 30 (thirty) days. (Patient taking differently: Inject 1,000 mcg into the muscle every 30 (thirty) days. Pt uses on the 28th of every month.) 10 mL 12 03/29/2015 at unknown  . diazepam (VALIUM) 2 MG tablet Take 2 mg by mouth 3 (three) times daily as needed for anxiety.   Past Week at Unknown time  . exemestane (AROMASIN) 25 MG tablet Take 25 mg by mouth daily.    04/14/2015 at Unknown time  . HYDROcodone-acetaminophen (NORCO) 10-325 MG per tablet Take 1 tablet by mouth every 6 (six) hours as needed. (Patient taking differently: Take 1 tablet by mouth every 6 (six) hours as needed for moderate pain. ) 120 tablet 0 Past Month at Unknown time  . insulin aspart protamine- aspart (NOVOLOG MIX 70/30) (70-30) 100 UNIT/ML injection Inject 38-40 Units into the skin 2 (two) times daily with a meal. Pt uses 38 units with breakfast and 40 units with dinner.   04/14/2015 at Unknown time  . insulin detemir (LEVEMIR) 100 UNIT/ML injection 16 to 20 units daily (Patient taking differently: Inject 16-20 Units into the skin 3 (three) times daily as needed (for high blood sugar). Pt uses as needed per sliding scale.) 15 mL 12 Past Month at Unknown time  . losartan (COZAAR) 100 MG tablet Take 1 tablet (100 mg total) by mouth daily. 90 tablet 3 04/14/2015 at Unknown time  . metoCLOPramide (REGLAN) 10 MG tablet Take 10 mg by mouth 3 (three) times daily before meals.    04/14/2015 at Unknown time  . metoprolol tartrate (LOPRESSOR) 25 MG tablet TAKE ONE TABLET TWICE DAILY (Patient taking differently: Take 25 mg by mouth 2 (two) times daily. ) 180 tablet 3 04/14/2015  at 0830  . omeprazole (PRILOSEC) 20 MG capsule Take 20 mg by mouth daily.   04/14/2015 at Unknown time  . rOPINIRole (REQUIP) 0.5 MG tablet Take 1 mg by mouth at bedtime.   04/13/2015 at Unknown time  . simvastatin (ZOCOR) 40 MG tablet Take 40 mg by mouth at bedtime.   04/13/2015 at Unknown time  . Vitamin D, Ergocalciferol, (DRISDOL) 50000 UNITS CAPS capsule Take 50,000 Units by mouth every 7 (seven) days. Pt takes on Monday.   04/11/2015 at unknown  . cyanocobalamin (,VITAMIN B-12,) 1000 MCG/ML injection INJECT 1ML INTO THE MUSCLE EVERY 30 DAYS (Patient not taking: Reported on 04/14/2015) 1 mL 3   . diazepam (VALIUM) 5 MG tablet TAKE ONE (1) TABLET THREE (3) TIMES EACH DAY (Patient not taking: Reported on 04/14/2015) 60 tablet 0   . Insulin Syringe-Needle U-100 31G X  5/16" 1 ML MISC TEST BLOOD SUGARS TWO TIMES DAILY. (Patient not taking: Reported on 04/14/2015) 100 each 5 Taking  . ondansetron (ZOFRAN) 4 MG tablet Take 1 tablet (4 mg total) by mouth every 8 (eight) hours as needed. (Patient not taking: Reported on 04/14/2015) 90 tablet 3 Taking  . PROAIR HFA 108 (90 BASE) MCG/ACT inhaler INHALE 2 PUFFS INTO THE LUNGS EVERY 6 HOURS AS NEEDED FOR WHEEZING (Patient not taking: Reported on 04/14/2015) 8.5 g 1   . promethazine (PHENERGAN) 12.5 MG tablet Take 1 tablet (12.5 mg total) by mouth every 8 (eight) hours as needed for nausea or vomiting. (Patient not taking: Reported on 04/14/2015) 20 tablet 0 Taking  . SYRINGE-NEEDLE, DISP, 3 ML (B-D SYRINGE/NEEDLE 3CC/25GX5/8) 25G X 5/8" 3 ML MISC Use once a day (Patient not taking: Reported on 04/14/2015) 50 each 5 Taking  . Syringe/Needle, Disp, (SYRINGE LUER LOCK) 25G X 5/8" 3 ML MISC 1 application by Does not apply route every 30 (thirty) days. (Patient not taking: Reported on 04/14/2015) 50 each 0 Taking  . TDaP (BOOSTRIX) 5-2.5-18.5 LF-MCG/0.5 injection Inject 0.5 mLs into the muscle once. (Patient not taking: Reported on 04/14/2015) 0.5 mL 0 Taking   Home medication  reconciliation was completed with the patient.   Scheduled Inpatient Medications:   . [START ON 04/16/2015] cefTRIAXone (ROCEPHIN) IVPB 1 gram/50 mL D5W  1 g Intravenous Q24H  . enoxaparin (LOVENOX) injection  40 mg Subcutaneous Q24H  . furosemide  20 mg Oral Daily  . insulin aspart  0-9 Units Subcutaneous 6 times per day  . metronidazole  500 mg Intravenous Q8H  . spironolactone  25 mg Oral Daily    Continuous Inpatient Infusions:     PRN Inpatient Medications:  acetaminophen **OR** acetaminophen, albuterol, morphine injection, ondansetron **OR** ondansetron (ZOFRAN) IV, oxyCODONE  Family History: family history includes Arthritis in her father, mother, and other; Cancer in her other; Diabetes in her father and mother; Hypertension in her father and mother.  The patient's family history is negative for inflammatory bowel disorders, GI malignancy, or solid organ transplantation.  Social History:   reports that she has quit smoking. She has never used smokeless tobacco. She reports that she does not drink alcohol or use illicit drugs. The patient denies ETOH, tobacco, or drug use.   Review of Systems: Constitutional: Weight is stable.  Eyes: No changes in vision. ENT: No oral lesions, sore throat. Many missing// broken teeth GI: see HPI.  Heme/Lymph: No easy bruising.  CV: No chest pain.  GU: No hematuria.  Integumentary: No rashes.  Neuro: No headaches.  Psych: No depression/anxiety.  Endocrine: No heat/cold intolerance.  Allergic/Immunologic: No urticaria.  Resp: No cough, positive for SOB.  Musculoskeletal: No joint swelling.    Physical Examination: BP 110/33 mmHg  Pulse 74  Temp(Src) 97.8 F (36.6 C) (Oral)  Resp 20  Ht 5' 5"  (1.651 m)  Wt 97.659 kg (215 lb 4.8 oz)  BMI 35.83 kg/m2  SpO2 98% Gen: NAD, alert and oriented x 4, fair historian HEENT: PEERLA, EOMI, Neck: supple, no JVD or thyromegaly Chest: CTA bilaterally, no wheezes, crackles, or other  adventitious sounds CV: RRR, no m/g/c/r Abd: diffuse tenderness, distended, +BS in all four quadrants; no HSM, guarding, ridigity, or rebound tenderness Ext:  well perfused with 2+ pulses, Skin: no rash or lesions noted Lymph: no LAD  Data: Lab Results  Component Value Date   WBC 13.2* 04/15/2015   HGB 11.3* 04/15/2015   HCT 34.7* 04/15/2015  MCV 96.5 04/15/2015   PLT 102* 04/15/2015    Recent Labs Lab 04/14/15 1350 04/15/15 0501  HGB 13.8 11.3*   Lab Results  Component Value Date   NA 142 04/15/2015   K 4.4 04/15/2015   CL 113* 04/15/2015   CO2 22 04/15/2015   BUN 18 04/15/2015   CREATININE 1.67* 04/15/2015   Lab Results  Component Value Date   ALT 27 04/15/2015   AST 41 04/15/2015   ALKPHOS 75 04/15/2015   BILITOT 1.4* 04/15/2015    Recent Labs Lab 04/14/15 2050  INR 1.52  Imaging DATA: Lower abdominal pain.  EXAM: CT ABDOMEN AND PELVIS WITH CONTRAST  TECHNIQUE: Multidetector CT imaging of the abdomen and pelvis was performed using the standard protocol following bolus administration of intravenous contrast.  CONTRAST: 30m OMNIPAQUE IOHEXOL 300 MG/ML SOLN  COMPARISON: CT scan of November 19, 2012.  FINDINGS: Moderate degenerative disc disease is noted at L4-5. Mild bilateral pleural effusions are noted with left greater than right. Adjacent subsegmental atelectasis is noted.  Multiple gallstones are noted. Severe hepatic cirrhosis is noted without evidence of focal mass. Spleen appears normal in size and appearance. The pancreas appears normal. Paraesophageal varices are noted as well as varices in the porta hepatis region. This is consistent with portal hypertension. Moderate ascites is noted. Atherosclerosis of abdominal aorta is noted without aneurysm formation. Adrenal glands appear normal. Stable bilateral renal cysts are noted. Moderate proximal small bowel dilatation is noted which which appears to be due to focal enteritis  involving the jejunum. Uterus appears normal. The appendix appears normal. Urinary bladder appears normal. No significant adenopathy is noted.  IMPRESSION: Atherosclerosis of abdominal aorta is noted without aneurysm formation.  Mild bilateral pleural effusions are noted with adjacent sub segmental atelectasis, left greater than right.  Cholelithiasis is again noted.  Severe hepatic cirrhosis is noted with paraesophageal varices and varices in the porta hepatis region consistent with portal hypertension.  Moderate ascites is noted. Proximal small bowel dilatation is noted which appears to be due to focal enteritis of the jejunum with wall thickening.   Electronically Signed  By: JMarijo Conception M.D.  On: 04/14/2015 16:50  Assessment/Plan: Ms. MLippmanis a 81y.o. female with abdominal pain and cirrhosis  Recommendations: We do not believe she is a candidate for endoscopy at this time.  We agree with ordering an A1A.  We recommend ordering a hepatitis panel to check her for Hep A, B and C.  We recommend checking her ferritin, SED rate, CRP as well as Alpha-Feto protein markers.  We will continue to follow with you. Thank you for the consult. Please call with questions or concerns.  CSalvadore Farber PA-C  I personally performed these services.

## 2015-04-15 NOTE — Plan of Care (Signed)
Problem: Discharge Progression Outcomes Goal: Discharge plan in place and appropriate Individualism Outcome: Progressing Pt prefers to be called Becky Roberts. She lives at home with her son and his girlfriend. She recently had a daughter (age 79) die from cancer. Is chairfast, but can stand and pivot with help. Uses a wheelchair at home. Goal: Other Discharge Outcomes/Goals Outcome: Progressing Plan of care progress to goal: Pain - pt complains of pain, but does not request medication, sleeps peacefully Hemodynamically stable - vitals stable this shift Complications - pt being treated with antibiotics  Diet - pt NPO Activity - pt chairfast

## 2015-04-16 LAB — GLUCOSE, CAPILLARY
GLUCOSE-CAPILLARY: 103 mg/dL — AB (ref 65–99)
GLUCOSE-CAPILLARY: 106 mg/dL — AB (ref 65–99)
GLUCOSE-CAPILLARY: 129 mg/dL — AB (ref 65–99)
GLUCOSE-CAPILLARY: 163 mg/dL — AB (ref 65–99)
GLUCOSE-CAPILLARY: 97 mg/dL (ref 65–99)
Glucose-Capillary: 111 mg/dL — ABNORMAL HIGH (ref 65–99)
Glucose-Capillary: 136 mg/dL — ABNORMAL HIGH (ref 65–99)

## 2015-04-16 LAB — CREATININE, SERUM
Creatinine, Ser: 1.96 mg/dL — ABNORMAL HIGH (ref 0.44–1.00)
GFR calc Af Amer: 26 mL/min — ABNORMAL LOW (ref >60)
GFR, EST NON AFRICAN AMERICAN: 23 mL/min — AB (ref >60)

## 2015-04-16 LAB — URINE CULTURE

## 2015-04-16 LAB — HEPATITIS B SURFACE ANTIBODY, QUANTITATIVE: Hepatitis B-Post: <3.1 m[IU]/mL — ABNORMAL LOW (ref >9.9)

## 2015-04-16 LAB — HEPATITIS PANEL, ACUTE
HCV Ab: 0.1 s/co ratio (ref 0.0–0.9)
Hep A IgM: NEGATIVE
Hep B C IgM: NEGATIVE
Hepatitis B Surface Ag: NEGATIVE

## 2015-04-16 LAB — HEPATITIS B SURFACE ANTIGEN: Hepatitis B Surface Ag: NEGATIVE

## 2015-04-16 LAB — CBC
HCT: 28 % — ABNORMAL LOW (ref 35.0–47.0)
Hemoglobin: 9.2 g/dL — ABNORMAL LOW (ref 12.0–16.0)
MCH: 31.9 pg (ref 26.0–34.0)
MCHC: 33 g/dL (ref 32.0–36.0)
MCV: 96.6 fL (ref 80.0–100.0)
PLATELETS: 70 10*3/uL — AB (ref 150–440)
RBC: 2.9 MIL/uL — AB (ref 3.80–5.20)
RDW: 14 % (ref 11.5–14.5)
WBC: 8.4 10*3/uL (ref 3.6–11.0)

## 2015-04-16 LAB — HEPATITIS C ANTIBODY: HCV Ab: <0.1 s/co ratio (ref 0.0–0.9)

## 2015-04-16 LAB — AFP TUMOR MARKER: AFP TUMOR MARKER: 2.1 ng/mL (ref 0.0–8.3)

## 2015-04-16 LAB — HEPATITIS B CORE ANTIBODY, TOTAL: Hep B Core Total Ab: NEGATIVE

## 2015-04-16 MED ORDER — SODIUM CHLORIDE 0.9 % IV SOLN
INTRAVENOUS | Status: DC
Start: 2015-04-16 — End: 2015-04-17
  Administered 2015-04-16: 18:00:00 via INTRAVENOUS

## 2015-04-16 MED ORDER — ENOXAPARIN SODIUM 30 MG/0.3ML ~~LOC~~ SOLN
30.0000 mg | SUBCUTANEOUS | Status: DC
  Administered 2015-04-16 – 2015-04-17 (×2): 30 mg via SUBCUTANEOUS
  Filled 2015-04-16 (×2): qty 0.3

## 2015-04-16 NOTE — Plan of Care (Signed)
Problem: Discharge Progression Outcomes Goal: Other Discharge Outcomes/Goals Plan of Care Progress to Goal:   Pt BP and HR remains low. Pt report pain once during shift. Pt advance to soft diet. No other signs of distress noted.

## 2015-04-16 NOTE — Consult Note (Signed)
GI Inpatient Follow-up Note  Patient Identification: Becky Roberts is a 81 y.o. female reports that her pain is improving.  She ate grits and applesauce for breakfast and tolerated fairly well.  She reports some abdominal pain shortly after eating, but has since resolved.  Reports her overall pain as a 2/10.  She denies vomiting since admission, has not had a bowel movement.  She had paracentesis yesterday, 3.9 liters of serous fluid was removed.  Subjective:  Scheduled Inpatient Medications:  . cefTRIAXone (ROCEPHIN) IVPB 1 gram/50 mL D5W  1 g Intravenous Q24H  . enoxaparin (LOVENOX) injection  40 mg Subcutaneous Q24H  . insulin aspart  0-9 Units Subcutaneous 6 times per day  . metronidazole  500 mg Intravenous Q8H  . nadolol  20 mg Oral Daily  . spironolactone  25 mg Oral Daily    Continuous Inpatient Infusions:     PRN Inpatient Medications:  acetaminophen **OR** acetaminophen, albuterol, loperamide, morphine injection, ondansetron **OR** ondansetron (ZOFRAN) IV, oxyCODONE  Review of Systems: Constitutional: Weight is stable.  Eyes: No changes in vision. ENT: No oral lesions, sore throat.  GI: see HPI.  Heme/Lymph: No easy bruising.  CV: No chest pain.  GU: No hematuria.  Integumentary: No rashes.  Neuro: No headaches.  Psych: No depression/anxiety.  Endocrine: No heat/cold intolerance.  Allergic/Immunologic: No urticaria.  Resp: No cough, SOB.  Musculoskeletal: No joint swelling.    Physical Examination: BP 108/64 mmHg  Pulse 86  Temp(Src) 97.6 F (36.4 C) (Oral)  Resp 20  Ht 5\' 5"  (1.651 m)  Wt 91.808 kg (202 lb 6.4 oz)  BMI 33.68 kg/m2  SpO2 96% Gen: NAD, alert and oriented x 4 HEENT: PEERLA, EOMI, Neck: supple, no JVD or thyromegaly Chest: CTA bilaterally, no wheezes, crackles, or other adventitious sounds CV: RRR, no m/g/c/r Abd: soft, diffuse tenderness, ND, +BS in all four quadrants; no HSM, guarding, ridigity, or rebound tenderness Ext: no  edema, well perfused with 2+ pulses, Skin: no rash or lesions noted Lymph: no LAD  Data: Lab Results  Component Value Date   WBC 8.4 04/16/2015   HGB 9.2* 04/16/2015   HCT 28.0* 04/16/2015   MCV 96.6 04/16/2015   PLT 70* 04/16/2015    Recent Labs Lab 04/14/15 1350 04/15/15 0501 04/16/15 0426  HGB 13.8 11.3* 9.2*   Lab Results  Component Value Date   NA 141 04/15/2015   K 4.6 04/15/2015   CL 114* 04/15/2015   CO2 19* 04/15/2015   BUN 22* 04/15/2015   CREATININE 1.96* 04/16/2015   Lab Results  Component Value Date   ALT 25 04/15/2015   AST 39 04/15/2015   ALKPHOS 60 04/15/2015   BILITOT 1.2 04/15/2015    Recent Labs Lab 04/14/15 2050  INR 1.52   Imaging: CLINICAL DATA: Ascites.  EXAM: ULTRASOUND GUIDED PARACENTESIS  COMPARISON: None.  PROCEDURE: An ultrasound guided paracentesis was thoroughly discussed with the patient and questions answered. The benefits, risks, alternatives and complications were also discussed. The patient understands and wishes to proceed with the procedure. Written consent was obtained.  Ultrasound was performed to localize and mark an adequate pocket of fluid in the left lower quadrant of the abdomen. The area was then prepped and draped in the normal sterile fashion. 1% Lidocaine was used for local anesthesia. Under ultrasound guidance a Safe-T-Centesis catheter was introduced. Paracentesis was performed. The catheter was removed and a dressing applied.  COMPLICATIONS: None immediate.  FINDINGS: A total of approximately 3.9 L of serous fluid  was removed. A fluid sample was sent for laboratory analysis.  IMPRESSION: Successful ultrasound guided paracentesis yielding 3.9 L of ascites.   Electronically Signed  By: Marijo Conception, M.D.  On: 04/15/2015 16:20  Assessment/Plan: [name] is a 81 y.o. female with abdominal p[ain and cirrhosis  Recommendations: Her abdominal pain seems to be slowly  resolving.  She had 3.9 L of serous fluid removed during her paracentesis yesterday.  It did show 12,667 white blood cells.  Patient was on antibiotics before the tap, and may cause cultures not to grow.  Believe she is consistent with infectious causes.  Recommend continuing antibiotics for total of 7-10 days either IV or oral. We will continue to follow with you. Please call with questions or concerns.  Salvadore Farber, PA-C  I personally performed these services.

## 2015-04-17 LAB — GLUCOSE, CAPILLARY
GLUCOSE-CAPILLARY: 118 mg/dL — AB (ref 65–99)
GLUCOSE-CAPILLARY: 125 mg/dL — AB (ref 65–99)
GLUCOSE-CAPILLARY: 103 mg/dL — AB (ref 65–99)
Glucose-Capillary: 82 mg/dL (ref 65–99)

## 2015-04-17 LAB — BASIC METABOLIC PANEL
Anion gap: 3 — ABNORMAL LOW (ref 5–15)
BUN: 33 mg/dL — AB (ref 6–20)
CO2: 24 mmol/L (ref 22–32)
CREATININE: 1.95 mg/dL — AB (ref 0.44–1.00)
Calcium: 7.5 mg/dL — ABNORMAL LOW (ref 8.9–10.3)
Chloride: 112 mmol/L — ABNORMAL HIGH (ref 101–111)
GFR calc Af Amer: 27 mL/min — ABNORMAL LOW (ref >60)
GFR calc non Af Amer: 23 mL/min — ABNORMAL LOW (ref >60)
Glucose, Bld: 99 mg/dL (ref 65–99)
Potassium: 3.9 mmol/L (ref 3.5–5.1)
Sodium: 139 mmol/L (ref 135–145)

## 2015-04-17 LAB — CBC
HCT: 30.1 % — ABNORMAL LOW (ref 35.0–47.0)
Hemoglobin: 10 g/dL — ABNORMAL LOW (ref 12.0–16.0)
MCH: 32.1 pg (ref 26.0–34.0)
MCHC: 33.3 g/dL (ref 32.0–36.0)
MCV: 96.5 fL (ref 80.0–100.0)
PLATELETS: 113 10*3/uL — AB (ref 150–440)
RBC: 3.12 MIL/uL — ABNORMAL LOW (ref 3.80–5.20)
RDW: 14.7 % — ABNORMAL HIGH (ref 11.5–14.5)
WBC: 8.8 10*3/uL (ref 3.6–11.0)

## 2015-04-17 MED ORDER — ALPRAZOLAM 0.25 MG PO TABS
0.2500 mg | ORAL_TABLET | Freq: Two times a day (BID) | ORAL | Status: DC | PRN
  Administered 2015-04-17 – 2015-04-18 (×2): 0.25 mg via ORAL
  Filled 2015-04-17 (×2): qty 1

## 2015-04-17 NOTE — Plan of Care (Signed)
Problem: Acute Rehab PT Goals(only PT should resolve) Goal: Patient Will Perform Sitting Balance Pt will demonstrate ability to perform 8-10 minutes of seated balance practice at supervision to improve safety in home at DC.  Goal: Patient Will Transfer Sit To/From Stand Pt will transfer sit to/from-stand with RW at Sup without loss-of-balance to demonstrate good safety awareness for independent transfers in home.     Goal: Pt Will Ambulate Pt will ambulate with RW at [facility] using a step-through pattern and equal step length for a distances greater than 44ft to demonstrate the ability to decrease caregiver burden and improve quality of life.

## 2015-04-17 NOTE — Progress Notes (Signed)
[facility] [place] NAME: Becky Roberts    MR#:  [id number]  DATE OF BIRTH:  11-21-1931  SUBJECTIVE:  CHIEF COMPLAINT:   Chief Complaint  Patient presents with  . Nausea  . Emesis  . Diarrhea   Admitted for abd pain, diarrhea.  ROS  VITAL SIGNS: Blood pressure 107/46, pulse 66, temperature 97.8 F (36.6 C), temperature source Oral, resp. rate 18, height 5\' 5"  (1.651 m), weight 95.391 kg (210 lb 4.8 oz), SpO2 97 %.  PHYSICAL EXAMINATION:   GENERAL:  81 y.o.-year-old patient lying in the bed with no acute distress. Dry oral mucosa EYES: Pupils equal, round, reactive to light and accommodation. No scleral icterus. Extraocular muscles intact.  HEENT: Head atraumatic, normocephalic. Oropharynx and nasopharynx clear.  NECK:  Supple, no jugular venous distention. No thyroid enlargement, no tenderness.  LUNGS: Diminished breath sounds bilaterally at bases, no wheezing, rales,rhonchi or crepitations. No use of accessory muscles of respiration.  CARDIOVASCULAR: S1, S2 normal. No murmurs, rubs, or gallops.  ABDOMEN: Soft, , nondistended, fluid wave was noted on palpation. Bowel sounds present. No organomegaly or masses. Diffuse mild tenderness. EXTREMITIES: 2+ calve  and pedal edema, no cyanosis, or clubbing.  NEUROLOGIC: Cranial nerves II through XII are intact. Muscle strength 5/5 in all extremities. Sensation intact. Gait not checked.  PSYCHIATRIC: The patient is alert and oriented x 3.  SKIN: No obvious rash, lesion, or ulcer.   ORDERS/RESULTS REVIEWED:   CBC  Recent Labs Lab 04/14/15 1350 04/15/15 0501 04/16/15 0426 04/17/15 0432  WBC 6.6 13.2* 8.4 8.8  HGB 13.8 11.3* 9.2* 10.0*  HCT 41.6 34.7* 28.0* 30.1*  PLT 159 102* 70* 113*  MCV 96.5 96.5 96.6 96.5  MCH 31.9 31.5 31.9 32.1  MCHC 33.1 32.7 33.0 33.3  RDW 14.0 13.8 14.0 14.7*    ------------------------------------------------------------------------------------------------------------------  Chemistries   Recent Labs Lab 04/14/15 1350 04/15/15 0501 04/15/15 1259 04/16/15 0426 04/17/15 0432  NA 141 142 141  --  139  K 3.9 4.4 4.6  --  3.9  CL 111 113* 114*  --  112*  CO2 24 22 19*  --  24  GLUCOSE 126* 111* 96  --  99  BUN 13 18 22*  --  33*  CREATININE 1.31* 1.67* 1.65* 1.96* 1.95*  CALCIUM 8.4* 8.1* 8.0*  --  7.5*  AST 46* 41 39  --   --   ALT 31 27 25   --   --   ALKPHOS 105 75 60  --   --   BILITOT 1.2 1.4* 1.2  --   --    ------------------------------------------------------------------------------------------------------------------ estimated creatinine clearance is 25.9 mL/min (by C-G formula based on Cr of 1.95). ------------------------------------------------------------------------------------------------------------------ No results for input(s): TSH, T4TOTAL, T3FREE, THYROIDAB in the last 72 hours.  Invalid input(s): FREET3  Cardiac Enzymes No results for input(s): CKMB, TROPONINI, MYOGLOBIN in the last 168 hours.  Invalid input(s): CK ------------------------------------------------------------------------------------------------------------------ Invalid input(s): POCBNP ---------------------------------------------------------------------------------------------------------------  RADIOLOGY: US Venous Img Lower Bilateral  04/15/2015   EXAM: BILATERAL LOWER EXTREMITY VENOUS DOPPLER ULTRASOUND  TECHNIQUE: Gray-scale sonography with graded compression, as well as color Doppler and duplex ultrasound were performed to evaluate the lower extremity deep venous systems from the level of the common femoral vein and including the common femoral, femoral, profunda femoral, popliteal and calf veins including the posterior tibial, peroneal and gastrocnemius veins when visible. The superficial great saphenous vein was also interrogated. Spectral  Doppler was utilized to evaluate flow  at rest and with distal augmentation maneuvers in the common femoral, femoral and popliteal veins.  COMPARISON:  None.  FINDINGS: RIGHT LOWER EXTREMITY  Common Femoral Vein: No evidence of thrombus. Normal compressibility, respiratory phasicity and response to augmentation.  Saphenofemoral Junction: No evidence of thrombus. Normal compressibility and flow on color Doppler imaging.  Profunda Femoral Vein: No evidence of thrombus. Normal compressibility and flow on color Doppler imaging.  Femoral Vein: No evidence of thrombus. Normal compressibility, respiratory phasicity and response to augmentation.  Popliteal Vein: No evidence of thrombus. Normal compressibility, respiratory phasicity and response to augmentation.  Calf Veins: Calf veins not well visualized due to body habitus. No evidence of thrombus.  Superficial Great Saphenous Vein: No evidence of thrombus. Normal compressibility and flow on color Doppler imaging.  Venous Reflux:  None.  Other Findings:  None.  LEFT LOWER EXTREMITY  Common Femoral Vein: No evidence of thrombus. Normal compressibility, respiratory phasicity and response to augmentation.  Saphenofemoral Junction: No evidence of thrombus. Normal compressibility and flow on color Doppler imaging.  Profunda Femoral Vein: No evidence of thrombus. Normal compressibility and flow on color Doppler imaging.  Femoral Vein: No evidence of thrombus. Normal compressibility, respiratory phasicity and response to augmentation.  Popliteal Vein: No evidence of thrombus. Normal compressibility, respiratory phasicity and response to augmentation.  Calf Veins: Calf veins not well visualized due to body habitus. No evidence of thrombus.  Superficial Great Saphenous Vein: No evidence of thrombus. Normal compressibility and flow on color Doppler imaging.  Venous Reflux:  None.  Other Findings:  None.  IMPRESSION: No evidence of deep venous thrombosis.   Electronically Signed   By:  Marcello Moores  Register   On: 04/15/2015 15:03   US Paracentesis  04/15/2015   CLINICAL DATA:  Ascites.  EXAM: ULTRASOUND GUIDED PARACENTESIS  COMPARISON:  None.  PROCEDURE: An ultrasound guided paracentesis was thoroughly discussed with the patient and questions answered. The benefits, risks, alternatives and complications were also discussed. The patient understands and wishes to proceed with the procedure. Written consent was obtained.  Ultrasound was performed to localize and mark an adequate pocket of fluid in the left lower quadrant of the abdomen. The area was then prepped and draped in the normal sterile fashion. 1% Lidocaine was used for local anesthesia. Under ultrasound guidance a Safe-T-Centesis catheter was introduced. Paracentesis was performed. The catheter was removed and a dressing applied.  COMPLICATIONS: None immediate.  FINDINGS: A total of approximately 3.9 L of serous fluid was removed. A fluid sample was sent for laboratory analysis.  IMPRESSION: Successful ultrasound guided paracentesis yielding 3.9 L of ascites.   Electronically Signed   By: Marijo Conception, M.D.   On: 04/15/2015 16:20    EKG:  Orders placed or performed in visit on 04/28/13  . EKG 12-Lead    ASSESSMENT AND PLAN:  Active Problems:   Abdominal pain   UTI (urinary tract infection)   Enteritis   Ascites  * SBP On IV abx 12,000 WBC in peritoneal fluid. 88% Neutrophils Await cx results.  * ARF over CKD3 - likely due to large volume paracentesis Started IVF. Stop today Cr stable. I/Os.  * Enteritis On IV abx. Improving  * Diarrhea, unclear etiology, questionable related to focal enteritis,  liver cirrhosis, C. difficile negative.  * Urinary Tract infection.  On IV abx.  * Liver cirrhosis of unknown etiology GI f/u after discharge  * Anxiety Started on Xanax BID PRN  Management plans discussed with the patient, family and  they are in agreement.   DRUG ALLERGIES:  Allergies  Allergen  Reactions  . Sulfa Antibiotics Hives  . Vioxx [Rofecoxib] Hives and Itching  . Aspirin Palpitations    CODE STATUS:     Code Status Orders        Start     Ordered   04/14/15 1954  Do not attempt resuscitation (DNR)   Continuous    Question Answer Comment  In the event of cardiac or respiratory ARREST Do not call a "code blue"   In the event of cardiac or respiratory ARREST Do not perform Intubation, CPR, defibrillation or ACLS   In the event of cardiac or respiratory ARREST Use medication by any route, position, wound care, and other measures to relive pain and suffering. May use oxygen, suction and manual treatment of airway obstruction as needed for comfort.      04/14/15 1954      TOTAL TIME TAKING CARE OF THIS PATIENT: 40  minutes.    Between 7am to 6pm - Pager - [phone]  After 6pm go to [email or web address] - password [email or web address] [facility]  [facility]  Office  [phone]  CC: Primary care physician; No primary care provider on file.

## 2015-04-17 NOTE — Evaluation (Signed)
Physical Therapy Evaluation Patient Details Name: Becky Roberts MRN: [id number] DOB: [birth year] Today's Date: 04/17/2015   History of Present Illness  Becky Roberts is a 81yo white female. CC: abdominal pain, N/V/D.  Reports chronic abdominal pain, diffuse, painful to touch, ISQ ~8years. Worse p eating. Avoids meats and greasy which aggravate further.  Recently also worse c water. C/o nausea daily, taking antiemetics c some relief.  Report several episodes of vomiting over the past couple of days.  Reports diarrhea has been going on for a while she can't say when. Denies blood in stool. She endorses SOB, uses inhaler approximately Q4h.  She feels like she needs to have oxygen at home. She endorses taking Prilosec QD and feels that her heartburn and acid reflux symptoms are well managed. She is being treated for COPD, DM2, and cirrhosis of unknown etiology.  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  She feels the BrCA is back has had recent mammograms and is waiting for biopsy. She reports that she was supposed to have a cholecystectomy about a year ago. Dr. Bary Roberts advised against did the she is not a good candidate for surgery.. Pt describes several medical issues in last few months that have progressively made her less and less mobile. Reports it has been several montht sicne she has done much walking.   Clinical Impression  Pt is received semirecumbent in bed upon entry, awake, lethargic, and willing to participate. Family joined at bedside prior to conclusion. No acute distress noted, pt reports feeling much better absent recent N/V. Pt is A&Ox3 and pleasant and a vociferous historian. Pt strength as screened by functional mobility assessment presents c significant weakness requiring max assist for bed mobility and min to modA for transfers, son reporting pt to be at ~75% of baseline.Family has been providing 24/7 care for pt as she has been making progress c HHPT and would likely benefit  from return to home, as well as additional DME including hospital bed and standard WC. Patient presenting with impairment of strength, balance, pain, and activity tolerance, limiting ability to perform ADL and mobility tasks at  baseline level of function. Patient will benefit from skilled intervention to address the above impairments and limitations, in order to restore to prior level of function, improve patient safety upon discharge, to decrease caregiver burden and to decrease falls risk.       Follow Up Recommendations Home health PT (Continue c HHPT services beign received PTA. )    Equipment Recommendations  Wheelchair (measurements PT);Hospital bed    Recommendations for Other Services       Precautions / Restrictions Precautions Precautions: Fall Restrictions Weight Bearing Restrictions: No      Mobility  Bed Mobility Overal bed mobility: +2 for physical assistance;Needs Assistance Bed Mobility: Sit to Supine;Supine to Sit     Supine to sit: Total assist Sit to supine: Max assist   General bed mobility comments: Very weak and unable to use arms to help much. Son reports she is about 75% of baseline.   Transfers Overall transfer level: Needs assistance Equipment used: 1 person hand held assist Transfers: Sit to/from Stand Sit to Stand: Min assist         General transfer comment: Pt reports 'swimmy headed' but is able to come to standing c min assist.   Ambulation/Gait             General Gait Details: deferred at this time due to reports of dizziness and weakenss upon standing;  will reevaluate at subsequent visit.   Stairs            Wheelchair Mobility    Modified Rankin (Stroke Patients Only)       Balance Overall balance assessment: No apparent balance deficits (not formally assessed);Needs assistance Sitting-balance support: No upper extremity supported;Feet supported Sitting balance-Leahy Scale: Fair Sitting balance - Comments: Poor  excusion for reaching.        Standing balance comment: Not assessed at this time due to dizziness/ weakenss.                              Pertinent Vitals/Pain Pain Assessment: No/denies pain    Home Living Family/patient expects to be discharged to:: Private residence Living Arrangements: Children;Other relatives Available Help at Discharge: Family Type of Home: Mobile home Home Access: Stairs to enter;Ramped entrance (New home since admission. Son reports they are building a ramp today. )   Entrance Stairs-Number of Steps: unable to say Home Layout: One level Home Equipment: Walker - 2 wheels;Transport chair;Shower seat;Bedside commode Additional Comments: Family are primary caregivers, report pt would benefit from a standard WC, as well as a hospital bed. Pt has been sleeping on couch, and family has been carrying her in/out of home, and using a second-hand transport chair that is missing foot rests bilat.     Prior Function Level of Independence: Needs assistance   Gait / Transfers Assistance Needed: Very limited ambulation for last month due to frequent hospitalizations. Pt recently working c HHPT and making good progress, able to make very short [place] distances (40-60 feet)   ADL's / Homemaking Assistance Needed: Assistance from Becky Roberts, Becky Roberts, and daughter-in-law.         Hand Dominance        Extremity/Trunk Assessment   Upper Extremity Assessment: Generalized weakness (screened c MMT. )           Lower Extremity Assessment: Generalized weakness (Total assist for bed mobility. )      Cervical / Trunk Assessment: Kyphotic  Communication   Communication: No difficulties  Cognition Arousal/Alertness: Awake/alert;Lethargic Behavior During Therapy: WFL for tasks assessed/performed Overall Cognitive Status: Within Functional Limits for tasks assessed                      General Comments      Exercises        Assessment/Plan    PT  Assessment    PT Diagnosis Difficulty walking;Generalized weakness   PT Problem List    PT Treatment Interventions     PT Goals (Current goals can be found in the Care Plan section) Acute Rehab PT Goals Patient Stated Goal: Return to home and contnue making progress c PT.  PT Goal Formulation: With patient/family Time For Goal Achievement: 05/01/15 Potential to Achieve Goals: Fair    Frequency     Barriers to discharge        Co-evaluation               End of Session Equipment Utilized During Treatment: Gait belt Activity Tolerance: Patient limited by fatigue;Patient limited by lethargy;Treatment limited secondary to medical complications (Comment) Patient left: in bed;with bed alarm set;with call bell/phone within reach;with family/visitor present Nurse Communication: Mobility status         Time: 2297-9892 PT Time Calculation (min) (ACUTE ONLY): 15 min   Charges:   PT Evaluation $Initial PT Evaluation Tier I: 1 Procedure  PT G Codes:        Krisann Mckenna C 2015/04/26, 4:31 PM 4:36 PM  Etta Grandchild, PT, DPT [place] License # [id number]

## 2015-04-17 NOTE — Plan of Care (Signed)
Problem: Discharge Progression Outcomes Goal: Other Discharge Outcomes/Goals Outcome: Progressing Plan of Care Progress to Goal:   Pt diastolic remains low. Pt report abdominal pain during shift but pt was asleep on return to room with pain medication. Pt has been awake most of the shift. No other signs of distress noted. Will continue to monitor.

## 2015-04-17 NOTE — Plan of Care (Signed)
Problem: Discharge Progression Outcomes Goal: Discharge plan in place and appropriate Individualism Outcome: Progressing Possible homecare.  Possible need for hospital bed at home Goal: Pain controlled with appropriate interventions Outcome: Not Progressing Cont to c/o abd pain. Prn x1 for abd pain and x1 for nausea with improvement Goal: Hemodynamically stable Outcome: Progressing Better from admission Goal: Activity appropriate for discharge plan Outcome: Not Progressing Bedrest. P.t. Saw pt today Goal: Other Discharge Outcomes/Goals Outcome: Progressing See above note

## 2015-04-17 NOTE — Consult Note (Signed)
Pt seems somewhat improved.  Has infected peritoneal fluid on iv antibiotics. Chest clear, heart RRR, abd soft.  Teeth horrible.  Hgb 10. plt ct 113, WBC 8.8 creat 1.95, BUN 33, sed rate 27, CRP 14.2, neg Hepatitis A,B,C.ferritin normal.  Will look through old records today for any clues to further help her treatment.  Pt asked me to be her primary doctor but I explained to her my consultative role.

## 2015-04-18 ENCOUNTER — Encounter: Payer: Self-pay | Admitting: Internal Medicine

## 2015-04-18 DIAGNOSIS — K652 Spontaneous bacterial peritonitis: Secondary | ICD-10-CM | POA: Diagnosis present

## 2015-04-18 LAB — BASIC METABOLIC PANEL
Anion gap: 2 — ABNORMAL LOW (ref 5–15)
BUN: 33 mg/dL — ABNORMAL HIGH (ref 6–20)
CHLORIDE: 111 mmol/L (ref 101–111)
CO2: 23 mmol/L (ref 22–32)
CREATININE: 1.79 mg/dL — AB (ref 0.44–1.00)
Calcium: 7.4 mg/dL — ABNORMAL LOW (ref 8.9–10.3)
GFR, EST AFRICAN AMERICAN: 29 mL/min — AB (ref >60)
GFR, EST NON AFRICAN AMERICAN: 25 mL/min — AB (ref >60)
GLUCOSE: 102 mg/dL — AB (ref 65–99)
POTASSIUM: 3.8 mmol/L (ref 3.5–5.1)
Sodium: 136 mmol/L (ref 135–145)

## 2015-04-18 LAB — GLUCOSE, CAPILLARY
Glucose-Capillary: 104 mg/dL — ABNORMAL HIGH (ref 65–99)
Glucose-Capillary: 160 mg/dL — ABNORMAL HIGH (ref 65–99)
Glucose-Capillary: 83 mg/dL (ref 65–99)
Glucose-Capillary: 85 mg/dL (ref 65–99)

## 2015-04-18 LAB — CBC
HCT: 31.4 % — ABNORMAL LOW (ref 35.0–47.0)
HEMOGLOBIN: 10.5 g/dL — AB (ref 12.0–16.0)
MCH: 32.1 pg (ref 26.0–34.0)
MCHC: 33.4 g/dL (ref 32.0–36.0)
MCV: 96.1 fL (ref 80.0–100.0)
PLATELETS: 124 10*3/uL — AB (ref 150–440)
RBC: 3.26 MIL/uL — ABNORMAL LOW (ref 3.80–5.20)
RDW: 14.5 % (ref 11.5–14.5)
WBC: 6.6 10*3/uL (ref 3.6–11.0)

## 2015-04-18 LAB — BODY FLUID CULTURE: Culture: NO GROWTH

## 2015-04-18 MED ORDER — FUROSEMIDE 20 MG PO TABS: 20.0000 mg | ORAL_TABLET | Freq: Every day | ORAL | Status: AC

## 2015-04-18 MED ORDER — NYSTATIN 100000 UNIT/ML MT SUSP
5.0000 mL | Freq: Four times a day (QID) | OROMUCOSAL | Status: DC
  Administered 2015-04-18: 500000 [IU] via OROMUCOSAL
  Filled 2015-04-18: qty 5

## 2015-04-18 MED ORDER — NADOLOL 20 MG PO TABS: 20.0000 mg | ORAL_TABLET | Freq: Every day | ORAL | Status: AC

## 2015-04-18 MED ORDER — NYSTATIN 100000 UNIT/ML MT SUSP: 5.0000 mL | Freq: Four times a day (QID) | OROMUCOSAL | Status: AC

## 2015-04-18 MED ORDER — OXYCODONE HCL 5 MG PO TABS: 5.0000 mg | ORAL_TABLET | Freq: Three times a day (TID) | ORAL | Status: DC | PRN

## 2015-04-18 MED ORDER — ALPRAZOLAM 0.25 MG PO TABS: 0.2500 mg | ORAL_TABLET | Freq: Every evening | ORAL | Status: DC | PRN

## 2015-04-18 MED ORDER — CIPROFLOXACIN HCL 250 MG PO TABS: 250.0000 mg | ORAL_TABLET | Freq: Two times a day (BID) | ORAL | Status: DC

## 2015-04-18 NOTE — Plan of Care (Signed)
Problem: Discharge Progression Outcomes Goal: Other Discharge Outcomes/Goals Plan of care progress to goal: Pain: denies pain Hemodynamically:VSS Activity: tolerating  Xanax given x1 for anxiety with relief, planning discharge home today

## 2015-04-18 NOTE — Discharge Instructions (Signed)
°  DIET:  Diabetic diet  DISCHARGE CONDITION:  Stable  ACTIVITY:  Activity as tolerated  OXYGEN:  Home Oxygen: No.   Oxygen Delivery: room air  DISCHARGE LOCATION:  Home with home health   If you experience worsening of your admission symptoms, develop shortness of breath, life threatening emergency, suicidal or homicidal thoughts you must seek medical attention immediately by calling 911 or calling your MD immediately  if symptoms less severe.  You Must read complete instructions/literature along with all the possible adverse reactions/side effects for all the Medicines you take and that have been prescribed to you. Take any new Medicines after you have completely understood and accpet all the possible adverse reactions/side effects.   Please note  You were cared for by a hospitalist during your hospital stay. If you have any questions about your discharge medications or the care you received while you were in the hospital after you are discharged, you can call the unit and asked to speak with the hospitalist on call if the hospitalist that took care of you is not available. Once you are discharged, your primary care physician will handle any further medical issues. Please note that NO REFILLS for any discharge medications will be authorized once you are discharged, as it is imperative that you return to your primary care physician (or establish a relationship with a primary care physician if you do not have one) for your aftercare needs so that they can reassess your need for medications and monitor your lab values.

## 2015-04-18 NOTE — Care Management Important Message (Signed)
Important Message  Patient Details  Name: Becky Roberts MRN: [id number] Date of Birth: [birth year]   Medicare Important Message Given:  Yes-second notification given    [name] 04/18/2015, 11:06 AM

## 2015-04-18 NOTE — Care Management (Addendum)
Discharge to home today per Dr. Darvin Neighbours. Will be followed by [facility] for nursing and physical therapy. Physical therapy recommended a wheelchair. Ms. Becky Roberts has lower extremetry weakness which prevents her from ambulating long distances and in perform her daily activities of living. A wheelchair would allow her to perform her activities more safely.  Updated Will [name], durable medical equipment representative for [employer]. Son will transport. Shelbie Ammons RN MSN Care Management [phone]

## 2015-04-18 NOTE — Progress Notes (Signed)
Discharge order to home with home health.  Reviewed discharge instructions with patient and multiple family members.  IV discontinued.  Pt discharged.

## 2015-04-19 ENCOUNTER — Telehealth: Payer: Self-pay

## 2015-04-19 LAB — ALPHA-1 ANTITRYPSIN PHENOTYPE: A-1 Antitrypsin, Ser: 120 mg/dL (ref 90–200)

## 2015-04-19 NOTE — Telephone Encounter (Signed)
Unable to reach patient.

## 2015-04-20 ENCOUNTER — Telehealth: Payer: Self-pay

## 2015-04-20 NOTE — Telephone Encounter (Signed)
Patient was dismissed from [employer].  TCM call not to be completed, per administrative Team Lead.

## 2015-04-21 LAB — CYTOLOGY - NON PAP

## 2015-04-21 NOTE — Discharge Summary (Signed)
[place] [facility] [place] NAME: Becky Roberts    MR#:  [id number]  DATE OF BIRTH:  October 07, 1931  DATE OF ADMISSION:  04/14/2015 ADMITTING PHYSICIAN: Hillary Bow, MD  DATE OF DISCHARGE: 04/18/2015  3:31 PM  PRIMARY CARE PHYSICIAN: No primary care provider on file.    ADMISSION DIAGNOSIS:  Generalized abdominal pain [R10.84] UTI (lower urinary tract infection) [N39.0] Ascites [R18.8] Non-intractable vomiting with nausea, vomiting of unspecified type [R11.2]  DISCHARGE DIAGNOSIS:  Active Problems:   Abdominal pain   UTI (urinary tract infection)   Enteritis   Ascites   SBP (spontaneous bacterial peritonitis)   SECONDARY DIAGNOSIS:   Past Medical History  Diagnosis Date  . Blood transfusion reaction   . Diabetes mellitus   . Depression   . Hypertension   . Anemia 2012    hgb 5.5, admitted,  workup multifactorial,  b12 deficiency  . Arthritis   . Asthma   . Portal hypertension February 2014    CT suggested a small nodular liver, multiple varices adjacent to the spleen and inferior mediastinum and paraesophageal region. No ascites.  . Breast cancer July 2,2013    T1c,N0,M0. ER + PR- , HER-2/neu not overexpressing..  . Cirrhosis   . Cholelithiases   . CKD (chronic kidney disease) stage 3, GFR 30-59 ml/min      ADMITTING HISTORY  Becky Roberts is a 81 y.o. female with a known history of retention, diabetes, cirrhosis of unknown etiology presents to the emergency room complaining of 2 days of abdominal pain, vomiting and diarrhea. She mentions that she has had on and off vomiting and abdominal pain for 8 years. Also on and off diarrhea. Pain is worse today and came to the emergency room due to her son. She does not take any pain medications at home. She has noticed no melena or blood in stools. No hematochezia. Does not drink alcohol. Here she has been found to have moderate ascites, cirrhosis, enteritis on CT scan  of the abdomen. She is not aware of having cirrhosis. CT scan of the abdomen checked in 2014 showed varices and cirrhosis. She was supposed to have cholecystectomy due to cholelithiasis for her chronic abdominal pain but this was held due to cirrhosis.   HOSPITAL COURSE:   * SBP On IV abx 12,000 WBC in peritoneal fluid. 88% Neutrophils Cx negative Ceftriaxone changed to Ciprofloxacin at discharge. OP GI and PCP f/u  * ARF over CKD3 - likely due to large volume paracentesis Improved and close to baseline  * Enteritis On IV abx. Changed to IV at discharge  * Diarrhea, unclear etiology, questionable related to focal enteritis, liver cirrhosis, C. difficile negative. Improved.  * Urinary Tract infection.  Resolved  * Liver cirrhosis of unknown etiology GI f/u after discharge  * Anxiety Started on Xanax BID PRN   CONSULTS OBTAINED:  Treatment Team:  Manya Silvas, MD  DRUG ALLERGIES:   Allergies  Allergen Reactions  . Sulfa Antibiotics Hives  . Vioxx [Rofecoxib] Hives and Itching  . Aspirin Palpitations    DISCHARGE MEDICATIONS:   Discharge Medication List as of 04/18/2015  2:58 PM    START taking these medications   Details  ALPRAZolam (XANAX) 0.25 MG tablet Take 1 tablet (0.25 mg total) by mouth at bedtime as needed for anxiety., Starting 04/18/2015, Until Discontinued, Print    ciprofloxacin (CIPRO) 250 MG tablet Take 1 tablet (250 mg total) by mouth 2 (two) times daily., Starting 04/18/2015,  Until Discontinued, Print    furosemide (LASIX) 20 MG tablet Take 1 tablet (20 mg total) by mouth daily., Starting 04/18/2015, Until Discontinued, Normal    nadolol (CORGARD) 20 MG tablet Take 1 tablet (20 mg total) by mouth daily., Starting 04/18/2015, Until Discontinued, Print    nystatin (MYCOSTATIN) 100000 UNIT/ML suspension Use as directed 5 mLs (500,000 Units total) in the mouth or throat 4 (four) times daily., Starting 04/18/2015, Until Discontinued, Print     oxyCODONE (OXY IR/ROXICODONE) 5 MG immediate release tablet Take 1 tablet (5 mg total) by mouth every 8 (eight) hours as needed for moderate pain., Starting 04/18/2015, Until Discontinued, Print      CONTINUE these medications which have NOT CHANGED   Details  albuterol (PROVENTIL HFA;VENTOLIN HFA) 108 (90 BASE) MCG/ACT inhaler Inhale 2 puffs into the lungs every 6 (six) hours as needed for wheezing., Starting 12/17/2012, Until Discontinued, Normal    Calcium Carbonate-Vitamin D (CALTRATE 600+D PO) Take 2 tablets by mouth daily., Until Discontinued, Historical Med    chlorhexidine (PERIDEX) 0.12 % solution Use as directed 15 mLs in the mouth or throat 2 (two) times daily., Starting 01/13/2014, Until Discontinued, Normal    cyanocobalamin (,VITAMIN B-12,) 1000 MCG/ML injection Inject 1 mL (1,000 mcg total) into the muscle every 30 (thirty) days., Starting 07/09/2012, Until Discontinued, Normal    diazepam (VALIUM) 2 MG tablet Take 2 mg by mouth 3 (three) times daily as needed for anxiety., Until Discontinued, Historical Med    exemestane (AROMASIN) 25 MG tablet Take 25 mg by mouth daily. , Until Discontinued, Historical Med    insulin aspart protamine- aspart (NOVOLOG MIX 70/30) (70-30) 100 UNIT/ML injection Inject 38-40 Units into the skin 2 (two) times daily with a meal. Pt uses 38 units with breakfast and 40 units with dinner., Until Discontinued, Historical Med    insulin detemir (LEVEMIR) 100 UNIT/ML injection 16 to 20 units daily, Normal    losartan (COZAAR) 100 MG tablet Take 1 tablet (100 mg total) by mouth daily., Starting 12/08/2013, Until Discontinued, Normal    metoCLOPramide (REGLAN) 10 MG tablet Take 10 mg by mouth 3 (three) times daily before meals. , Until Discontinued, Historical Med    metoprolol tartrate (LOPRESSOR) 25 MG tablet TAKE ONE TABLET TWICE DAILY, Normal    omeprazole (PRILOSEC) 20 MG capsule Take 20 mg by mouth daily., Until Discontinued, Historical Med     rOPINIRole (REQUIP) 0.5 MG tablet Take 1 mg by mouth at bedtime., Until Discontinued, Historical Med    simvastatin (ZOCOR) 40 MG tablet Take 40 mg by mouth at bedtime., Until Discontinued, Historical Med    Vitamin D, Ergocalciferol, (DRISDOL) 50000 UNITS CAPS capsule Take 50,000 Units by mouth every 7 (seven) days. Pt takes on Monday., Until Discontinued, Historical Med      STOP taking these medications     HYDROcodone-acetaminophen (NORCO) 10-325 MG per tablet      Insulin Syringe-Needle U-100 31G X 5/16" 1 ML MISC      ondansetron (ZOFRAN) 4 MG tablet      promethazine (PHENERGAN) 12.5 MG tablet      SYRINGE-NEEDLE, DISP, 3 ML (B-D SYRINGE/NEEDLE 3CC/25GX5/8) 25G X 5/8" 3 ML MISC      Syringe/Needle, Disp, (SYRINGE LUER LOCK) 25G X 5/8" 3 ML MISC      TDaP (BOOSTRIX) 5-2.5-18.5 LF-MCG/0.5 injection          Today    VITAL SIGNS:  Blood pressure 115/49, pulse 77, temperature 98.2 F (36.8 C), temperature source  Oral, resp. rate 16, height 5' 5" (1.651 m), weight 99.292 kg (218 lb 14.4 oz), SpO2 99 %.  I/O:  No intake or output data in the 24 hours ending 04/21/15 1326  PHYSICAL EXAMINATION:  Physical Exam  GENERAL:  81 y.o.-year-old patient lying in the bed with no acute distress.  LUNGS: Normal breath sounds bilaterally, no wheezing, rales,rhonchi or crepitation. No use of accessory muscles of respiration.  CARDIOVASCULAR: S1, S2 normal. No murmurs, rubs, or gallops.  ABDOMEN: Soft, non-tender, non-distended. Bowel sounds present. No organomegaly or mass.  NEUROLOGIC: Moves all 4 extremities. PSYCHIATRIC: The patient is alert and oriented x 3.  SKIN: No obvious rash, lesion, or ulcer.   DATA REVIEW:   CBC  Recent Labs Lab 04/18/15 0401  WBC 6.6  HGB 10.5*  HCT 31.4*  PLT 124*    Chemistries   Recent Labs Lab 04/15/15 1259  04/18/15 0401  NA 141  < > 136  K 4.6  < > 3.8  CL 114*  < > 111  CO2 19*  < > 23  GLUCOSE 96  < > 102*  BUN 22*  < >  33*  CREATININE 1.65*  < > 1.79*  CALCIUM 8.0*  < > 7.4*  AST 39  --   --   ALT 25  --   --   ALKPHOS 60  --   --   BILITOT 1.2  --   --   < > = values in this interval not displayed.  Cardiac Enzymes No results for input(s): TROPONINI in the last 168 hours.  Microbiology Results  Results for orders placed or performed during the hospital encounter of 04/14/15  Urine culture     Status: None   Collection Time: 04/14/15  1:50 PM  Result Value Ref Range Status   Specimen Description URINE, CATHETERIZED  Final   Special Requests NONE  Final   Culture >=100,000 COLONIES/mL GRAM POSITIVE RODS  Final   Report Status 04/16/2015 FINAL  Final  C difficile quick scan w PCR reflex ([facility] only)     Status: None   Collection Time: 04/14/15 10:04 PM  Result Value Ref Range Status   C Diff antigen NEGATIVE  Final   C Diff toxin NEGATIVE  Final   C Diff interpretation Negative for C. difficile  Final  Body fluid culture     Status: None   Collection Time: 04/15/15  3:11 PM  Result Value Ref Range Status   Specimen Description PERITONEAL  Final   Special Requests NONE  Final   Gram Stain MODERATE WBC SEEN NO ORGANISMS SEEN   Final   Culture NO GROWTH 3 DAYS  Final   Report Status 04/18/2015 FINAL  Final    RADIOLOGY:  No results found.    Follow up with PCP in 1 week.  Management plans discussed with the patient, family and they are in agreement.  CODE STATUS:   TOTAL TIME TAKING CARE OF THIS PATIENT ON DAY OF DISCHARGE: more than 30 minutes.    Hillary Bow R M.D on 04/21/2015 at 1:26 PM  Between 7am to 6pm - Pager - [phone]  After 6pm go to [email or web address] - password [email or web address]  [facility]  Office  [phone]  CC: Primary care physician; No primary care provider on file.

## 2015-04-22 LAB — PATHOLOGIST SMEAR REVIEW

## 2015-05-10 ENCOUNTER — Emergency Department: Payer: Medicare HMO

## 2015-05-10 ENCOUNTER — Inpatient Hospital Stay
Admission: EM | Admit: 2015-05-10 | Discharge: 2015-05-19 | DRG: 853 | Disposition: A | Discharge status: Skilled Nursing Facility | Payer: Medicare HMO | Attending: Specialist | Admitting: Specialist

## 2015-05-10 ENCOUNTER — Encounter: Payer: Self-pay | Admitting: Emergency Medicine

## 2015-05-10 DIAGNOSIS — M1990 Unspecified osteoarthritis, unspecified site: Secondary | ICD-10-CM | POA: Diagnosis present

## 2015-05-10 DIAGNOSIS — N170 Acute kidney failure with tubular necrosis: Secondary | ICD-10-CM | POA: Diagnosis present

## 2015-05-10 DIAGNOSIS — Z882 Allergy status to sulfonamides status: Secondary | ICD-10-CM | POA: Diagnosis not present

## 2015-05-10 DIAGNOSIS — K658 Other peritonitis: Secondary | ICD-10-CM | POA: Diagnosis not present

## 2015-05-10 DIAGNOSIS — N184 Chronic kidney disease, stage 4 (severe): Secondary | ICD-10-CM | POA: Diagnosis present

## 2015-05-10 DIAGNOSIS — F329 Major depressive disorder, single episode, unspecified: Secondary | ICD-10-CM | POA: Diagnosis present

## 2015-05-10 DIAGNOSIS — I5032 Chronic diastolic (congestive) heart failure: Secondary | ICD-10-CM | POA: Diagnosis present

## 2015-05-10 DIAGNOSIS — D649 Anemia, unspecified: Secondary | ICD-10-CM | POA: Diagnosis present

## 2015-05-10 DIAGNOSIS — K652 Spontaneous bacterial peritonitis: Secondary | ICD-10-CM | POA: Diagnosis present

## 2015-05-10 DIAGNOSIS — Z886 Allergy status to analgesic agent status: Secondary | ICD-10-CM

## 2015-05-10 DIAGNOSIS — R188 Other ascites: Secondary | ICD-10-CM | POA: Diagnosis present

## 2015-05-10 DIAGNOSIS — N939 Abnormal uterine and vaginal bleeding, unspecified: Secondary | ICD-10-CM | POA: Diagnosis not present

## 2015-05-10 DIAGNOSIS — E440 Moderate protein-calorie malnutrition: Secondary | ICD-10-CM | POA: Diagnosis present

## 2015-05-10 DIAGNOSIS — Z87891 Personal history of nicotine dependence: Secondary | ICD-10-CM

## 2015-05-10 DIAGNOSIS — E119 Type 2 diabetes mellitus without complications: Secondary | ICD-10-CM

## 2015-05-10 DIAGNOSIS — Z66 Do not resuscitate: Secondary | ICD-10-CM | POA: Diagnosis present

## 2015-05-10 DIAGNOSIS — E1122 Type 2 diabetes mellitus with diabetic chronic kidney disease: Secondary | ICD-10-CM | POA: Diagnosis present

## 2015-05-10 DIAGNOSIS — Z515 Encounter for palliative care: Secondary | ICD-10-CM | POA: Diagnosis not present

## 2015-05-10 DIAGNOSIS — J449 Chronic obstructive pulmonary disease, unspecified: Secondary | ICD-10-CM | POA: Diagnosis present

## 2015-05-10 DIAGNOSIS — G2581 Restless legs syndrome: Secondary | ICD-10-CM | POA: Diagnosis present

## 2015-05-10 DIAGNOSIS — E876 Hypokalemia: Secondary | ICD-10-CM | POA: Diagnosis not present

## 2015-05-10 DIAGNOSIS — Z6830 Body mass index (BMI) 30.0-30.9, adult: Secondary | ICD-10-CM | POA: Diagnosis not present

## 2015-05-10 DIAGNOSIS — N938 Other specified abnormal uterine and vaginal bleeding: Secondary | ICD-10-CM | POA: Diagnosis present

## 2015-05-10 DIAGNOSIS — Z79899 Other long term (current) drug therapy: Secondary | ICD-10-CM | POA: Diagnosis not present

## 2015-05-10 DIAGNOSIS — R32 Unspecified urinary incontinence: Secondary | ICD-10-CM | POA: Diagnosis present

## 2015-05-10 DIAGNOSIS — K7460 Unspecified cirrhosis of liver: Secondary | ICD-10-CM | POA: Diagnosis present

## 2015-05-10 DIAGNOSIS — C50919 Malignant neoplasm of unspecified site of unspecified female breast: Secondary | ICD-10-CM | POA: Diagnosis present

## 2015-05-10 DIAGNOSIS — I340 Nonrheumatic mitral (valve) insufficiency: Secondary | ICD-10-CM | POA: Diagnosis not present

## 2015-05-10 DIAGNOSIS — R197 Diarrhea, unspecified: Secondary | ICD-10-CM | POA: Diagnosis not present

## 2015-05-10 DIAGNOSIS — Z7401 Bed confinement status: Secondary | ICD-10-CM

## 2015-05-10 DIAGNOSIS — R159 Full incontinence of feces: Secondary | ICD-10-CM | POA: Diagnosis present

## 2015-05-10 DIAGNOSIS — I129 Hypertensive chronic kidney disease with stage 1 through stage 4 chronic kidney disease, or unspecified chronic kidney disease: Secondary | ICD-10-CM | POA: Diagnosis present

## 2015-05-10 DIAGNOSIS — R110 Nausea: Secondary | ICD-10-CM | POA: Diagnosis not present

## 2015-05-10 DIAGNOSIS — E669 Obesity, unspecified: Secondary | ICD-10-CM | POA: Diagnosis present

## 2015-05-10 DIAGNOSIS — B957 Other staphylococcus as the cause of diseases classified elsewhere: Secondary | ICD-10-CM | POA: Diagnosis not present

## 2015-05-10 DIAGNOSIS — J45909 Unspecified asthma, uncomplicated: Secondary | ICD-10-CM | POA: Diagnosis present

## 2015-05-10 DIAGNOSIS — K766 Portal hypertension: Secondary | ICD-10-CM | POA: Diagnosis present

## 2015-05-10 DIAGNOSIS — A419 Sepsis, unspecified organism: Secondary | ICD-10-CM | POA: Diagnosis present

## 2015-05-10 DIAGNOSIS — C801 Malignant (primary) neoplasm, unspecified: Secondary | ICD-10-CM | POA: Diagnosis present

## 2015-05-10 DIAGNOSIS — A047 Enterocolitis due to Clostridium difficile: Secondary | ICD-10-CM | POA: Diagnosis present

## 2015-05-10 DIAGNOSIS — I10 Essential (primary) hypertension: Secondary | ICD-10-CM | POA: Diagnosis present

## 2015-05-10 DIAGNOSIS — G8220 Paraplegia, unspecified: Secondary | ICD-10-CM | POA: Diagnosis not present

## 2015-05-10 DIAGNOSIS — I8510 Secondary esophageal varices without bleeding: Secondary | ICD-10-CM | POA: Diagnosis not present

## 2015-05-10 DIAGNOSIS — J90 Pleural effusion, not elsewhere classified: Secondary | ICD-10-CM | POA: Diagnosis not present

## 2015-05-10 LAB — URINALYSIS COMPLETE WITH MICROSCOPIC (ARMC ONLY)
BILIRUBIN URINE: NEGATIVE
Bacteria, UA: NONE SEEN
GLUCOSE, UA: NEGATIVE mg/dL
HGB URINE DIPSTICK: NEGATIVE
KETONES UR: NEGATIVE mg/dL
Leukocytes, UA: NEGATIVE
Nitrite: NEGATIVE
PH: 6 (ref 5.0–8.0)
Protein, ur: NEGATIVE mg/dL
Specific Gravity, Urine: 1.013 (ref 1.005–1.030)

## 2015-05-10 LAB — CBC
HEMATOCRIT: 40.2 % (ref 35.0–47.0)
Hemoglobin: 13.4 g/dL (ref 12.0–16.0)
MCH: 32.1 pg (ref 26.0–34.0)
MCHC: 33.4 g/dL (ref 32.0–36.0)
MCV: 96.2 fL (ref 80.0–100.0)
PLATELETS: 142 10*3/uL — AB (ref 150–440)
RBC: 4.18 MIL/uL (ref 3.80–5.20)
RDW: 15.9 % — ABNORMAL HIGH (ref 11.5–14.5)
WBC: 4.7 10*3/uL (ref 3.6–11.0)

## 2015-05-10 LAB — BODY FLUID CELL COUNT WITH DIFFERENTIAL
EOS FL: 0 %
LYMPHS FL: 4 %
MONOCYTE-MACROPHAGE-SEROUS FLUID: 8 % — AB (ref 50–90)
NEUTROPHIL FLUID: 88 % — AB (ref 0–25)
Other Cells, Fluid: 0 %
Total Nucleated Cell Count, Fluid: 12258 cu mm — ABNORMAL HIGH (ref 0–1000)

## 2015-05-10 LAB — COMPREHENSIVE METABOLIC PANEL
ALT: 17 U/L (ref 14–54)
ANION GAP: 8 (ref 5–15)
AST: 46 U/L — AB (ref 15–41)
Albumin: 2.1 g/dL — ABNORMAL LOW (ref 3.5–5.0)
Alkaline Phosphatase: 80 U/L (ref 38–126)
BILIRUBIN TOTAL: 2 mg/dL — AB (ref 0.3–1.2)
BUN: 9 mg/dL (ref 6–20)
CALCIUM: 8.1 mg/dL — AB (ref 8.9–10.3)
CO2: 28 mmol/L (ref 22–32)
Chloride: 105 mmol/L (ref 101–111)
Creatinine, Ser: 1.35 mg/dL — ABNORMAL HIGH (ref 0.44–1.00)
GFR, EST AFRICAN AMERICAN: 41 mL/min — AB (ref >60)
GFR, EST NON AFRICAN AMERICAN: 36 mL/min — AB (ref >60)
Glucose, Bld: 122 mg/dL — ABNORMAL HIGH (ref 65–99)
Potassium: 3.3 mmol/L — ABNORMAL LOW (ref 3.5–5.1)
SODIUM: 141 mmol/L (ref 135–145)
Total Protein: 5.3 g/dL — ABNORMAL LOW (ref 6.5–8.1)

## 2015-05-10 LAB — LIPASE, BLOOD: LIPASE: 34 U/L (ref 22–51)

## 2015-05-10 LAB — LACTIC ACID, PLASMA
LACTIC ACID, VENOUS: 2.1 mmol/L — AB (ref 0.5–2.0)
Lactic Acid, Venous: 2.8 mmol/L (ref 0.5–2.0)

## 2015-05-10 LAB — TROPONIN I

## 2015-05-10 MED ORDER — HEPARIN SODIUM (PORCINE) 5000 UNIT/ML IJ SOLN
5000.0000 [IU] | Freq: Three times a day (TID) | INTRAMUSCULAR | Status: DC
  Administered 2015-05-11 – 2015-05-12 (×5): 5000 [IU] via SUBCUTANEOUS
  Filled 2015-05-10 (×5): qty 1

## 2015-05-10 MED ORDER — SODIUM CHLORIDE 0.9 % IV BOLUS (SEPSIS)
1000.0000 mL | INTRAVENOUS | Status: AC
  Administered 2015-05-10: 1000 mL via INTRAVENOUS

## 2015-05-10 MED ORDER — ONDANSETRON HCL 4 MG/2ML IJ SOLN
4.0000 mg | Freq: Once | INTRAMUSCULAR | Status: AC
  Administered 2015-05-10: 4 mg via INTRAVENOUS
  Filled 2015-05-10: qty 2

## 2015-05-10 MED ORDER — DIAZEPAM 2 MG PO TABS
2.0000 mg | ORAL_TABLET | Freq: Three times a day (TID) | ORAL | Status: DC | PRN
  Administered 2015-05-12 – 2015-05-19 (×7): 2 mg via ORAL
  Filled 2015-05-10 (×7): qty 1

## 2015-05-10 MED ORDER — PANTOPRAZOLE SODIUM 40 MG PO TBEC
40.0000 mg | DELAYED_RELEASE_TABLET | Freq: Every day | ORAL | Status: DC
  Administered 2015-05-11 – 2015-05-19 (×9): 40 mg via ORAL
  Filled 2015-05-10 (×9): qty 1

## 2015-05-10 MED ORDER — LIDOCAINE HCL (PF) 1 % IJ SOLN
INTRAMUSCULAR | Status: AC
  Administered 2015-05-10: 5 mL
  Filled 2015-05-10: qty 5

## 2015-05-10 MED ORDER — DEXTROSE 5 % IV SOLN
2.0000 g | INTRAVENOUS | Status: DC
  Administered 2015-05-10 – 2015-05-14 (×5): 2 g via INTRAVENOUS
  Filled 2015-05-10 (×6): qty 2

## 2015-05-10 MED ORDER — LIDOCAINE HCL (PF) 1 % IJ SOLN
5.0000 mL | Freq: Once | INTRAMUSCULAR | Status: AC
  Administered 2015-05-10: 5 mL

## 2015-05-10 MED ORDER — OXYCODONE HCL 5 MG PO TABS
5.0000 mg | ORAL_TABLET | Freq: Three times a day (TID) | ORAL | Status: DC | PRN
  Administered 2015-05-11 – 2015-05-19 (×6): 5 mg via ORAL
  Filled 2015-05-10 (×6): qty 1

## 2015-05-10 MED ORDER — IOHEXOL 240 MG/ML SOLN: 50.0000 mL | INTRAMUSCULAR | Status: AC

## 2015-05-10 MED ORDER — METOCLOPRAMIDE HCL 10 MG PO TABS
10.0000 mg | ORAL_TABLET | Freq: Three times a day (TID) | ORAL | Status: DC
  Administered 2015-05-11 – 2015-05-14 (×11): 10 mg via ORAL
  Filled 2015-05-10 (×12): qty 1

## 2015-05-10 MED ORDER — SIMVASTATIN 40 MG PO TABS
40.0000 mg | ORAL_TABLET | Freq: Every day | ORAL | Status: DC
  Administered 2015-05-11 – 2015-05-18 (×9): 40 mg via ORAL
  Filled 2015-05-10 (×9): qty 1

## 2015-05-10 MED ORDER — ONDANSETRON HCL 4 MG PO TABS
4.0000 mg | ORAL_TABLET | Freq: Four times a day (QID) | ORAL | Status: DC | PRN
  Administered 2015-05-18 – 2015-05-19 (×3): 4 mg via ORAL
  Filled 2015-05-10 (×3): qty 1

## 2015-05-10 MED ORDER — VANCOMYCIN HCL IN DEXTROSE 1-5 GM/200ML-% IV SOLN
1000.0000 mg | Freq: Once | INTRAVENOUS | Status: AC
  Administered 2015-05-10: 1000 mg via INTRAVENOUS
  Filled 2015-05-10: qty 200

## 2015-05-10 MED ORDER — SODIUM CHLORIDE 0.9 % IV SOLN
INTRAVENOUS | Status: AC
  Administered 2015-05-11: via INTRAVENOUS

## 2015-05-10 MED ORDER — VANCOMYCIN HCL 10 G IV SOLR: 1.0000 g | Freq: Once | INTRAVENOUS | Status: DC

## 2015-05-10 MED ORDER — EXEMESTANE 25 MG PO TABS
25.0000 mg | ORAL_TABLET | Freq: Every day | ORAL | Status: DC
  Administered 2015-05-12 – 2015-05-19 (×8): 25 mg via ORAL
  Filled 2015-05-10 (×10): qty 1

## 2015-05-10 MED ORDER — ROPINIROLE HCL 1 MG PO TABS
1.0000 mg | ORAL_TABLET | Freq: Every day | ORAL | Status: DC
Start: 2015-05-10 — End: 2015-05-19
  Administered 2015-05-11 – 2015-05-18 (×9): 1 mg via ORAL
  Filled 2015-05-10 (×9): qty 1

## 2015-05-10 MED ORDER — MORPHINE SULFATE 2 MG/ML IJ SOLN
2.0000 mg | INTRAMUSCULAR | Status: DC | PRN
  Administered 2015-05-16: 2 mg via INTRAVENOUS
  Filled 2015-05-10: qty 1

## 2015-05-10 MED ORDER — INSULIN ASPART 100 UNIT/ML ~~LOC~~ SOLN
0.0000 [IU] | Freq: Four times a day (QID) | SUBCUTANEOUS | Status: DC
  Administered 2015-05-12 (×2): 1 [IU] via SUBCUTANEOUS
  Filled 2015-05-10 (×2): qty 1

## 2015-05-10 MED ORDER — IOHEXOL 240 MG/ML SOLN
50.0000 mL | INTRAMUSCULAR | Status: AC
  Administered 2015-05-10 (×3): 50 mL via ORAL

## 2015-05-10 MED ORDER — CETYLPYRIDINIUM CHLORIDE 0.05 % MT LIQD
7.0000 mL | Freq: Two times a day (BID) | OROMUCOSAL | Status: DC
  Administered 2015-05-10 – 2015-05-19 (×17): 7 mL via OROMUCOSAL

## 2015-05-10 MED ORDER — SODIUM CHLORIDE 0.9 % IV BOLUS (SEPSIS)
1500.0000 mL | INTRAVENOUS | Status: AC
  Administered 2015-05-10: 1500 mL via INTRAVENOUS

## 2015-05-10 MED ORDER — SODIUM CHLORIDE 0.9 % IJ SOLN
3.0000 mL | Freq: Two times a day (BID) | INTRAMUSCULAR | Status: DC
  Administered 2015-05-11 – 2015-05-12 (×3): 3 mL via INTRAVENOUS

## 2015-05-10 MED ORDER — ALBUTEROL SULFATE (2.5 MG/3ML) 0.083% IN NEBU: 3.0000 mL | INHALATION_SOLUTION | Freq: Four times a day (QID) | RESPIRATORY_TRACT | Status: DC | PRN

## 2015-05-10 MED ORDER — ONDANSETRON HCL 4 MG/2ML IJ SOLN: 4.0000 mg | Freq: Four times a day (QID) | INTRAMUSCULAR | Status: DC | PRN

## 2015-05-10 MED ORDER — NADOLOL 20 MG PO TABS
20.0000 mg | ORAL_TABLET | Freq: Every day | ORAL | Status: DC
  Administered 2015-05-11: 20 mg via ORAL
  Filled 2015-05-10 (×2): qty 1

## 2015-05-10 NOTE — ED Notes (Signed)
Pt arrived via EMS from home.  States she hasn't felt good over the past few days.  C/o N/V/D.  Has had more than 3 loose stools daily since about Sunday.  Reports dry heaving with nausea. Has home health come to the house.  Pt feels warm to the touch, but is unsure if she has had any fevers. Pt alert but frequently drowsy.

## 2015-05-10 NOTE — H&P (Signed)
[place] [facility] [place] NAME: Becky Roberts    MR#:  [id number]  DATE OF BIRTH:  21-Jun-1932  DATE OF ADMISSION:  05/10/2015  PRIMARY CARE PHYSICIAN: No primary care provider on file.   REQUESTING/REFERRING PHYSICIAN: Karma Greaser, MD  CHIEF COMPLAINT:   Chief Complaint  Patient presents with  . Nausea  . Emesis  . Diarrhea    HISTORY OF PRESENT ILLNESS:  Becky Roberts  is a 81 y.o. female who presents with progressive nausea and vomiting and abdominal pain. Patient has a history of liver cirrhosis with ascites, and has had cases of spontaneous bacterial peritonitis before. She also has chronic nausea and vomiting due to gallbladder disease with gallstones, for which she has been unable to receive cholecystectomy due to her comorbid medical conditions. She states that her nausea vomiting and abdominal pain got worse this time over the last 3-4 days, to the point that today she decided to come in and be evaluated. She states that she has also been having some chills at home. She endorses chronic intermittent diarrhea, currently having diarrhea at this time as well. She denies any blood in her stool or emesis. She states that nothing has really made her abdominal pain better or worse. In the ED she was found to be febrile and tachypnea, meeting criteria for SIRS. Diagnostic thoracentesis showed 12,000 white cells in her peritoneal fluid, and her lactic acid was elevated at 2.8. Hospitalists were called for admission for sepsis due to SBP.  PAST MEDICAL HISTORY:   Past Medical History  Diagnosis Date  . Blood transfusion reaction   . Diabetes mellitus   . Depression   . Hypertension   . Anemia 2012    hgb 5.5, admitted,  workup multifactorial,  b12 deficiency  . Arthritis   . Asthma   . Portal hypertension February 2014    CT suggested a small nodular liver, multiple varices adjacent to the spleen and inferior mediastinum and  paraesophageal region. No ascites.  . Breast cancer July 2,2013    T1c,N0,M0. ER + PR- , HER-2/neu not overexpressing..  . Cirrhosis   . Cholelithiases   . CKD (chronic kidney disease) stage 3, GFR 30-59 ml/min     PAST SURGICAL HISTORY:   Past Surgical History  Procedure Laterality Date  . Breast biopsy  July 2013    [name]: wide excision   . Colonoscopy  2012    SOCIAL HISTORY:   History  Substance Use Topics  . Smoking status: Former Research scientist (life sciences)  . Smokeless tobacco: Never Used  . Alcohol Use: No    FAMILY HISTORY:   Family History  Problem Relation Age of Onset  . Arthritis Mother   . Hypertension Mother   . Diabetes Mother   . Arthritis Father   . Hypertension Father   . Diabetes Father   . Arthritis Other   . Cancer Other     breast/lung    DRUG ALLERGIES:   Allergies  Allergen Reactions  . Sulfa Antibiotics Hives  . Tylenol [Acetaminophen] Other (See Comments)    Has cirrhosis of the liver secondary to Tylenol use.  . Vioxx [Rofecoxib] Hives and Itching  . Aspirin Palpitations    MEDICATIONS AT HOME:   Prior to Admission medications   Medication Sig Start Date End Date Taking? Authorizing Provider  albuterol (PROVENTIL HFA;VENTOLIN HFA) 108 (90 BASE) MCG/ACT inhaler Inhale 2 puffs into the lungs every 6 (six) hours as needed for wheezing. 12/17/12  Crecencio Mc, MD  ALPRAZolam Duanne Moron) 0.25 MG tablet Take 1 tablet (0.25 mg total) by mouth at bedtime as needed for anxiety. 04/18/15   Srikar Sudini, MD  Calcium Carbonate-Vitamin D (CALTRATE 600+D PO) Take 2 tablets by mouth daily.    Historical Provider, MD  chlorhexidine (PERIDEX) 0.12 % solution Use as directed 15 mLs in the mouth or throat 2 (two) times daily. 01/13/14   Crecencio Mc, MD  ciprofloxacin (CIPRO) 250 MG tablet Take 1 tablet (250 mg total) by mouth 2 (two) times daily. 04/18/15   Srikar Sudini, MD  cyanocobalamin (,VITAMIN B-12,) 1000 MCG/ML injection Inject 1 mL (1,000 mcg total) into the  muscle every 30 (thirty) days. Patient taking differently: Inject 1,000 mcg into the muscle every 30 (thirty) days. Pt uses on the 28th of every month. 07/09/12   Crecencio Mc, MD  diazepam (VALIUM) 2 MG tablet Take 2 mg by mouth 3 (three) times daily as needed for anxiety.    Historical Provider, MD  exemestane (AROMASIN) 25 MG tablet Take 25 mg by mouth daily.     Historical Provider, MD  furosemide (LASIX) 20 MG tablet Take 1 tablet (20 mg total) by mouth daily. 04/18/15   Srikar Sudini, MD  insulin aspart protamine- aspart (NOVOLOG MIX 70/30) (70-30) 100 UNIT/ML injection Inject 38-40 Units into the skin 2 (two) times daily with a meal. Pt uses 38 units with breakfast and 40 units with dinner.    Historical Provider, MD  insulin detemir (LEVEMIR) 100 UNIT/ML injection 16 to 20 units daily Patient taking differently: Inject 16-20 Units into the skin 3 (three) times daily as needed (for high blood sugar). Pt uses as needed per sliding scale. 12/16/12   Crecencio Mc, MD  losartan (COZAAR) 100 MG tablet Take 1 tablet (100 mg total) by mouth daily. 12/08/13   Crecencio Mc, MD  metoCLOPramide (REGLAN) 10 MG tablet Take 10 mg by mouth 3 (three) times daily before meals.     Historical Provider, MD  metoprolol tartrate (LOPRESSOR) 25 MG tablet TAKE ONE TABLET TWICE DAILY Patient taking differently: Take 25 mg by mouth 2 (two) times daily.  04/14/14   Minna Merritts, MD  nadolol (CORGARD) 20 MG tablet Take 1 tablet (20 mg total) by mouth daily. 04/18/15   Srikar Sudini, MD  nystatin (MYCOSTATIN) 100000 UNIT/ML suspension Use as directed 5 mLs (500,000 Units total) in the mouth or throat 4 (four) times daily. 04/18/15   Srikar Sudini, MD  omeprazole (PRILOSEC) 20 MG capsule Take 20 mg by mouth daily.    Historical Provider, MD  oxyCODONE (OXY IR/ROXICODONE) 5 MG immediate release tablet Take 1 tablet (5 mg total) by mouth every 8 (eight) hours as needed for moderate pain. 04/18/15   Srikar Sudini, MD   rOPINIRole (REQUIP) 0.5 MG tablet Take 1 mg by mouth at bedtime.    Historical Provider, MD  simvastatin (ZOCOR) 40 MG tablet Take 40 mg by mouth at bedtime.    Historical Provider, MD  Vitamin D, Ergocalciferol, (DRISDOL) 50000 UNITS CAPS capsule Take 50,000 Units by mouth every 7 (seven) days. Pt takes on Monday.    Historical Provider, MD    REVIEW OF SYSTEMS:  Review of Systems  Constitutional: Positive for chills and malaise/fatigue. Negative for fever and weight loss.  HENT: Negative for ear pain, hearing loss and tinnitus.   Eyes: Negative for blurred vision, double vision, pain and redness.  Respiratory: Negative for cough, hemoptysis and shortness of  breath.   Cardiovascular: Negative for chest pain, palpitations, orthopnea and leg swelling.  Gastrointestinal: Positive for nausea, vomiting, abdominal pain and diarrhea. Negative for constipation.  Genitourinary: Negative for dysuria, frequency and hematuria.  Musculoskeletal: Negative for back pain, joint pain and neck pain.  Skin:       No acne, rash, or lesions  Neurological: Negative for dizziness, tremors, focal weakness and weakness.  Endo/Heme/Allergies: Negative for polydipsia. Does not bruise/bleed easily.  Psychiatric/Behavioral: Negative for depression. The patient is not nervous/anxious and does not have insomnia.      VITAL SIGNS:   Filed Vitals:   05/10/15 2000 05/10/15 2025 05/10/15 2030 05/10/15 2057  BP: 76/50 98/45 98/45  97/44  Pulse: 70 78 78 81  Temp:      TempSrc:      Resp: 23 22 23 24   Height:      Weight:      SpO2: 97% 100% 100% 99%   Wt Readings from Last 3 Encounters:  05/10/15 83.915 kg (185 lb)  04/18/15 99.292 kg (218 lb 14.4 oz)  02/18/14 86.183 kg (190 lb)    PHYSICAL EXAMINATION:  Physical Exam  Vitals reviewed. Constitutional: She is oriented to person, place, and time. She appears well-developed and well-nourished. No distress.  HENT:  Head: Normocephalic and atraumatic.   Mouth/Throat: Oropharynx is clear and moist.  Eyes: Conjunctivae and EOM are normal. Pupils are equal, round, and reactive to light. No scleral icterus.  Neck: Normal range of motion. Neck supple. No JVD present. No thyromegaly present.  Cardiovascular: Normal rate, regular rhythm and intact distal pulses.  Exam reveals no gallop and no friction rub.   No murmur heard. Respiratory: Effort normal and breath sounds normal. No respiratory distress. She has no wheezes. She has no rales.  GI: Soft. She exhibits distension (With positive fluid wave). There is tenderness (diffuse tenderness to palpation).  Musculoskeletal: Normal range of motion. She exhibits no edema.  No arthritis, no gout  Lymphadenopathy:    She has no cervical adenopathy.  Neurological: She is alert and oriented to person, place, and time. No cranial nerve deficit.  No dysarthria, no aphasia  Skin: Skin is warm and dry. No rash noted. No erythema.  Psychiatric: She has a normal mood and affect. Her behavior is normal. Judgment and thought content normal.    LABORATORY PANEL:   CBC  Recent Labs Lab 05/10/15 1524  WBC 4.7  HGB 13.4  HCT 40.2  PLT 142*   ------------------------------------------------------------------------------------------------------------------  Chemistries   Recent Labs Lab 05/10/15 1524  NA 141  K 3.3*  CL 105  CO2 28  GLUCOSE 122*  BUN 9  CREATININE 1.35*  CALCIUM 8.1*  AST 46*  ALT 17  ALKPHOS 80  BILITOT 2.0*   ------------------------------------------------------------------------------------------------------------------  Cardiac Enzymes  Recent Labs Lab 05/10/15 1554  TROPONINI <0.03   ------------------------------------------------------------------------------------------------------------------  RADIOLOGY:  Ct Abdomen Pelvis Wo Contrast  05/10/2015   CLINICAL DATA:  Nausea, vomiting, diarrhea  EXAM: CT ABDOMEN AND PELVIS WITHOUT CONTRAST  TECHNIQUE:  Multidetector CT imaging of the abdomen and pelvis was performed following the standard protocol without IV contrast.  COMPARISON:  Seven/ 14/16  FINDINGS: Sagittal images of the spine shows again degenerative changes in lower thoracic spine. Degenerative changes lumbar spine L4-L5 level. Study is limited without IV contrast. Bilateral small pleural effusion with bilateral lower lobe atelectasis. Cirrhosis of the liver again noted. There is abundant perihepatic and perisplenic ascites. Abundant ascites noted bilateral paracolic gutters left greater than right.  Multiple calcified gallstones are again noted within gallbladder. Atherosclerotic calcifications of abdominal aorta and iliac arteries.  No aortic aneurysm. Moderate distended proximal small bowel loops probable due to enteritis especially and in jejunum. There is no definite evidence of small bowel obstruction. Distal small bowel is smaller caliber probable empty partially collapsed.  Unenhanced uterus and ovaries unremarkable. The terminal ileum is unremarkable.  Paraesophageal varices again noted. Oral contrast material was given to the patient. No evidence of gastric outlet obstruction.  Unenhanced kidney shows no nephrolithiasis. No hydronephrosis or hydroureter. Unenhanced pancreas, spleen and adrenal glands are unremarkable.  IMPRESSION: 1. Again noted cirrhosis of the liver. Again noted abdominal and pelvic ascites. 2. There are moderate distended jejunal loops probable due to enteritis or ileus. No definite evidence of small bowel obstruction. 3. No hydronephrosis or hydroureter is. Again noted paraesophageal varices. 4. Bilateral small pleural effusion with bilateral lower lobe posterior atelectasis. 5. No evidence of gastric outlet obstruction.   Electronically Signed   By: Lahoma Crocker M.D.   On: 05/10/2015 19:53    EKG:   Orders placed or performed during the hospital encounter of 05/10/15  . EKG 12-Lead  . EKG 12-Lead    IMPRESSION AND  PLAN:  Principal Problem:   Sepsis - broad spectrum IV antibiotics started in the ED, we will continue these on admission. Blood and peritoneal fluid cultures sent, lactic acid was elevated so we will continue to trend this and we'll administer gentle fluids for this as well as for blood pressure support. We'll monitor her closely for fluid overload, however. Active Problems:   SBP (spontaneous bacterial peritonitis) - Broad antibiotics as above, culture sent as above. Monitor and follow up.    Type II diabetes mellitus -  we'll keep the patient nothing by mouth for now due to her significant nausea vomiting. Hold home insulin and oral anti-glycemic, administer sliding scale insulin with corresponding glucose checks every 6 hours while nothing by mouth. Check A1c.    Hypertension - hold home antihypertensives for now as the patient is borderline hypotensive, except for her not alone which is for prevention of varices. Fluids for blood pressure support as above.    Cirrhosis of liver not due to alcohol - avoid hepatotoxins, renally dose medications, we're enlisting pharmacy support for antibiotic  dosing.    COPD (chronic obstructive pulmonary disease) -  continue home rescue inhaler when necessary.    Breast cancer - continue home dose hormone therapy    Chronic diastolic congestive heart failure - hold home dose Lasix for now due to her blood pressure, consider restarting this once her blood pressure improves.    Restless legs syndrome - continue home dose Requip   All the records are reviewed and case discussed with ED provider. Management plans discussed with the patient and/or family.  DVT PROPHYLAXIS: SubQ heparin  ADMISSION STATUS: Inpatient  CODE STATUS: DO NOT RESUSCITATE  TOTAL TIME TAKING CARE OF THIS PATIENT: 45 minutes.    Becky Roberts 05/10/2015, 9:04 PM  [facility]  Office  [phone]  CC: Primary care physician; No primary care provider on  file.

## 2015-05-10 NOTE — ED Notes (Signed)
Return from CT scan.  AAOx3.  Skin warm and dry.

## 2015-05-10 NOTE — ED Provider Notes (Signed)
[facility] Emergency Department Provider Note  ____________________________________________  Time seen: Approximately 3:17 PM  I have reviewed the triage vital signs and the nursing notes.   HISTORY  Chief Complaint Nausea; Emesis; and Diarrhea    HPI Becky Roberts is a 81 y.o. female with a complicated past medical history that includes admission approximately one month ago with a severe urinary tract infection and spontaneous bacterial peritonitis with white blood cells numbering approximately 13,000 in a sample of her ascites, although no specific organisms ever grew out in the culture.  She reportedly has gallbladder disease but is not a good candidate for surgery.  She also has possible neoplastic disease.  Her eyes today by EMS for "not feeling well" over the last several days and developing nausea and vomiting today.  She has also had increased loose stools for 3 days.  She has been afebrile and denies any fevers or chills.  She describes her overall condition is severe but other than the vomiting she does not have any specific complaints.  She does endorse persistent abdominal pain but it is unclear if this is any worse than it has been over the last month.   Past Medical History  Diagnosis Date  . Blood transfusion reaction   . Diabetes mellitus   . Depression   . Hypertension   . Anemia 2012    hgb 5.5, admitted,  workup multifactorial,  b12 deficiency  . Arthritis   . Asthma   . Portal hypertension February 2014    CT suggested a small nodular liver, multiple varices adjacent to the spleen and inferior mediastinum and paraesophageal region. No ascites.  . Breast cancer July 2,2013    T1c,N0,M0. ER + PR- , HER-2/neu not overexpressing..  . Cirrhosis   . Cholelithiases   . CKD (chronic kidney disease) stage 3, GFR 30-59 ml/min     Patient Active Problem List   Diagnosis Date Noted  . SBP (spontaneous bacterial peritonitis) 04/18/2015   . Abdominal pain 04/14/2015  . UTI (urinary tract infection) 04/14/2015  . Enteritis 04/14/2015  . Ascites 04/14/2015  . Cirrhosis of liver not due to alcohol 02/14/2014  . Chronic pain syndrome 01/14/2014  . Diarrhea 01/14/2014  . Urinary frequency 01/14/2014  . Gingivitis, chronic 01/13/2014  . Noncompliance of patient with dietary regimen 09/07/2013  . History of breast cancer 08/17/2013  . Dental infection 05/02/2013  . Restless legs syndrome 01/21/2013  . Chronic diastolic congestive heart failure 12/03/2012  . Chronic cholecystitis with calculus 11/05/2012  . Obesity (BMI 30-39.9) 04/17/2012  . Type II or unspecified type diabetes mellitus without mention of complication, not stated as uncontrolled   . Depression   . Hypertension   . Arthritis   . COPD (chronic obstructive pulmonary disease)   . Breast cancer 03/31/2012  . Generalized muscle weakness 03/06/2012    Past Surgical History  Procedure Laterality Date  . Breast biopsy  July 2013    [name]: wide excision   . Colonoscopy  2012    Current Outpatient Rx  Name  Route  Sig  Dispense  Refill  . albuterol (PROVENTIL HFA;VENTOLIN HFA) 108 (90 BASE) MCG/ACT inhaler   Inhalation   Inhale 2 puffs into the lungs every 6 (six) hours as needed for wheezing.   1 Inhaler   11   . ALPRAZolam (XANAX) 0.25 MG tablet   Oral   Take 1 tablet (0.25 mg total) by mouth at bedtime as needed for anxiety.   15  tablet   0   . Calcium Carbonate-Vitamin D (CALTRATE 600+D PO)   Oral   Take 2 tablets by mouth daily.         . chlorhexidine (PERIDEX) 0.12 % solution   Mouth/Throat   Use as directed 15 mLs in the mouth or throat 2 (two) times daily.   120 mL   2   . ciprofloxacin (CIPRO) 250 MG tablet   Oral   Take 1 tablet (250 mg total) by mouth 2 (two) times daily.   20 tablet   0   . cyanocobalamin (,VITAMIN B-12,) 1000 MCG/ML injection   Intramuscular   Inject 1 mL (1,000 mcg total) into the muscle every 30  (thirty) days. Patient taking differently: Inject 1,000 mcg into the muscle every 30 (thirty) days. Pt uses on the 28th of every month.   10 mL   12   . diazepam (VALIUM) 2 MG tablet   Oral   Take 2 mg by mouth 3 (three) times daily as needed for anxiety.         Marland Kitchen exemestane (AROMASIN) 25 MG tablet   Oral   Take 25 mg by mouth daily.          . furosemide (LASIX) 20 MG tablet   Oral   Take 1 tablet (20 mg total) by mouth daily.   30 tablet   0   . insulin aspart protamine- aspart (NOVOLOG MIX 70/30) (70-30) 100 UNIT/ML injection   Subcutaneous   Inject 38-40 Units into the skin 2 (two) times daily with a meal. Pt uses 38 units with breakfast and 40 units with dinner.         . insulin detemir (LEVEMIR) 100 UNIT/ML injection      16 to 20 units daily Patient taking differently: Inject 16-20 Units into the skin 3 (three) times daily as needed (for high blood sugar). Pt uses as needed per sliding scale.   15 mL   12   . losartan (COZAAR) 100 MG tablet   Oral   Take 1 tablet (100 mg total) by mouth daily.   90 tablet   3   . metoCLOPramide (REGLAN) 10 MG tablet   Oral   Take 10 mg by mouth 3 (three) times daily before meals.          . metoprolol tartrate (LOPRESSOR) 25 MG tablet      TAKE ONE TABLET TWICE DAILY Patient taking differently: Take 25 mg by mouth 2 (two) times daily.    180 tablet   3   . nadolol (CORGARD) 20 MG tablet   Oral   Take 1 tablet (20 mg total) by mouth daily.   30 tablet   0   . nystatin (MYCOSTATIN) 100000 UNIT/ML suspension   Mouth/Throat   Use as directed 5 mLs (500,000 Units total) in the mouth or throat 4 (four) times daily.   120 mL   0   . omeprazole (PRILOSEC) 20 MG capsule   Oral   Take 20 mg by mouth daily.         Marland Kitchen oxyCODONE (OXY IR/ROXICODONE) 5 MG immediate release tablet   Oral   Take 1 tablet (5 mg total) by mouth every 8 (eight) hours as needed for moderate pain.   30 tablet   0   . rOPINIRole  (REQUIP) 0.5 MG tablet   Oral   Take 1 mg by mouth at bedtime.         Marland Kitchen  simvastatin (ZOCOR) 40 MG tablet   Oral   Take 40 mg by mouth at bedtime.         . Vitamin D, Ergocalciferol, (DRISDOL) 50000 UNITS CAPS capsule   Oral   Take 50,000 Units by mouth every 7 (seven) days. Pt takes on Monday.           Allergies Sulfa antibiotics; Vioxx; and Aspirin  Family History  Problem Relation Age of Onset  . Arthritis Mother   . Hypertension Mother   . Diabetes Mother   . Arthritis Father   . Hypertension Father   . Diabetes Father   . Arthritis Other   . Cancer Other     breast/lung    Social History History  Substance Use Topics  . Smoking status: Former Research scientist (life sciences)  . Smokeless tobacco: Never Used  . Alcohol Use: No    Review of Systems Constitutional: No fever/chills Eyes: No visual changes. ENT: No sore throat. Cardiovascular: Denies chest pain. Respiratory: Denies shortness of breath. Gastrointestinal: Generalized abdominal pain with ascites.  Multiple episodes of vomiting and diarrhea, the diarrhea for 3 days and the vomiting today.  No constipation. Genitourinary: Negative for dysuria. Musculoskeletal: Negative for back pain. Skin: Negative for rash. Neurological: Negative for headaches, focal weakness or numbness.  10-point ROS otherwise negative.  ____________________________________________   PHYSICAL EXAM:  VITAL SIGNS: ED Triage Vitals  Enc Vitals Group     BP 05/10/15 1514 114/95 mmHg     Pulse Rate 05/10/15 1514 77     Resp 05/10/15 1514 17     Temp 05/10/15 1514 98.2 F (36.8 C)     Temp Source 05/10/15 1514 Oral     SpO2 05/10/15 1514 91 %     Weight 05/10/15 1514 185 lb (83.915 kg)     Height 05/10/15 1514 _0  (1.575 m)     Head Cir --      Peak Flow --      Pain Score 05/10/15 1519 0     Pain Loc --      Pain Edu? --      Excl. in [place]? --      Constitutional: Alert and oriented.  Has a bruise of chronic illness but does not  appear acutely ill and is in no acute distress Eyes: Conjunctivae are normal. PERRL. EOMI. Head: Atraumatic. Nose: No congestion/rhinnorhea. Mouth/Throat: Mucous membranes are moist.  Oropharynx non-erythematous. Neck: No stridor.   Cardiovascular: Normal rate, regular rhythm. Grossly normal heart sounds.  Good peripheral circulation. Respiratory: Normal respiratory effort.  No retractions. Lungs CTAB. Gastrointestinal: Soft with ascites.  Generalized, nonfocal tenderness throughout. Musculoskeletal: No lower extremity tenderness nor edema.  No joint effusions. Neurologic:  Normal speech and language. No gross focal neurologic deficits are appreciated.  Skin:  Skin is warm, dry and intact. No rash noted. Psychiatric: Mood and affect are normal. Speech and behavior are normal.  ____________________________________________   LABS (all labs ordered are listed, but only abnormal results are displayed)  Labs Reviewed  COMPREHENSIVE METABOLIC PANEL - Abnormal; Notable for the following:    Potassium 3.3 (*)    Glucose, Bld 122 (*)    Creatinine, Ser 1.35 (*)    Calcium 8.1 (*)    Total Protein 5.3 (*)    Albumin 2.1 (*)    AST 46 (*)    Total Bilirubin 2.0 (*)    GFR calc non Af Amer 36 (*)    GFR calc Af Amer 41 (*)  All other components within normal limits  CBC - Abnormal; Notable for the following:    RDW 15.9 (*)    Platelets 142 (*)    All other components within normal limits  URINALYSIS COMPLETEWITH MICROSCOPIC ([facility] ONLY) - Abnormal; Notable for the following:    Color, Urine YELLOW (*)    APPearance CLEAR (*)    Squamous Epithelial / LPF 0-5 (*)    All other components within normal limits  BODY FLUID CELL COUNT WITH DIFFERENTIAL - Abnormal; Notable for the following:    Appearance, Fluid CLOUDY (*)    WBC, Fluid 12258 (*)    Neutrophil Count, Fluid 88 (*)    Monocyte-Macrophage-Serous Fluid 8 (*)    All other components within normal limits  LACTIC ACID, PLASMA  - Abnormal; Notable for the following:    Lactic Acid, Venous 2.8 (*)    All other components within normal limits  BODY FLUID CULTURE  CULTURE, BLOOD (ROUTINE X 2)  CULTURE, BLOOD (ROUTINE X 2)  LIPASE, BLOOD  TROPONIN I  LACTIC ACID, PLASMA  PATHOLOGIST SMEAR REVIEW   ____________________________________________  EKG  ED ECG REPORT I, Jemya Depierro, the attending physician, personally viewed and interpreted this ECG.  Date: 05/10/2015 EKG Time: 15:15 Rate: 80 Rhythm: normal sinus rhythm QRS Axis: Left axis deviation Intervals: normal ST/T Wave abnormalities: normal Conduction Disutrbances: none Narrative Interpretation: unremarkable  ____________________________________________  RADIOLOGY  Ct Abdomen Pelvis Wo Contrast  05/10/2015   CLINICAL DATA:  Nausea, vomiting, diarrhea  EXAM: CT ABDOMEN AND PELVIS WITHOUT CONTRAST  TECHNIQUE: Multidetector CT imaging of the abdomen and pelvis was performed following the standard protocol without IV contrast.  COMPARISON:  Seven/ 14/16  FINDINGS: Sagittal images of the spine shows again degenerative changes in lower thoracic spine. Degenerative changes lumbar spine L4-L5 level. Study is limited without IV contrast. Bilateral small pleural effusion with bilateral lower lobe atelectasis. Cirrhosis of the liver again noted. There is abundant perihepatic and perisplenic ascites. Abundant ascites noted bilateral paracolic gutters left greater than right. Multiple calcified gallstones are again noted within gallbladder. Atherosclerotic calcifications of abdominal aorta and iliac arteries.  No aortic aneurysm. Moderate distended proximal small bowel loops probable due to enteritis especially and in jejunum. There is no definite evidence of small bowel obstruction. Distal small bowel is smaller caliber probable empty partially collapsed.  Unenhanced uterus and ovaries unremarkable. The terminal ileum is unremarkable.  Paraesophageal varices again  noted. Oral contrast material was given to the patient. No evidence of gastric outlet obstruction.  Unenhanced kidney shows no nephrolithiasis. No hydronephrosis or hydroureter. Unenhanced pancreas, spleen and adrenal glands are unremarkable.  IMPRESSION: 1. Again noted cirrhosis of the liver. Again noted abdominal and pelvic ascites. 2. There are moderate distended jejunal loops probable due to enteritis or ileus. No definite evidence of small bowel obstruction. 3. No hydronephrosis or hydroureter is. Again noted paraesophageal varices. 4. Bilateral small pleural effusion with bilateral lower lobe posterior atelectasis. 5. No evidence of gastric outlet obstruction.   Electronically Signed   By: Lahoma Crocker M.D.   On: 05/10/2015 19:53    ____________________________________________   PROCEDURES  Procedure(s) performed: Diagnostic Paracentesis, see procedure note(s).  DIAGNOSTIC ABDOMINAL PARACENTESIS Date/Time: 05/10/2015 6:36 PM Performed by: Hinda Kehr  Consent: Verbal consent obtained. Written consent obtained. Risks and benefits: risks, benefits and alternatives were discussed Consent given by: patient  Patient understanding: patient states understanding of the procedure being performed Patient consent: the patient's understanding of the procedure matches consent given Procedure  consent: procedure consent matches procedure scheduled Relevant documents: relevant documents present and verified Test results: test results available and properly labeled Site marked: the operative site was marked Imaging studies: imaging studies available Required items: required blood products, implants, devices, and special equipment available Patient identity confirmed: verbally with patient and arm band Time out: Immediately prior to procedure a "time out" was called to verify the correct patient, procedure, equipment, support staff and site/side marked as required.  Preparation: Patient was prepped  and draped in the usual sterile fashion. Local anesthesia used: yes Ultrasound guidance:  yes Anesthesia: local infiltration Local anesthetic: lidocaine 1% without epinephrine Anesthetic total: 5 ml Patient sedated: no Patient tolerance: Patient tolerated the procedure well with no immediate complications  I withdrew approximately 60 mL of dark yellow opaque fluid (abnormal appearance).  There was no bleeding and no concerns for intestinal perforation.   Critical Care performed: Yes, see critical care note(s)   CRITICAL CARE Performed by: Hinda Kehr   Total critical care time: 30 minutes  Critical care time was exclusive of separately billable procedures and treating other patients.  Critical care was necessary to treat or prevent imminent or life-threatening deterioration.  Critical care was time spent personally by me on the following activities: development of treatment plan with patient and/or surrogate as well as nursing, discussions with consultants, evaluation of patient's response to treatment, examination of patient, obtaining history from patient or surrogate, ordering and performing treatments and interventions, ordering and review of laboratory studies, ordering and review of radiographic studies, pulse oximetry and re-evaluation of patient's condition.  ____________________________________________   INITIAL IMPRESSION / ASSESSMENT AND PLAN / ED COURSE  Pertinent labs & imaging results that were available during my care of the patient were reviewed by me and considered in my medical decision making (see chart for details).  Initially the patient was afebrile and had a normal heart rate and essentially normal blood pressure.  However, she later developed a fever to 103 rectal.  Given her history of SBP, I was already in the process of planning a diagnostic paracentesis, which I accelerated given her fever and her normal I/O cath urinalysis.  I performed a paracentesis  which was uncomplicated (please see procedure note).  I am treating her empirically with ceftriaxone 2 g and vancomycin 1 g.  She is going to also proceed with CT scan with by mouth contrast only given her poor GFR.  Anticipate admission after obtaining the results of the CT scan.  ----------------------------------------- 8:18 PM on 05/10/2015 -----------------------------------------  CT scan was unremarkable.  Initially upon presentation/arrival the patient did not appear septic, but as she was in the emergency department she began looking more ill as described above with fever and then a slowly dropping her pressure.  I obtained a lactate which was 2.8 after just under 1 L of fluids.  I ordered 30 mils per kilo of IV fluids to meet goals for sepsis treatment.  She has arty received ceftriaxone 2 g IV and vancomycin 1 g IV (cefotaxime is on national shortage, and ceftriaxone 2 g is the recommended alternative), coverage for presumed spontaneous bacterial peritonitis.  The diagnostic paracentesis is positive.  I discussed the case with the hospitalist who will admit. ____________________________________________  FINAL CLINICAL IMPRESSION(S) / ED DIAGNOSES  Final diagnoses:  Spontaneous bacterial peritonitis  Sepsis, due to unspecified organism      NEW MEDICATIONS STARTED DURING THIS VISIT:  New Prescriptions   No medications on file  Hinda Kehr, MD 05/10/15 2217

## 2015-05-10 NOTE — ED Notes (Signed)
MD at bedside. Diagnostic paracentesis preparation at bedside.

## 2015-05-11 LAB — CBC
HCT: 32.8 % — ABNORMAL LOW (ref 35.0–47.0)
Hemoglobin: 10.9 g/dL — ABNORMAL LOW (ref 12.0–16.0)
MCH: 32 pg (ref 26.0–34.0)
MCHC: 33.3 g/dL (ref 32.0–36.0)
MCV: 96.2 fL (ref 80.0–100.0)
Platelets: 98 10*3/uL — ABNORMAL LOW (ref 150–440)
RBC: 3.41 MIL/uL — ABNORMAL LOW (ref 3.80–5.20)
RDW: 16 % — ABNORMAL HIGH (ref 11.5–14.5)
WBC: 9.4 10*3/uL (ref 3.6–11.0)

## 2015-05-11 LAB — BASIC METABOLIC PANEL
Anion gap: 8 (ref 5–15)
BUN: 13 mg/dL (ref 6–20)
CALCIUM: 7.5 mg/dL — AB (ref 8.9–10.3)
CO2: 25 mmol/L (ref 22–32)
CREATININE: 1.69 mg/dL — AB (ref 0.44–1.00)
Chloride: 109 mmol/L (ref 101–111)
GFR calc Af Amer: 32 mL/min — ABNORMAL LOW (ref >60)
GFR calc non Af Amer: 27 mL/min — ABNORMAL LOW (ref >60)
Glucose, Bld: 102 mg/dL — ABNORMAL HIGH (ref 65–99)
Potassium: 3.2 mmol/L — ABNORMAL LOW (ref 3.5–5.1)
Sodium: 142 mmol/L (ref 135–145)

## 2015-05-11 LAB — HEMOGLOBIN A1C: Hgb A1c MFr Bld: 5.3 % (ref 4.0–6.0)

## 2015-05-11 LAB — PATHOLOGIST SMEAR REVIEW

## 2015-05-11 LAB — GLUCOSE, CAPILLARY
GLUCOSE-CAPILLARY: 93 mg/dL (ref 65–99)
Glucose-Capillary: 90 mg/dL (ref 65–99)
Glucose-Capillary: 109 mg/dL — ABNORMAL HIGH (ref 65–99)
Glucose-Capillary: 91 mg/dL (ref 65–99)

## 2015-05-11 LAB — CLOSTRIDIUM DIFFICILE BY PCR: Toxigenic C. Difficile by PCR: NEGATIVE

## 2015-05-11 LAB — C DIFFICILE QUICK SCREEN W PCR REFLEX
C DIFFICILE (CDIFF) TOXIN: NEGATIVE
C Diff antigen: POSITIVE — AB

## 2015-05-11 LAB — MAGNESIUM: MAGNESIUM: 1.3 mg/dL — AB (ref 1.7–2.4)

## 2015-05-11 MED ORDER — VANCOMYCIN HCL IN DEXTROSE 1-5 GM/200ML-% IV SOLN
1000.0000 mg | INTRAVENOUS | Status: DC
  Administered 2015-05-11: 1000 mg via INTRAVENOUS
  Filled 2015-05-11 (×2): qty 200

## 2015-05-11 MED ORDER — VANCOMYCIN HCL IN DEXTROSE 1-5 GM/200ML-% IV SOLN
1000.0000 mg | INTRAVENOUS | Status: DC
  Administered 2015-05-12: 1000 mg via INTRAVENOUS
  Filled 2015-05-11 (×2): qty 200

## 2015-05-11 MED ORDER — SPIRONOLACTONE 25 MG PO TABS
25.0000 mg | ORAL_TABLET | Freq: Every day | ORAL | Status: DC
  Administered 2015-05-11: 25 mg via ORAL
  Filled 2015-05-11 (×2): qty 1

## 2015-05-11 MED ORDER — POTASSIUM CHLORIDE CRYS ER 20 MEQ PO TBCR
40.0000 meq | EXTENDED_RELEASE_TABLET | Freq: Once | ORAL | Status: AC
  Administered 2015-05-11: 40 meq via ORAL
  Filled 2015-05-11: qty 2

## 2015-05-11 NOTE — Progress Notes (Signed)
ANTIBIOTIC CONSULT NOTE - Follow up  Pharmacy Consult for vancomycin and ceftriaxone dosing Indication: sepsis/SBP  Allergies  Allergen Reactions  . Sulfa Antibiotics Hives  . Tylenol [Acetaminophen] Other (See Comments)    Has cirrhosis of the liver secondary to Tylenol use.  . Vioxx [Rofecoxib] Hives and Itching  . Aspirin Palpitations    Patient Measurements: Height: 5' 2"  (157.5 cm) Weight: 198 lb (89.812 kg) IBW/kg (Calculated) : 50.1 Adjusted Body Weight: 66 kg  Vital Signs: Temp: 98.1 F (36.7 C) (08/10 1129) Temp Source: Oral (08/10 1129) BP: 96/33 mmHg (08/10 1129) Pulse Rate: 80 (08/10 1129)  Labs:  Recent Labs  05/10/15 1524 05/11/15 0426  WBC 4.7 9.4  HGB 13.4 10.9*  PLT 142* 98*  CREATININE 1.35* 1.69*   Estimated Creatinine Clearance: 27.2 mL/min (by C-G formula based on Cr of 1.69).  Microbiology: Recent Results (from the past 720 hour(s))  Urine culture     Status: None   Collection Time: 04/14/15  1:50 PM  Result Value Ref Range Status   Specimen Description URINE, CATHETERIZED  Final   Special Requests NONE  Final   Culture >=100,000 COLONIES/mL GRAM POSITIVE RODS  Final   Report Status 04/16/2015 FINAL  Final  C difficile quick scan w PCR reflex ([facility] only)     Status: None   Collection Time: 04/14/15 10:04 PM  Result Value Ref Range Status   C Diff antigen NEGATIVE  Final   C Diff toxin NEGATIVE  Final   C Diff interpretation Negative for C. difficile  Final  Body fluid culture     Status: None   Collection Time: 04/15/15  3:11 PM  Result Value Ref Range Status   Specimen Description PERITONEAL  Final   Special Requests NONE  Final   Gram Stain MODERATE WBC SEEN NO ORGANISMS SEEN   Final   Culture NO GROWTH 3 DAYS  Final   Report Status 04/18/2015 FINAL  Final  Body fluid culture     Status: None (Preliminary result)   Collection Time: 05/10/15  6:30 PM  Result Value Ref Range Status   Specimen Description PERITONEAL  Final    Special Requests Normal  Final   Gram Stain PENDING  Incomplete   Culture NO GROWTH 1 DAY  Final   Report Status PENDING  Incomplete  Blood culture (routine x 2)     Status: None (Preliminary result)   Collection Time: 05/10/15  6:58 PM  Result Value Ref Range Status   Specimen Description BLOOD LEFT ANTECUBITAL  Final   Special Requests BOTTLES DRAWN AEROBIC AND ANAEROBIC 5ML  Final   Culture NO GROWTH < 24 HOURS  Final   Report Status PENDING  Incomplete  Blood culture (routine x 2)     Status: None (Preliminary result)   Collection Time: 05/10/15  6:58 PM  Result Value Ref Range Status   Specimen Description BLOOD LEFT WRIST  Final   Special Requests BOTTLES DRAWN AEROBIC AND ANAEROBIC 2ML  Final   Culture NO GROWTH < 24 HOURS  Final   Report Status PENDING  Incomplete    Medical History: Past Medical History  Diagnosis Date  . Blood transfusion reaction   . Diabetes mellitus   . Depression   . Hypertension   . Anemia 2012    hgb 5.5, admitted,  workup multifactorial,  b12 deficiency  . Arthritis   . Asthma   . Portal hypertension February 2014    CT suggested a small nodular liver,  multiple varices adjacent to the spleen and inferior mediastinum and paraesophageal region. No ascites.  . Breast cancer July 2,2013    T1c,N0,M0. ER + PR- , HER-2/neu not overexpressing..  . Cirrhosis   . Cholelithiases   . CKD (chronic kidney disease) stage 3, GFR 30-59 ml/min     Medications:  Ceftriaxone 2 gram IV Q24h Vancomycin 1 gram IV Q36h  Assessment: Blood and peritoneal fluid cx pending UA: (-)  Goal of Therapy:  Vancomycin trough level 15-20 mcg/ml  Plan:  Ceftriaxone 2 grams q 24 hours ordered (SBP dosing).  TBW 89.9kg  IBW 50.1kg  DW 66kg  Vd 46L kei 0.033 hr-1  T1/2 21 hours Vancomycin 1 gram q 24 hours ordered with stacked dosing. Level before 5th dose.  8/10: Scr increased to 1.69. Will transition to Vancomycin 1 gram IV Q36h.   Ke 0.021  T1/2 33  Vd  46.2. Trough scheduled for 8/13 at 0930.  Chinita Greenland PharmD Clinical Pharmacist 05/11/2015

## 2015-05-11 NOTE — Plan of Care (Signed)
Problem: Phase I Progression Outcomes Goal: Pain controlled with appropriate interventions Outcome: Completed/Met Date Met:  05/11/15 Abdominal pain relieved with prn meds

## 2015-05-11 NOTE — Progress Notes (Addendum)
[facility] [place] NAME: Becky Roberts    MR#:  [id number]  DATE OF BIRTH:  [birth year]  SUBJECTIVE:  CHIEF COMPLAINT:   Chief Complaint  Patient presents with  . Nausea  . Emesis  . Diarrhea   Patient is 81 year old Caucasian female with history of liver cirrhosis, ascites, who presents to the hospital with complaints of nausea, vomiting and diarrhea as well as abdominal pain. She underwent peritoneal tap yesterday on admission which revealed the more than 12,000 white blood cells, 88% of them were neutrophils signifying spontaneous bacterial peritonitis, patient was initiated on broad-spectrum antibiotic therapy with Rocephin and vancomycin. Patient admits of nausea, however, denies any more vomiting, admits of diarrheal stool, admits of abdominal pain radiating to from one side of abdomen to other as well as in pain in the lower abdomen, more on the right side. CT of abdomen and pelvis revealed liver cirrhosis, ascites distention of jejunal loops suggested enteritis Review of Systems  Constitutional: Positive for chills and malaise/fatigue. Negative for fever and weight loss.  HENT: Negative for congestion.   Eyes: Negative for blurred vision and double vision.  Respiratory: Negative for cough, sputum production, shortness of breath and wheezing.   Cardiovascular: Negative for chest pain, palpitations, orthopnea, leg swelling and PND.  Gastrointestinal: Positive for abdominal pain and diarrhea. Negative for nausea, vomiting, constipation and blood in stool.  Genitourinary: Negative for dysuria, urgency, frequency and hematuria.  Musculoskeletal: Negative for falls.  Neurological: Negative for dizziness, tremors, focal weakness and headaches.  Endo/Heme/Allergies: Does not bruise/bleed easily.  Psychiatric/Behavioral: Negative for depression. The patient does not have insomnia.     VITAL SIGNS: Blood pressure 96/33, pulse 80,  temperature 98.1 F (36.7 C), temperature source Oral, resp. rate 19, height 5\' 2"  (1.575 m), weight 89.812 kg (198 lb), SpO2 94 %.  PHYSICAL EXAMINATION:   GENERAL:  81 y.o.-year-old patient lying in the bed with no acute distress.  EYES: Pupils equal, round, reactive to light and accommodation. No scleral icterus. Extraocular muscles intact.  HEENT: Head atraumatic, normocephalic. Oropharynx and nasopharynx clear.  NECK:  Supple, no jugular venous distention. No thyroid enlargement, no tenderness.  LUNGS: Normal breath sounds bilaterally, no wheezing, rales,rhonchi or crepitation. No use of accessory muscles of respiration.  CARDIOVASCULAR: S1, S2 normal. No murmurs, rubs, or gallops.  ABDOMEN: Soft, no discomfort on palpation diffusely. No discrete areas of disc of pain was noted. No rebound or guarding , nondistended. Bowel sounds present. No organomegaly or mass.  EXTREMITIES: No pedal edema, cyanosis, or clubbing.  NEUROLOGIC: Cranial nerves II through XII are intact. Muscle strength 5/5 in all extremities. Sensation intact. Gait not checked.  PSYCHIATRIC: The patient is alert and oriented x 3.  SKIN: No obvious rash, lesion, or ulcer.   ORDERS/RESULTS REVIEWED:   CBC  Recent Labs Lab 05/10/15 1524 05/11/15 0426  WBC 4.7 9.4  HGB 13.4 10.9*  HCT 40.2 32.8*  PLT 142* 98*  MCV 96.2 96.2  MCH 32.1 32.0  MCHC 33.4 33.3  RDW 15.9* 16.0*   ------------------------------------------------------------------------------------------------------------------  Chemistries   Recent Labs Lab 05/10/15 1524 05/11/15 0426  NA 141 142  K 3.3* 3.2*  CL 105 109  CO2 28 25  GLUCOSE 122* 102*  BUN 9 13  CREATININE 1.35* 1.69*  CALCIUM 8.1* 7.5*  AST 46*  --   ALT 17  --   ALKPHOS 80  --   BILITOT 2.0*  --    ------------------------------------------------------------------------------------------------------------------  estimated creatinine clearance is 27.2 mL/min (by C-G  formula based on Cr of 1.69). ------------------------------------------------------------------------------------------------------------------ No results for input(s): TSH, T4TOTAL, T3FREE, THYROIDAB in the last 72 hours.  Invalid input(s): FREET3  Cardiac Enzymes  Recent Labs Lab 05/10/15 1554  TROPONINI <0.03   ------------------------------------------------------------------------------------------------------------------ Invalid input(s): POCBNP ---------------------------------------------------------------------------------------------------------------  RADIOLOGY: Ct Abdomen Pelvis Wo Contrast  05/10/2015   CLINICAL DATA:  Nausea, vomiting, diarrhea  EXAM: CT ABDOMEN AND PELVIS WITHOUT CONTRAST  TECHNIQUE: Multidetector CT imaging of the abdomen and pelvis was performed following the standard protocol without IV contrast.  COMPARISON:  Seven/ 14/16  FINDINGS: Sagittal images of the spine shows again degenerative changes in lower thoracic spine. Degenerative changes lumbar spine L4-L5 level. Study is limited without IV contrast. Bilateral small pleural effusion with bilateral lower lobe atelectasis. Cirrhosis of the liver again noted. There is abundant perihepatic and perisplenic ascites. Abundant ascites noted bilateral paracolic gutters left greater than right. Multiple calcified gallstones are again noted within gallbladder. Atherosclerotic calcifications of abdominal aorta and iliac arteries.  No aortic aneurysm. Moderate distended proximal small bowel loops probable due to enteritis especially and in jejunum. There is no definite evidence of small bowel obstruction. Distal small bowel is smaller caliber probable empty partially collapsed.  Unenhanced uterus and ovaries unremarkable. The terminal ileum is unremarkable.  Paraesophageal varices again noted. Oral contrast material was given to the patient. No evidence of gastric outlet obstruction.  Unenhanced kidney shows no  nephrolithiasis. No hydronephrosis or hydroureter. Unenhanced pancreas, spleen and adrenal glands are unremarkable.  IMPRESSION: 1. Again noted cirrhosis of the liver. Again noted abdominal and pelvic ascites. 2. There are moderate distended jejunal loops probable due to enteritis or ileus. No definite evidence of small bowel obstruction. 3. No hydronephrosis or hydroureter is. Again noted paraesophageal varices. 4. Bilateral small pleural effusion with bilateral lower lobe posterior atelectasis. 5. No evidence of gastric outlet obstruction.   Electronically Signed   By: Lahoma Crocker M.D.   On: 05/10/2015 19:53    EKG:  Orders placed or performed during the hospital encounter of 05/10/15  . EKG 12-Lead  . 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 Spontaneous bacterial peritonitis, continue broad-spectrum antibiotic therapy, blood cultures, as well as peritoneal fluid cultures are negative so far 2. Sepsis due to a nonbacterial organism, as above. Blood cultures are negative so far. We'll continue  broad-spectrum antibiotics at this time 3. Chronic renal failure. Follow with therapy, UA was unremarkable, not suggestive of urinary tract infection 4. Hypokalemia, supplement orally. Check magnesium level 5. Ascites. Initiate patient on spironolactone 6. Acute gastroenteritis, get stool cultures if recurrent advance diet to clear liquid diet Management plans discussed with the patient, family and they are in agreement.   DRUG ALLERGIES:  Allergies  Allergen Reactions  . Sulfa Antibiotics Hives  . Tylenol [Acetaminophen] Other (See Comments)    Has cirrhosis of the liver secondary to Tylenol  use.  . Vioxx [Rofecoxib] Hives and Itching  . Aspirin Palpitations    CODE STATUS:     Code Status Orders        Start     Ordered   05/10/15 2254  Do  not attempt resuscitation (DNR)   Continuous    Question Answer Comment  In the event of cardiac or respiratory ARREST Do not call a "code blue"   In the event of cardiac or respiratory ARREST Do not perform Intubation, CPR, defibrillation or ACLS   In the event of cardiac or respiratory ARREST Use medication by any route, position, wound care, and other measures to relive pain and suffering. May use oxygen, suction and manual treatment of airway obstruction as needed for comfort.      05/10/15 2253      TOTAL TIME TAKING CARE OF THIS PATIENT: 40 minutes.    Theodoro Grist M.D on 05/11/2015 at 2:45 PM  Between 7am to 6pm - Pager - [phone]  After 6pm go to [email or web address] - password [email or web address] [facility]  [facility]  Office  [phone]  CC: Primary care physician; No primary care provider on file.

## 2015-05-11 NOTE — Progress Notes (Signed)
MD Ether Griffins was notified of loose stools and gram + cocci in aerobic bottle. Order was to put on c diff rule out. No further orders.

## 2015-05-11 NOTE — Progress Notes (Signed)
CRITICAL VALUE ALERT  Critical value received: lactic acid 2.1  Date of notification:  05/10/15  Time of notification:  2249  Critical value read back:Yes.    Nurse who received alert:  Sabra Heck  MD notified (1st page):  Critical value remains elevated but trending down  Time of first page:  2300  MD notified (2nd page):  Time of second page:  Responding KB:TCYELYHT value remains elevated but trending down  Time MD responded: 2300

## 2015-05-11 NOTE — Progress Notes (Signed)
ANTIBIOTIC CONSULT NOTE - INITIAL  Pharmacy Consult for vancomycin and ceftriaxone dosing Indication: sepsis/SBP  Allergies  Allergen Reactions  . Sulfa Antibiotics Hives  . Tylenol [Acetaminophen] Other (See Comments)    Has cirrhosis of the liver secondary to Tylenol use.  . Vioxx [Rofecoxib] Hives and Itching  . Aspirin Palpitations    Patient Measurements: Height: 5' 2"  (157.5 cm) Weight: 198 lb 4.8 oz (89.948 kg) IBW/kg (Calculated) : 50.1 Adjusted Body Weight: 66 kg  Vital Signs: Temp: 97.8 F (36.6 C) (08/09 2254) Temp Source: Oral (08/09 2254) BP: 126/34 mmHg (08/09 2254) Pulse Rate: 74 (08/09 2254) Intake/Output from previous day:   Intake/Output from this shift:    Labs:  Recent Labs  05/10/15 1524  WBC 4.7  HGB 13.4  PLT 142*  CREATININE 1.35*   Estimated Creatinine Clearance: 34.1 mL/min (by C-G formula based on Cr of 1.35). No results for input(s): VANCOTROUGH, VANCOPEAK, VANCORANDOM, GENTTROUGH, GENTPEAK, GENTRANDOM, TOBRATROUGH, TOBRAPEAK, TOBRARND, AMIKACINPEAK, AMIKACINTROU, AMIKACIN in the last 72 hours.   Microbiology: Recent Results (from the past 720 hour(s))  Urine culture     Status: None   Collection Time: 04/14/15  1:50 PM  Result Value Ref Range Status   Specimen Description URINE, CATHETERIZED  Final   Special Requests NONE  Final   Culture >=100,000 COLONIES/mL GRAM POSITIVE RODS  Final   Report Status 04/16/2015 FINAL  Final  C difficile quick scan w PCR reflex ([facility] only)     Status: None   Collection Time: 04/14/15 10:04 PM  Result Value Ref Range Status   C Diff antigen NEGATIVE  Final   C Diff toxin NEGATIVE  Final   C Diff interpretation Negative for C. difficile  Final  Body fluid culture     Status: None   Collection Time: 04/15/15  3:11 PM  Result Value Ref Range Status   Specimen Description PERITONEAL  Final   Special Requests NONE  Final   Gram Stain MODERATE WBC SEEN NO ORGANISMS SEEN   Final   Culture NO  GROWTH 3 DAYS  Final   Report Status 04/18/2015 FINAL  Final    Medical History: Past Medical History  Diagnosis Date  . Blood transfusion reaction   . Diabetes mellitus   . Depression   . Hypertension   . Anemia 2012    hgb 5.5, admitted,  workup multifactorial,  b12 deficiency  . Arthritis   . Asthma   . Portal hypertension February 2014    CT suggested a small nodular liver, multiple varices adjacent to the spleen and inferior mediastinum and paraesophageal region. No ascites.  . Breast cancer July 2,2013    T1c,N0,M0. ER + PR- , HER-2/neu not overexpressing..  . Cirrhosis   . Cholelithiases   . CKD (chronic kidney disease) stage 3, GFR 30-59 ml/min     Medications:   Assessment: Blood and peritoneal fluid cx pending UA: (-)  Goal of Therapy:  Vancomycin trough level 15-20 mcg/ml  Plan:  TBW 89.9kg  IBW 50.1kg  DW 66kg  Vd 46L kei 0.033 hr-1  T1/2 21 hours Vancomycin 1 gram q 24 hours ordered with stacked dosing. Level before 5th dose.  Ceftriaxone 2 grams q 24 hours ordered.   Sim Boast, PharmD, BCPS  05/11/2015

## 2015-05-11 NOTE — Progress Notes (Signed)
Confirmed with patient that family member is supposed to bring Aromasin to the hospital for dispensing.

## 2015-05-12 ENCOUNTER — Inpatient Hospital Stay (HOSPITAL_COMMUNITY)
Admit: 2015-05-12 | Discharge: 2015-05-12 | Disposition: A | Discharge status: Home / Self Care | Payer: Medicare HMO | Attending: Internal Medicine | Admitting: Internal Medicine

## 2015-05-12 DIAGNOSIS — I340 Nonrheumatic mitral (valve) insufficiency: Secondary | ICD-10-CM

## 2015-05-12 LAB — GLUCOSE, CAPILLARY
Glucose-Capillary: 123 mg/dL — ABNORMAL HIGH (ref 65–99)
Glucose-Capillary: 124 mg/dL — ABNORMAL HIGH (ref 65–99)
Glucose-Capillary: 130 mg/dL — ABNORMAL HIGH (ref 65–99)
Glucose-Capillary: 145 mg/dL — ABNORMAL HIGH (ref 65–99)
Glucose-Capillary: 159 mg/dL — ABNORMAL HIGH (ref 65–99)

## 2015-05-12 MED ORDER — METRONIDAZOLE 500 MG PO TABS
500.0000 mg | ORAL_TABLET | Freq: Three times a day (TID) | ORAL | Status: DC
  Administered 2015-05-12 – 2015-05-19 (×23): 500 mg via ORAL
  Filled 2015-05-12 (×23): qty 1

## 2015-05-12 MED ORDER — INSULIN ASPART 100 UNIT/ML ~~LOC~~ SOLN
0.0000 [IU] | Freq: Three times a day (TID) | SUBCUTANEOUS | Status: DC
  Administered 2015-05-12: 1 [IU] via SUBCUTANEOUS
  Administered 2015-05-12: 2 [IU] via SUBCUTANEOUS
  Administered 2015-05-12 – 2015-05-13 (×3): 1 [IU] via SUBCUTANEOUS
  Administered 2015-05-14 (×2): 2 [IU] via SUBCUTANEOUS
  Administered 2015-05-15 – 2015-05-17 (×6): 1 [IU] via SUBCUTANEOUS
  Administered 2015-05-17: 2 [IU] via SUBCUTANEOUS
  Administered 2015-05-17 – 2015-05-18 (×3): 1 [IU] via SUBCUTANEOUS
  Filled 2015-05-12 (×7): qty 1
  Filled 2015-05-12: qty 2
  Filled 2015-05-12 (×5): qty 1
  Filled 2015-05-12: qty 2
  Filled 2015-05-12: qty 1
  Filled 2015-05-12: qty 2

## 2015-05-12 MED ORDER — ZINC OXIDE 40 % EX OINT
TOPICAL_OINTMENT | CUTANEOUS | Status: DC | PRN
Start: 2015-05-12 — End: 2015-05-19
  Filled 2015-05-12: qty 114

## 2015-05-12 MED ORDER — SODIUM CHLORIDE 0.9 % IJ SOLN: 3.0000 mL | INTRAMUSCULAR | Status: DC | PRN

## 2015-05-12 NOTE — Progress Notes (Signed)
Per Dr. Ether Griffins, order patient desitin cream for bottom to apply as often as needed
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
## 2015-05-12 NOTE — Progress Notes (Signed)
PT Cancellation Note  Patient Details Name: Becky Roberts MRN: [id number] DOB: 1932-07-15   Cancelled Treatment:    Reason Eval/Treat Not Completed: Medical issues which prohibited therapy.  Low BP and unable to tolerate today.  Will try again in the AM.    Ramond Dial 05/12/2015, 6:23 PM   Mee Hives, PT MS Acute Rehab Dept. Number: [facility] [id number] and [place] [phone]

## 2015-05-12 NOTE — Progress Notes (Signed)
Per Dr. Ether Griffins, put patient back on precautions as c/diff antigen was positive, even though toxin result was negative.

## 2015-05-12 NOTE — Progress Notes (Signed)
Dr. Ether Griffins notified of patient's BP. Order to hold nadolol and spironolactone - MD to discontinue orders

## 2015-05-12 NOTE — Progress Notes (Signed)
Patient rested quietly all day. Visitors at bedside for the part of the day. Tolerating full liquid diet - no complaints of nausea or pain.

## 2015-05-12 NOTE — Progress Notes (Signed)
Admission skin assessment with Becky Roberts
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
## 2015-05-12 NOTE — Progress Notes (Signed)
Patient active with skilled nursing, social work, occupational therapy, and home health aide through [facility]. Physical therapy closed on 05/03/15. Will need resumption orders at discharge for requested services that are to resume in home.

## 2015-05-12 NOTE — Progress Notes (Signed)
[name] [place] NAME: Becky Roberts    MR#:  [id number]  DATE OF BIRTH:  1931-11-19  SUBJECTIVE:  CHIEF COMPLAINT:   Chief Complaint  Patient presents with  . Nausea  . Emesis  . Diarrhea   Patient is 81 year old Caucasian female with history of liver cirrhosis, ascites, who presents to the hospital with complaints of nausea, vomiting and diarrhea as well as abdominal pain. She underwent peritoneal tap yesterday on admission which revealed the more than 12,000 white blood cells, 88% of them were neutrophils signifying spontaneous bacterial peritonitis, patient was initiated on broad-spectrum antibiotic therapy with Rocephin and vancomycin. Patient admits of nausea, however, denies any more vomiting, admits of diarrheal stool, admits of abdominal pain radiating to from one side of abdomen to other as well as in pain in the lower abdomen, more on the right side. CT of abdomen and pelvis revealed liver cirrhosis, ascites distention of jejunal loops suggested enteritis Blood culture taken on 05/10/2015 showed gram-positive cocci, stool for C. difficile revealed no C. difficile toxin, however, positive for C. difficile antigen. Patient has significant diarrhea with green stool liquidy.  Review of Systems  Constitutional: Positive for chills and malaise/fatigue. Negative for fever and weight loss.  HENT: Negative for congestion.   Eyes: Negative for blurred vision and double vision.  Respiratory: Negative for cough, sputum production, shortness of breath and wheezing.   Cardiovascular: Negative for chest pain, palpitations, orthopnea, leg swelling and PND.  Gastrointestinal: Positive for abdominal pain and diarrhea. Negative for nausea, vomiting, constipation and blood in stool.  Genitourinary: Negative for dysuria, urgency, frequency and hematuria.  Musculoskeletal: Negative for falls.  Neurological: Negative for dizziness, tremors,  focal weakness and headaches.  Endo/Heme/Allergies: Does not bruise/bleed easily.  Psychiatric/Behavioral: Negative for depression. The patient does not have insomnia.     VITAL SIGNS: Blood pressure 90/30, pulse 77, temperature 98.3 F (36.8 C), temperature source Axillary, resp. rate 19, height 5\' 2"  (1.575 m), weight 91.309 kg (201 lb 4.8 oz), SpO2 97 %.  PHYSICAL EXAMINATION:   GENERAL:  81 y.o.-year-old patient lying in the bed with no acute distress.  EYES: Pupils equal, round, reactive to light and accommodation. No scleral icterus. Extraocular muscles intact.  HEENT: Head atraumatic, normocephalic. Oropharynx and nasopharynx clear.  NECK:  Supple, no jugular venous distention. No thyroid enlargement, no tenderness.  LUNGS: Normal breath sounds bilaterally, no wheezing, rales,rhonchi or crepitation. No use of accessory muscles of respiration.  CARDIOVASCULAR: S1, S2 normal. No murmurs, rubs, or gallops.  ABDOMEN: Soft, no discomfort on palpation diffusely. No discrete areas of disc of pain was noted. No rebound or guarding , nondistended. Bowel sounds present. No organomegaly or mass.  EXTREMITIES: No pedal edema, cyanosis, or clubbing.  NEUROLOGIC: Cranial nerves II through XII are intact. Muscle strength 5/5 in all extremities. Sensation intact. Gait not checked.  PSYCHIATRIC: The patient is alert and oriented x 3.  SKIN: No obvious rash, lesion, or ulcer.   ORDERS/RESULTS REVIEWED:   CBC  Recent Labs Lab 05/10/15 1524 05/11/15 0426  WBC 4.7 9.4  HGB 13.4 10.9*  HCT 40.2 32.8*  PLT 142* 98*  MCV 96.2 96.2  MCH 32.1 32.0  MCHC 33.4 33.3  RDW 15.9* 16.0*   ------------------------------------------------------------------------------------------------------------------  Chemistries   Recent Labs Lab 05/10/15 1524 05/11/15 0426  NA 141 142  K 3.3* 3.2*  CL 105 109  CO2 28 25  GLUCOSE 122* 102*  BUN 9 13  CREATININE 1.35* 1.69*  CALCIUM 8.1* 7.5*  MG  --   1.3*  AST 46*  --   ALT 17  --   ALKPHOS 80  --   BILITOT 2.0*  --    ------------------------------------------------------------------------------------------------------------------ estimated creatinine clearance is 27.4 mL/min (by C-G formula based on Cr of 1.69). ------------------------------------------------------------------------------------------------------------------ No results for input(s): TSH, T4TOTAL, T3FREE, THYROIDAB in the last 72 hours.  Invalid input(s): FREET3  Cardiac Enzymes  Recent Labs Lab 05/10/15 1554  TROPONINI <0.03   ------------------------------------------------------------------------------------------------------------------ Invalid input(s): POCBNP ---------------------------------------------------------------------------------------------------------------  RADIOLOGY: Ct Abdomen Pelvis Wo Contrast  05/10/2015   CLINICAL DATA:  Nausea, vomiting, diarrhea  EXAM: CT ABDOMEN AND PELVIS WITHOUT CONTRAST  TECHNIQUE: Multidetector CT imaging of the abdomen and pelvis was performed following the standard protocol without IV contrast.  COMPARISON:  Seven/ 14/16  FINDINGS: Sagittal images of the spine shows again degenerative changes in lower thoracic spine. Degenerative changes lumbar spine L4-L5 level. Study is limited without IV contrast. Bilateral small pleural effusion with bilateral lower lobe atelectasis. Cirrhosis of the liver again noted. There is abundant perihepatic and perisplenic ascites. Abundant ascites noted bilateral paracolic gutters left greater than right. Multiple calcified gallstones are again noted within gallbladder. Atherosclerotic calcifications of abdominal aorta and iliac arteries.  No aortic aneurysm. Moderate distended proximal small bowel loops probable due to enteritis especially and in jejunum. There is no definite evidence of small bowel obstruction. Distal small bowel is smaller caliber probable empty partially collapsed.   Unenhanced uterus and ovaries unremarkable. The terminal ileum is unremarkable.  Paraesophageal varices again noted. Oral contrast material was given to the patient. No evidence of gastric outlet obstruction.  Unenhanced kidney shows no nephrolithiasis. No hydronephrosis or hydroureter. Unenhanced pancreas, spleen and adrenal glands are unremarkable.  IMPRESSION: 1. Again noted cirrhosis of the liver. Again noted abdominal and pelvic ascites. 2. There are moderate distended jejunal loops probable due to enteritis or ileus. No definite evidence of small bowel obstruction. 3. No hydronephrosis or hydroureter is. Again noted paraesophageal varices. 4. Bilateral small pleural effusion with bilateral lower lobe posterior atelectasis. 5. No evidence of gastric outlet obstruction.   Electronically Signed   By: Lahoma Crocker M.D.   On: 05/10/2015 19:53    EKG:  Orders placed or performed during the hospital encounter of 05/10/15  . EKG 12-Lead  . 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 Spontaneous bacterial peritonitis, continue broad-spectrum antibiotic therapy, peritoneal fluid cultures are negative so far 2. Sepsis due to gram positive cocci , continue vancomycin, get repeated blood cultures taken today. Get echocardiogram and infectious diseases consultation . Patient is stable clinically 3. Chronic renal failure. Follow with therapy, UA was unremarkable, not suggestive of urinary tract infection, repeat BMP tomorrow morning 4. Hypokalemia, supplemented orally. Low magnesium level, supplemented IV 5. Ascites. Hold spironolactone due to significant diarrhea 6. Acute gastroenteritis, likely C.  difficile infection, continue clear liquid diet. Initiate patient on Flagyl orally, following stool frequency and hydration status Management plans discussed with the patient, family and they are in agreement.   DRUG ALLERGIES:  Allergies  Allergen Reactions  . Sulfa Antibiotics Hives  . Tylenol [Acetaminophen] Other (See  Comments)    Has cirrhosis of the liver secondary to Tylenol use.  . Vioxx [Rofecoxib] Hives and Itching  . Aspirin Palpitations    CODE STATUS:     Code Status Orders        Start     Ordered   05/10/15 2254  Do not attempt resuscitation (DNR)   Continuous    Question Answer Comment  In the event of cardiac or respiratory ARREST Do not call a "code blue"   In the event of cardiac or respiratory ARREST Do not perform Intubation, CPR, defibrillation or ACLS   In the event of cardiac or respiratory ARREST Use medication by any route, position, wound care, and other measures to relive pain and suffering. May use oxygen, suction and manual treatment of airway obstruction as needed for comfort.      05/10/15 2253      TOTAL TIME TAKING CARE OF THIS PATIENT: 40 minutes.    Theodoro Grist M.D on 05/12/2015 at 12:53 PM  Between 7am to 6pm - Pager - [phone]  After 6pm go to [email or web address] - password [email or web address] [facility]  [facility]  Office  [phone]  CC: Primary care physician; No primary care provider on file.

## 2015-05-12 NOTE — Progress Notes (Signed)
*  PRELIMINARY RESULTS* Echocardiogram 2D Echocardiogram has been performed.  Laqueta Jean Hege 05/12/2015, 8:29 AM

## 2015-05-13 LAB — CBC
HCT: 28.8 % — ABNORMAL LOW (ref 35.0–47.0)
Hemoglobin: 9.5 g/dL — ABNORMAL LOW (ref 12.0–16.0)
MCH: 31.7 pg (ref 26.0–34.0)
MCHC: 32.8 g/dL (ref 32.0–36.0)
MCV: 96.6 fL (ref 80.0–100.0)
PLATELETS: 82 10*3/uL — AB (ref 150–440)
RBC: 2.98 MIL/uL — AB (ref 3.80–5.20)
RDW: 16.4 % — AB (ref 11.5–14.5)
WBC: 8.1 10*3/uL (ref 3.6–11.0)

## 2015-05-13 LAB — BASIC METABOLIC PANEL
Anion gap: 7 (ref 5–15)
Anion gap: 4 — ABNORMAL LOW (ref 5–15)
BUN: 28 mg/dL — ABNORMAL HIGH (ref 6–20)
BUN: 29 mg/dL — AB (ref 6–20)
CALCIUM: 7.4 mg/dL — AB (ref 8.9–10.3)
CALCIUM: 7.6 mg/dL — AB (ref 8.9–10.3)
CHLORIDE: 106 mmol/L (ref 101–111)
CO2: 26 mmol/L (ref 22–32)
CO2: 26 mmol/L (ref 22–32)
Chloride: 107 mmol/L (ref 101–111)
Creatinine, Ser: 3.15 mg/dL — ABNORMAL HIGH (ref 0.44–1.00)
Creatinine, Ser: 3.02 mg/dL — ABNORMAL HIGH (ref 0.44–1.00)
GFR calc Af Amer: 15 mL/min — ABNORMAL LOW (ref >60)
GFR calc non Af Amer: 13 mL/min — ABNORMAL LOW (ref >60)
GFR, EST AFRICAN AMERICAN: 16 mL/min — AB (ref >60)
GFR, EST NON AFRICAN AMERICAN: 14 mL/min — AB (ref >60)
Glucose, Bld: 93 mg/dL (ref 65–99)
Glucose, Bld: 117 mg/dL — ABNORMAL HIGH (ref 65–99)
Potassium: 3.6 mmol/L (ref 3.5–5.1)
Potassium: 3.9 mmol/L (ref 3.5–5.1)
SODIUM: 139 mmol/L (ref 135–145)
Sodium: 137 mmol/L (ref 135–145)

## 2015-05-13 LAB — GLUCOSE, CAPILLARY
GLUCOSE-CAPILLARY: 96 mg/dL (ref 65–99)
GLUCOSE-CAPILLARY: 110 mg/dL — AB (ref 65–99)
Glucose-Capillary: 146 mg/dL — ABNORMAL HIGH (ref 65–99)
Glucose-Capillary: 135 mg/dL — ABNORMAL HIGH (ref 65–99)

## 2015-05-13 LAB — CULTURE, BLOOD (ROUTINE X 2)

## 2015-05-13 LAB — MAGNESIUM: Magnesium: 1.4 mg/dL — ABNORMAL LOW (ref 1.7–2.4)

## 2015-05-13 MED ORDER — ALBUMIN HUMAN 25 % IV SOLN
12.5000 g | Freq: Two times a day (BID) | INTRAVENOUS | Status: DC
  Administered 2015-05-13 – 2015-05-17 (×8): 12.5 g via INTRAVENOUS
  Filled 2015-05-13 (×17): qty 50

## 2015-05-13 MED ORDER — MAGNESIUM SULFATE 4 GM/100ML IV SOLN
4.0000 g | Freq: Once | INTRAVENOUS | Status: AC
  Administered 2015-05-13: 4 g via INTRAVENOUS
  Filled 2015-05-13: qty 100

## 2015-05-13 MED ORDER — SODIUM CHLORIDE 0.9 % IV SOLN
INTRAVENOUS | Status: DC
  Administered 2015-05-13: 14:00:00 via INTRAVENOUS

## 2015-05-13 NOTE — Evaluation (Signed)
Physical Therapy Evaluation Patient Details Name: Becky Roberts MRN: [id number] DOB: [birth year] Today's Date: 05/13/2015   History of Present Illness  Pt was admitted to hospital with vommiting, signs of tachypnea, and was febrile.  Pt was diagnosed with sepsis secondary to spontaneous bacterial perionitis/gall stones/gallbladder disease.     Clinical Impression  Pt was compliant with PT and reports no pain with any movements today.  Pt was able to transfer from supine to sitting with supervision assist with reported dizziness (BP at 105/67 in sitting).  Pt UE/LE MMT was [facility] with exception to R hip flex (3+/5) and was able to stand with min assist using a RW.  With first standing balance attempt, pt could only tolerate 2 min secondary to orthostatics (BP at 88/50) so she had to return to sitting.  After 5 min, BP returned to 124/52 and pt was able to stand and ambulate to her recliner chair with min assist using a RW (3 ft).  Pt would benefit from skilled PT in order to increase cardiorespiratory endurance, increase gross strength, and improve with transfers.       Follow Up Recommendations SNF    Equipment Recommendations  Rolling walker with 5" wheels    Recommendations for Other Services       Precautions / Restrictions Precautions Precautions: Fall Restrictions Weight Bearing Restrictions: No      Mobility  Bed Mobility Overal bed mobility: Needs Assistance Bed Mobility: Sidelying to Sit   Sidelying to sit: Supervision       General bed mobility comments: Takes her a while to transfer (~47min) but is able to do it without assist from PT, requires supervision.  Transfers Overall transfer level: Needs assistance Equipment used: Rolling walker (2 wheeled) Transfers: Sit to/from Stand Sit to Stand: Min assist         General transfer comment: Pt is able to stand demonstrating ample stability and strength, orthostatics inhibiting her more than anything (see  vitals)  Ambulation/Gait Ambulation/Gait assistance: Min assist Ambulation Distance (Feet): 3 Feet Assistive device: Rolling walker (2 wheeled) Gait Pattern/deviations: Step-to pattern;Decreased step length - left;Decreased step length - right;Decreased stride length;Shuffle;Trunk flexed;Narrow base of support   Gait velocity interpretation: <1.8 ft/sec, indicative of risk for recurrent falls    Stairs            Wheelchair Mobility    Modified Rankin (Stroke Patients Only)       Balance Overall balance assessment: Needs assistance Sitting-balance support: Single extremity supported;Bilateral upper extremity supported;No upper extremity supported Sitting balance-Leahy Scale: Fair Sitting balance - Comments: Pt at rest tends to use bilat UE support, but is able to balance in sitting with no UE support as seen when she was donning her gown. Orthostatics cause her head to "feel swimmy," but normalizes after ~10min.     Standing balance-Leahy Scale: Fair Standing balance comment: Pt was able to stand with good stability with min assist using a RW for ~72min but reported that she need to sit down seecondary to dizziness, BP measured at 88/50 with standing.  After 49min, pt was able to stand up again and transfer to the recliner chair with min assist using a RW.                              Pertinent Vitals/Pain Pain Assessment: No/denies pain    Home Living Family/patient expects to be discharged to:: Private residence Living Arrangements: Children Available Help  at Discharge: Family (Son ) Type of Home: House (Returning to [employer], otherwise lives in mobile home) Home Access: Stairs to enter Entrance Stairs-Rails: Can reach both (Son and grandson help her ascend/descend stairs. ) Technical brewer of Steps: 4 Home Layout: One level Home Equipment: Environmental consultant - 2 wheels;Shower seat      Prior Function Level of Independence: Needs assistance   Gait /  Transfers Assistance Needed: Pt requires son or grandson for short ambulation distances within the home.  ADL's / Homemaking Assistance Needed: Assistance from [place], [place], and daughter-in-law.         Hand Dominance        Extremity/Trunk Assessment   Upper Extremity Assessment: Overall WFL for tasks assessed           Lower Extremity Assessment: Overall WFL for tasks assessed (Pt was at least 4/5 with bilat LE with exception to R hip flex measured at 3+/5)      Cervical / Trunk Assessment: Normal  Communication   Communication: No difficulties  Cognition Arousal/Alertness: Awake/alert Behavior During Therapy: WFL for tasks assessed/performed Overall Cognitive Status: Within Functional Limits for tasks assessed                      General Comments      Exercises        Assessment/Plan    PT Assessment Patient needs continued PT services  PT Diagnosis Difficulty walking;Abnormality of gait;Generalized weakness   PT Problem List Decreased strength;Decreased range of motion;Decreased activity tolerance;Decreased balance;Decreased mobility;Decreased coordination;Decreased knowledge of use of DME;Decreased safety awareness;Cardiopulmonary status limiting activity  PT Treatment Interventions DME instruction;Gait training;Stair training;Functional mobility training;Therapeutic activities;Therapeutic exercise;Balance training;Patient/family education   PT Goals (Current goals can be found in the Care Plan section) Acute Rehab PT Goals Patient Stated Goal: to leave the hospital and return home PT Goal Formulation: With patient Time For Goal Achievement: 05/27/15 Potential to Achieve Goals: Fair    Frequency Min 2X/week   Barriers to discharge Inaccessible home environment 4 steps    Co-evaluation               End of Session Equipment Utilized During Treatment: Gait belt;Oxygen Activity Tolerance: Patient tolerated treatment well Patient left:  in chair;with call bell/phone within reach;with chair alarm set Nurse Communication: Mobility status         Time: 3646-8032 PT Time Calculation (min) (ACUTE ONLY): 34 min   Charges:         PT G Codes:        Bernestine Amass, SPT 05/13/2015 10:38 AM

## 2015-05-13 NOTE — Consult Note (Signed)
Date: 05/13/2015                  Patient Name:  Becky Roberts  MRN: [id number]  DOB: 06-Feb-1932  Age / Sex: 81 y.o., female         PCP: No primary care provider on file.                 Service Requesting Consult: Internal medicine / Dr Ether Griffins                 Reason for Consult: ARF            History of Present Illness: Patient is a 81 y.o. female with medical problems of nonalcoholic liver cirrhosis, ascites, diabetes, chronic kidney disease who was admitted to [facility] on 05/10/2015 for evaluation of nausea, vomiting, diarrhea and abdominal pain.  She underwent ascitic fluid tap on August 9 which showed WBC of 12,000, 88% neutrophils. She is diagnosed with SBP. Today, it was noticed that her creatinine has increased to 3.15. Her presenting creatinine was 1.69 which appears to be her baseline. She was referred to our office in 2012 for CKD. Her baseline creatinine was 1.6 at that time. She did not come for evaluation at that time.  At present, her mouth appears dry. She does not report any shortness of breath. She does have lower extremity swelling. She is currently getting IV normal saline at 75 cc per hour.   Medications: Outpatient medications: Prescriptions prior to admission  Medication Sig Dispense Refill Last Dose  . albuterol (PROVENTIL HFA;VENTOLIN HFA) 108 (90 BASE) MCG/ACT inhaler Inhale 2 puffs into the lungs every 6 (six) hours as needed for wheezing. 1 Inhaler 11 prn  . ALPRAZolam (XANAX) 0.25 MG tablet Take 1 tablet (0.25 mg total) by mouth at bedtime as needed for anxiety. 15 tablet 0 prn  . Calcium Carbonate-Vitamin D (CALTRATE 600+D PO) Take 2 tablets by mouth daily.   Unknown  . chlorhexidine (PERIDEX) 0.12 % solution Use as directed 15 mLs in the mouth or throat 2 (two) times daily. 120 mL 2 Unknown  . ciprofloxacin (CIPRO) 250 MG tablet Take 1 tablet (250 mg total) by mouth 2 (two) times daily. 20 tablet 0 Unknown  . cyanocobalamin (,VITAMIN B-12,) 1000  MCG/ML injection Inject 1 mL (1,000 mcg total) into the muscle every 30 (thirty) days. (Patient taking differently: Inject 1,000 mcg into the muscle every 30 (thirty) days. Pt uses on the 28th of every month.) 10 mL 12 04/28/2015  . diazepam (VALIUM) 2 MG tablet Take 2 mg by mouth 3 (three) times daily as needed for anxiety.   prn  . exemestane (AROMASIN) 25 MG tablet Take 25 mg by mouth daily.    Unknown  . furosemide (LASIX) 20 MG tablet Take 1 tablet (20 mg total) by mouth daily. 30 tablet 0 Unknown  . insulin aspart protamine- aspart (NOVOLOG MIX 70/30) (70-30) 100 UNIT/ML injection Inject 38-40 Units into the skin 2 (two) times daily with a meal. Pt uses 38 units with breakfast and 40 units with dinner.   Unknown  . insulin detemir (LEVEMIR) 100 UNIT/ML injection 16 to 20 units daily (Patient taking differently: Inject 16-20 Units into the skin 3 (three) times daily as needed (for high blood sugar). Pt uses as needed per sliding scale.) 15 mL 12 Unknown  . losartan (COZAAR) 100 MG tablet Take 1 tablet (100 mg total) by mouth daily. 90 tablet 3 Unknown  . metoCLOPramide (REGLAN) 10  MG tablet Take 10 mg by mouth 3 (three) times daily before meals.    Unknown  . metoprolol tartrate (LOPRESSOR) 25 MG tablet TAKE ONE TABLET TWICE DAILY (Patient taking differently: Take 25 mg by mouth 2 (two) times daily. ) 180 tablet 3 Unknown  . nadolol (CORGARD) 20 MG tablet Take 1 tablet (20 mg total) by mouth daily. 30 tablet 0 Unknown  . nystatin (MYCOSTATIN) 100000 UNIT/ML suspension Use as directed 5 mLs (500,000 Units total) in the mouth or throat 4 (four) times daily. 120 mL 0 Unknown  . omeprazole (PRILOSEC) 20 MG capsule Take 20 mg by mouth daily.   Unknown  . oxyCODONE (OXY IR/ROXICODONE) 5 MG immediate release tablet Take 1 tablet (5 mg total) by mouth every 8 (eight) hours as needed for moderate pain. 30 tablet 0 prn  . rOPINIRole (REQUIP) 0.5 MG tablet Take 1 mg by mouth at bedtime.   Unknown  .  simvastatin (ZOCOR) 40 MG tablet Take 40 mg by mouth at bedtime.   Unknown  . Vitamin D, Ergocalciferol, (DRISDOL) 50000 UNITS CAPS capsule Take 50,000 Units by mouth every 7 (seven) days. Pt takes on Monday.   05/09/2015    Current medications: Current Facility-Administered Medications  Medication Dose Route Frequency Provider Last Rate Last Dose  . 0.9 %  sodium chloride infusion   Intravenous Continuous Theodoro Grist, MD 75 mL/hr at 05/13/15 1355    . albuterol (PROVENTIL) (2.5 MG/3ML) 0.083% nebulizer solution 3 mL  3 mL Inhalation Q6H PRN Lance Coon, MD      . antiseptic oral rinse (CPC / CETYLPYRIDINIUM CHLORIDE 0.05%) solution 7 mL  7 mL Mouth Rinse BID Lance Coon, MD   7 mL at 05/13/15 1000  . cefTRIAXone (ROCEPHIN) 2 g in dextrose 5 % 50 mL IVPB  2 g Intravenous Q24H Hinda Kehr, MD 100 mL/hr at 05/12/15 1854 2 g at 05/12/15 1854  . diazepam (VALIUM) tablet 2 mg  2 mg Oral TID PRN Lance Coon, MD   2 mg at 05/12/15 2215  . exemestane (AROMASIN) tablet 25 mg  25 mg Oral Daily Lance Coon, MD   25 mg at 05/13/15 7510  . insulin aspart (novoLOG) injection 0-9 Units  0-9 Units Subcutaneous TID AC & HS Theodoro Grist, MD   1 Units at 05/13/15 1229  . liver oil-zinc oxide (DESITIN) 40 % ointment   Topical PRN Theodoro Grist, MD      . metoCLOPramide (REGLAN) tablet 10 mg  10 mg Oral TID [facility] Lance Coon, MD   10 mg at 05/13/15 1228  . metroNIDAZOLE (FLAGYL) tablet 500 mg  500 mg Oral 3 times per day Theodoro Grist, MD   500 mg at 05/13/15 1228  . morphine 2 MG/ML injection 2 mg  2 mg Intravenous Q4H PRN Lance Coon, MD      . ondansetron [facility]) tablet 4 mg  4 mg Oral Q6H PRN Lance Coon, MD       Or  . ondansetron [facility]) injection 4 mg  4 mg Intravenous Q6H PRN Lance Coon, MD      . oxyCODONE (Oxy IR/ROXICODONE) immediate release tablet 5 mg  5 mg Oral Q8H PRN Lance Coon, MD   5 mg at 05/11/15 1715  . pantoprazole (PROTONIX) EC tablet 40 mg  40 mg Oral Daily Lance Coon, MD   40  mg at 05/13/15 0921  . rOPINIRole (REQUIP) tablet 1 mg  1 mg Oral QHS Lance Coon, MD   1 mg at  05/12/15 2215  . simvastatin (ZOCOR) tablet 40 mg  40 mg Oral QHS Lance Coon, MD   40 mg at 05/12/15 2215  . sodium chloride 0.9 % injection 3 mL  3 mL Intravenous PRN Theodoro Grist, MD          Allergies: Allergies  Allergen Reactions  . Sulfa Antibiotics Hives  . Tylenol [Acetaminophen] Other (See Comments)    Has cirrhosis of the liver secondary to Tylenol use.  . Vioxx [Rofecoxib] Hives and Itching  . Aspirin Palpitations      Past Medical History: Past Medical History  Diagnosis Date  . Blood transfusion reaction   . Diabetes mellitus   . Depression   . Hypertension   . Anemia 2012    hgb 5.5, admitted,  workup multifactorial,  b12 deficiency  . Arthritis   . Asthma   . Portal hypertension February 2014    CT suggested a small nodular liver, multiple varices adjacent to the spleen and inferior mediastinum and paraesophageal region. No ascites.  . Breast cancer July 2,2013    T1c,N0,M0. ER + PR- , HER-2/neu not overexpressing..  . Cirrhosis   . Cholelithiases   . CKD (chronic kidney disease) stage 3, GFR 30-59 ml/min      Past Surgical History: Past Surgical History  Procedure Laterality Date  . Breast biopsy  July 2013    [name]: wide excision   . Colonoscopy  2012     Family History: Family History  Problem Relation Age of Onset  . Arthritis Mother   . Hypertension Mother   . Diabetes Mother   . Arthritis Father   . Hypertension Father   . Diabetes Father   . Arthritis Other   . Cancer Other     breast/lung     Social History: Social History   Social History  . Marital Status: Married    Spouse Name: N/A  . Number of Children: N/A  . Years of Education: N/A   Occupational History  . Not on file.   Social History Main Topics  . Smoking status: Former Research scientist (life sciences)  . Smokeless tobacco: Never Used  . Alcohol Use: No  . Drug Use: No  . Sexual  Activity: No   Other Topics Concern  . Not on file   Social History Narrative   Son Wille Glaser      Review of Systems: Gen: No weight loss, no unusual headaches HEENT: No visual problems reported CV: States that she was told she has strong heart for her age Resp: No breathing troubles reported GI: As in history of present illness. Cirrhosis, nausea and diarrhea recently. Denies blood in the stool GU : No hematuria or any other renal issues MS: Uses wheelchairs to get around Derm:  No skin complaints Psych: No complaints Heme: No complaints  Neuro: generalized weakness Endocrine patient is diabetic:   Vital Signs: Blood pressure 101/40, pulse 92, temperature 98.1 F (36.7 C), temperature source Oral, resp. rate 16, height _0  (1.575 m), weight 91.989 kg (202 lb 12.8 oz), SpO2 93 %.   Intake/Output Summary (Last 24 hours) at 05/13/15 1525 Last data filed at 05/13/15 0830  Gross per 24 hour  Intake      0 ml  Output      0 ml  Net      0 ml    Weight trends: Filed Weights   05/11/15 0500 05/12/15 0533 05/13/15 0520  Weight: 89.812 kg (198 lb) 91.309 kg (201 lb 4.8 oz)  91.989 kg (202 lb 12.8 oz)    Physical Exam: General:  Chronically ill-appearing, disheveled, in no acute distress   HEENT Anicteric sclera, pupils are round, dry oral mucous membranes   Neck:  Supple, no masses   Lungs: Normal effort, no wheezing or crackles   Heart::  Heart regular rhythm with ectopic beats, no rub or gallop   Abdomen: Soft, mild tenderness, distended from ascites, obese, bowel sounds present   Extremities:  2+ peripheral pitting edema   Neurologic: Alert, oriented, speech normal   Skin: No acute rashes              Lab results: Basic Metabolic Panel:  Recent Labs Lab 05/10/15 1524 05/11/15 0426 05/13/15 0724  NA 141 142 139  K 3.3* 3.2* 3.6  CL 105 109 106  CO2 _0 GLUCOSE 122* 102* 93  BUN 9 13 28*  CREATININE 1.35* 1.69* 3.15*  CALCIUM 8.1* 7.5* 7.4*  MG  --   1.3* 1.4*    Liver Function Tests:  Recent Labs Lab 05/10/15 1524  AST 46*  ALT 17  ALKPHOS 80  BILITOT 2.0*  PROT 5.3*  ALBUMIN 2.1*    Recent Labs Lab 05/10/15 1554  LIPASE 34   No results for input(s): AMMONIA in the last 168 hours.  CBC:  Recent Labs Lab 05/11/15 0426 05/13/15 0724  WBC 9.4 8.1  HGB 10.9* 9.5*  HCT 32.8* 28.8*  MCV 96.2 96.6  PLT 98* 82*    Cardiac Enzymes:  Recent Labs Lab 05/10/15 1554  TROPONINI <0.03    BNP: Invalid input(s): POCBNP  CBG:  Recent Labs Lab 05/12/15 1144 05/12/15 1635 05/12/15 2057 05/13/15 0756 05/13/15 1157  GLUCAP 159* 145* 124* 96 146*    Microbiology: Recent Results (from the past 720 hour(s))  Urine culture     Status: None   Collection Time: 04/14/15  1:50 PM  Result Value Ref Range Status   Specimen Description URINE, CATHETERIZED  Final   Special Requests NONE  Final   Culture >=100,000 COLONIES/mL GRAM POSITIVE RODS  Final   Report Status 04/16/2015 FINAL  Final  C difficile quick scan w PCR reflex ([facility] only)     Status: None   Collection Time: 04/14/15 10:04 PM  Result Value Ref Range Status   C Diff antigen NEGATIVE  Final   C Diff toxin NEGATIVE  Final   C Diff interpretation Negative for C. difficile  Final  Body fluid culture     Status: None   Collection Time: 04/15/15  3:11 PM  Result Value Ref Range Status   Specimen Description PERITONEAL  Final   Special Requests NONE  Final   Gram Stain MODERATE WBC SEEN NO ORGANISMS SEEN   Final   Culture NO GROWTH 3 DAYS  Final   Report Status 04/18/2015 FINAL  Final  Body fluid culture     Status: None (Preliminary result)   Collection Time: 05/10/15  6:30 PM  Result Value Ref Range Status   Specimen Description PERITONEAL  Final   Special Requests Normal  Final   Gram Stain MANY WBC SEEN NO ORGANISMS SEEN   Final   Culture NO GROWTH 3 DAYS  Final   Report Status PENDING  Incomplete  Blood culture (routine x 2)     Status:  None   Collection Time: 05/10/15  6:58 PM  Result Value Ref Range Status   Specimen Description BLOOD LEFT ANTECUBITAL  Final   Special Requests BOTTLES  DRAWN AEROBIC AND ANAEROBIC 5ML  Final   Culture  Setup Time   Final    GRAM POSITIVE COCCI IN BOTH AEROBIC AND ANAEROBIC BOTTLES PREVIOUSLY CALLED TO DOLL FERGUSON AT 0354 05/11/15 BY TB CONFIRMED BY TB    Culture   Final    STAPHYLOCOCCUS EPIDERMIDIS IN BOTH AEROBIC AND ANAEROBIC BOTTLES WARNING: For oxacillin-resistant S.aureus and coagulase-negative staphylococci (MRS), other beta-lactam agents, ie, penicillins, beta-lactam/beta-lactamase inhibitor combinations, cephems (with the exception of the cephalosporins with anti-MRSA  activity), and carbapenems, may appear active in vitro, but are not effective clinically.  --CLSI, Vol.32 No.3, January 2012, pg 70.    Report Status 05/13/2015 FINAL  Final   Organism ID, Bacteria STAPHYLOCOCCUS EPIDERMIDIS  Final      Susceptibility   Staphylococcus epidermidis - MIC*    CIPROFLOXACIN >=8 RESISTANT Resistant     ERYTHROMYCIN >=8 RESISTANT Resistant     GENTAMICIN <=0.5 SENSITIVE Sensitive     OXACILLIN Value in next row Resistant      >=4 RESISTANTWARNING: For oxacillin-resistant S.aureus and coagulase-negative staphylococci (MRS), other beta-lactam agents, ie, penicillins, beta-lactam/beta-lactamase inhibitor combinations, cephems (with the exception of the cephalosporins with anti-MRSA activity), and carbapenems, may appear active in vitro, but are not effective clinically.  --CLSI, Vol.32 No.3, January 2012, pg 70.    TETRACYCLINE Value in next row Sensitive      >=4 RESISTANTWARNING: For oxacillin-resistant S.aureus and coagulase-negative staphylococci (MRS), other beta-lactam agents, ie, penicillins, beta-lactam/beta-lactamase inhibitor combinations, cephems (with the exception of the cephalosporins with anti-MRSA activity), and carbapenems, may appear active in vitro, but are not effective  clinically.  --CLSI, Vol.32 No.3, January 2012, pg 70.    VANCOMYCIN Value in next row Sensitive      >=4 RESISTANTWARNING: For oxacillin-resistant S.aureus and coagulase-negative staphylococci (MRS), other beta-lactam agents, ie, penicillins, beta-lactam/beta-lactamase inhibitor combinations, cephems (with the exception of the cephalosporins with anti-MRSA activity), and carbapenems, may appear active in vitro, but are not effective clinically.  --CLSI, Vol.32 No.3, January 2012, pg 70.    CLINDAMYCIN Value in next row Sensitive      >=4 RESISTANTWARNING: For oxacillin-resistant S.aureus and coagulase-negative staphylococci (MRS), other beta-lactam agents, ie, penicillins, beta-lactam/beta-lactamase inhibitor combinations, cephems (with the exception of the cephalosporins with anti-MRSA activity), and carbapenems, may appear active in vitro, but are not effective clinically.  --CLSI, Vol.32 No.3, January 2012, pg 70.    LINEZOLID Value in next row Sensitive      SENSITIVE2    CEFAZOLIN Value in next row Resistant      SENSITIVE2    * STAPHYLOCOCCUS EPIDERMIDIS  Blood culture (routine x 2)     Status: None (Preliminary result)   Collection Time: 05/10/15  6:58 PM  Result Value Ref Range Status   Specimen Description BLOOD LEFT WRIST  Final   Special Requests BOTTLES DRAWN AEROBIC AND ANAEROBIC 2ML  Final   Culture  Setup Time   Final    GRAM POSITIVE COCCI ANAEROBIC BOTTLE ONLY CRITICAL RESULT CALLED TO, READ BACK BY AND VERIFIED WITH: DOLL FERGUSON ON 05/11/15 AT 85 PM BY TB. CONFIRMED BY TB/SDR    Culture   Final    STAPHYLOCOCCUS EPIDERMIDIS WARNING: For oxacillin-resistant S.aureus and coagulase-negative staphylococci (MRS), other beta-lactam agents, ie, penicillins, beta-lactam/beta-lactamase inhibitor combinations, cephems (with the exception of the cephalosporins with anti-MRSA  activity), and carbapenems, may appear active in vitro, but are not effective clinically.  --CLSI, Vol.32  No.3, January 2012, pg 70.    Report Status PENDING  Incomplete  Organism ID, Bacteria STAPHYLOCOCCUS EPIDERMIDIS  Final      Susceptibility   Staphylococcus epidermidis - MIC*    CIPROFLOXACIN >=8 RESISTANT Resistant     ERYTHROMYCIN >=8 RESISTANT Resistant     GENTAMICIN >=16 RESISTANT Resistant     OXACILLIN Value in next row Resistant      >=4 RESISTANTWARNING: For oxacillin-resistant S.aureus and coagulase-negative staphylococci (MRS), other beta-lactam agents, ie, penicillins, beta-lactam/beta-lactamase inhibitor combinations, cephems (with the exception of the cephalosporins with anti-MRSA activity), and carbapenems, may appear active in vitro, but are not effective clinically.  --CLSI, Vol.32 No.3, January 2012, pg 70.    TETRACYCLINE Value in next row Resistant      >=4 RESISTANTWARNING: For oxacillin-resistant S.aureus and coagulase-negative staphylococci (MRS), other beta-lactam agents, ie, penicillins, beta-lactam/beta-lactamase inhibitor combinations, cephems (with the exception of the cephalosporins with anti-MRSA activity), and carbapenems, may appear active in vitro, but are not effective clinically.  --CLSI, Vol.32 No.3, January 2012, pg 70.    VANCOMYCIN Value in next row Sensitive      >=4 RESISTANTWARNING: For oxacillin-resistant S.aureus and coagulase-negative staphylococci (MRS), other beta-lactam agents, ie, penicillins, beta-lactam/beta-lactamase inhibitor combinations, cephems (with the exception of the cephalosporins with anti-MRSA activity), and carbapenems, may appear active in vitro, but are not effective clinically.  --CLSI, Vol.32 No.3, January 2012, pg 70.    CLINDAMYCIN Value in next row Resistant      >=4 RESISTANTWARNING: For oxacillin-resistant S.aureus and coagulase-negative staphylococci (MRS), other beta-lactam agents, ie, penicillins, beta-lactam/beta-lactamase inhibitor combinations, cephems (with the exception of the cephalosporins with anti-MRSA activity),  and carbapenems, may appear active in vitro, but are not effective clinically.  --CLSI, Vol.32 No.3, January 2012, pg 70.    LINEZOLID Value in next row Sensitive      SENSITIVE1    CEFAZOLIN Value in next row Resistant      SENSITIVE1    LEVOFLOXACIN Value in next row Resistant      RESISTANT>=8    * STAPHYLOCOCCUS EPIDERMIDIS  C difficile quick scan w PCR reflex     Status: Abnormal   Collection Time: 05/11/15  6:35 PM  Result Value Ref Range Status   C Diff antigen POSITIVE (A) NEGATIVE Final   C Diff toxin NEGATIVE NEGATIVE Final   C Diff interpretation   Final    Negative for toxigenic C. difficile. Toxin gene and active toxin production not detected. May be a nontoxigenic strain of C. difficile bacteria present, lacking the ability to produce toxin.  Clostridium Difficile by PCR     Status: None   Collection Time: 05/11/15  6:35 PM  Result Value Ref Range Status   Toxigenic C Difficile by pcr NEGATIVE NEGATIVE Final  Culture, blood (routine x 2)     Status: None (Preliminary result)   Collection Time: 05/12/15  9:35 AM  Result Value Ref Range Status   Specimen Description BLOOD LEFT WRIST  Final   Special Requests BOTTLES DRAWN AEROBIC AND ANAEROBIC  3 CC  Final   Culture NO GROWTH < 24 HOURS  Final   Report Status PENDING  Incomplete  Culture, blood (routine x 2)     Status: None (Preliminary result)   Collection Time: 05/12/15  9:35 AM  Result Value Ref Range Status   Specimen Description BLOOD LEFT ASSIST CONTROL  Final   Special Requests BOTTLES DRAWN AEROBIC AND ANAEROBIC  5 CC  Final   Culture NO GROWTH < 24 HOURS  Final   Report Status PENDING  Incomplete  Coagulation Studies: No results for input(s): LABPROT, INR in the last 72 hours.  Urinalysis:  Recent Labs  05/10/15 1729  COLORURINE YELLOW*  LABSPEC 1.013  PHURINE 6.0  GLUCOSEU NEGATIVE  HGBUR NEGATIVE  BILIRUBINUR NEGATIVE  KETONESUR NEGATIVE  PROTEINUR NEGATIVE  NITRITE NEGATIVE  LEUKOCYTESUR  NEGATIVE      Imaging:  No results found.   Assessment & Plan: Pt is a 81 y.o. yo female with a PMHX of diabetes type 2, nonalcoholic liver cirrhosis, ascites, chronic kidney disease , was admitted on 05/10/2015 with nausea, diarrhea.She is diagnosed with SBP. Her blood culture is growing coag-negative staph which is resistant to multiple antibiotics . C. difficile antigen is positive but C. difficile toxin is negative   1.Acute renal failure. Likely ATN. Secondary to concurrent illness. 2. Spontaneous bacterial peritonitis. ID is following. Patient is being treated with broad-spectrum antibiotics including Rocephin and Flagyl 3. CKD st 3  Plan: Iv albumin for oncotic support Monitor for pulm edema Follow labs closely Electrolytes and Volume status are acceptable No acute indication for Dialysis at present

## 2015-05-13 NOTE — Consult Note (Signed)
[facility]     Reason for Consult: Bacteremia, SBP   Referring Physician: Theodoro Grist Date of Admission:  05/10/2015   Principal Problem:   Sepsis Active Problems:   Type II diabetes mellitus   Hypertension   COPD (chronic obstructive pulmonary disease)   Breast cancer   Chronic diastolic congestive heart failure   Restless legs syndrome   Cirrhosis of liver not due to alcohol   SBP (spontaneous bacterial peritonitis)   HPI: Becky Roberts is a 81 y.o. female with known cirrhosis admitted with NV and abd pain . Paracentesis showed impressively elevated wbc at 12000.  She was started on broad abx and her abd pain has improved. Developed ARF. Her diarrhea has starrted to improve today.No fevers chills.   Past Medical History  Diagnosis Date  . Blood transfusion reaction   . Diabetes mellitus   . Depression   . Hypertension   . Anemia 2012    hgb 5.5, admitted,  workup multifactorial,  b12 deficiency  . Arthritis   . Asthma   . Portal hypertension February 2014    CT suggested a small nodular liver, multiple varices adjacent to the spleen and inferior mediastinum and paraesophageal region. No ascites.  . Breast cancer July 2,2013    T1c,N0,M0. ER + PR- , HER-2/neu not overexpressing..  . Cirrhosis   . Cholelithiases   . CKD (chronic kidney disease) stage 3, GFR 30-59 ml/min    Past Surgical History  Procedure Laterality Date  . Breast biopsy  July 2013    [name]: wide excision   . Colonoscopy  2012   Social History  Substance Use Topics  . Smoking status: Former Research scientist (life sciences)  . Smokeless tobacco: Never Used  . Alcohol Use: No   Family History  Problem Relation Age of Onset  . Arthritis Mother   . Hypertension Mother   . Diabetes Mother   . Arthritis Father   . Hypertension Father   . Diabetes Father   . Arthritis Other   . Cancer Other     breast/lung    Allergies:  Allergies  Allergen Reactions  . Sulfa Antibiotics Hives  .  Tylenol [Acetaminophen] Other (See Comments)    Has cirrhosis of the liver secondary to Tylenol use.  . Vioxx [Rofecoxib] Hives and Itching  . Aspirin Palpitations    Current antibiotics: Antibiotics Given (last 72 hours)    Date/Time Action Medication Dose Rate   05/11/15 0957 Given   vancomycin (VANCOCIN) IVPB 1000 mg/200 mL premix 1,000 mg 200 mL/hr   05/12/15 0843 Given   metroNIDAZOLE (FLAGYL) tablet 500 mg 500 mg    05/12/15 1512 Given   metroNIDAZOLE (FLAGYL) tablet 500 mg 500 mg    05/12/15 2215 Given   vancomycin (VANCOCIN) IVPB 1000 mg/200 mL premix 1,000 mg 200 mL/hr   05/12/15 2215 Given   metroNIDAZOLE (FLAGYL) tablet 500 mg 500 mg    05/13/15 0610 Given   metroNIDAZOLE (FLAGYL) tablet 500 mg 500 mg    05/13/15 1228 Given   metroNIDAZOLE (FLAGYL) tablet 500 mg 500 mg       MEDICATIONS: . antiseptic oral rinse  7 mL Mouth Rinse BID  . cefTRIAXone (ROCEPHIN)  IV  2 g Intravenous Q24H  . exemestane  25 mg Oral Daily  . insulin aspart  0-9 Units Subcutaneous TID AC & HS  . metoCLOPramide  10 mg Oral TID AC  . metroNIDAZOLE  500 mg Oral 3 times per day  . pantoprazole  40 mg Oral Daily  . rOPINIRole  1 mg Oral QHS  . simvastatin  40 mg Oral QHS    Review of Systems - 11 systems reviewed and negative per HPI   OBJECTIVE: Temp:  [98.1 F (36.7 C)-98.3 F (36.8 C)] 98.1 F (36.7 C) (08/12 1156) Pulse Rate:  [77-92] 92 (08/12 1156) Resp:  [16-18] 16 (08/12 1156) BP: (87-120)/(38-54) 101/40 mmHg (08/12 1156) SpO2:  [93 %-99 %] 93 % (08/12 1156) Weight:  [91.989 kg (202 lb 12.8 oz)] 91.989 kg (202 lb 12.8 oz) (08/12 0520) Physical Exam  Constitutional:  Awake, interactive, chronically ill apperaing HENT: [place]/AT, PERRLA, no scleral icterus Mouth/Throat: Oropharynx is clear and dry. No oropharyngeal exudate.  Cardiovascular: Normal rate, regular rhythm and normal heart sounds.  Pulmonary/Chest: Effort normal and breath sounds normal. No respiratory distress.   has no wheezes.  Neck  supple, no nuchal rigidity Abdominal: Soft. Bowel sounds are normal.  exhibits some distension. There is no tenderness.  Lymphadenopathy: no cervical adenopathy. No axillary adenopathy Neurological: alert and oriented to person, place, and time.  Skin: Skin is warm and dry. No rash noted. No erythema.  Psychiatric: a normal mood and affect.  behavior is normal.    LABS: Results for orders placed or performed during the hospital encounter of 05/10/15 (from the past 48 hour(s))  Glucose, capillary     Status: None   Collection Time: 05/11/15  4:42 PM  Result Value Ref Range   Glucose-Capillary 91 65 - 99 mg/dL  C difficile quick scan w PCR reflex     Status: Abnormal   Collection Time: 05/11/15  6:35 PM  Result Value Ref Range   C Diff antigen POSITIVE (A) NEGATIVE   C Diff toxin NEGATIVE NEGATIVE   C Diff interpretation      Negative for toxigenic C. difficile. Toxin gene and active toxin production not detected. May be a nontoxigenic strain of C. difficile bacteria present, lacking the ability to produce toxin.  Clostridium Difficile by PCR     Status: None   Collection Time: 05/11/15  6:35 PM  Result Value Ref Range   Toxigenic C Difficile by pcr NEGATIVE NEGATIVE  Glucose, capillary     Status: Abnormal   Collection Time: 05/12/15 12:09 AM  Result Value Ref Range   Glucose-Capillary 130 (H) 65 - 99 mg/dL   Comment 1 Notify RN   Glucose, capillary     Status: Abnormal   Collection Time: 05/12/15  5:27 AM  Result Value Ref Range   Glucose-Capillary 123 (H) 65 - 99 mg/dL   Comment 1 Notify RN   Culture, blood (routine x 2)     Status: None (Preliminary result)   Collection Time: 05/12/15  9:35 AM  Result Value Ref Range   Specimen Description BLOOD LEFT WRIST    Special Requests BOTTLES DRAWN AEROBIC AND ANAEROBIC  3 CC    Culture NO GROWTH < 24 HOURS    Report Status PENDING   Culture, blood (routine x 2)     Status: None (Preliminary result)    Collection Time: 05/12/15  9:35 AM  Result Value Ref Range   Specimen Description BLOOD LEFT ASSIST CONTROL    Special Requests BOTTLES DRAWN AEROBIC AND ANAEROBIC  5 CC    Culture NO GROWTH < 24 HOURS    Report Status PENDING   Glucose, capillary     Status: Abnormal   Collection Time: 05/12/15 11:44 AM  Result Value Ref Range   Glucose-Capillary  159 (H) 65 - 99 mg/dL  Glucose, capillary     Status: Abnormal   Collection Time: 05/12/15  4:35 PM  Result Value Ref Range   Glucose-Capillary 145 (H) 65 - 99 mg/dL  Glucose, capillary     Status: Abnormal   Collection Time: 05/12/15  8:57 PM  Result Value Ref Range   Glucose-Capillary 124 (H) 65 - 99 mg/dL  Basic metabolic panel     Status: Abnormal   Collection Time: 05/13/15  7:24 AM  Result Value Ref Range   Sodium 139 135 - 145 mmol/L   Potassium 3.6 3.5 - 5.1 mmol/L   Chloride 106 101 - 111 mmol/L   CO2 26 22 - 32 mmol/L   Glucose, Bld 93 65 - 99 mg/dL   BUN 28 (H) 6 - 20 mg/dL   Creatinine, Ser 3.15 (H) 0.44 - 1.00 mg/dL   Calcium 7.4 (L) 8.9 - 10.3 mg/dL   GFR calc non Af Amer 13 (L) >60 mL/min   GFR calc Af Amer 15 (L) >60 mL/min    Comment: (NOTE) The eGFR has been calculated using the CKD EPI equation. This calculation has not been validated in all clinical situations. eGFR's persistently <60 mL/min signify possible Chronic Kidney Disease.    Anion gap 7 5 - 15  Magnesium     Status: Abnormal   Collection Time: 05/13/15  7:24 AM  Result Value Ref Range   Magnesium 1.4 (L) 1.7 - 2.4 mg/dL  CBC     Status: Abnormal   Collection Time: 05/13/15  7:24 AM  Result Value Ref Range   WBC 8.1 3.6 - 11.0 K/uL   RBC 2.98 (L) 3.80 - 5.20 MIL/uL   Hemoglobin 9.5 (L) 12.0 - 16.0 g/dL   HCT 28.8 (L) 35.0 - 47.0 %   MCV 96.6 80.0 - 100.0 fL   MCH 31.7 26.0 - 34.0 pg   MCHC 32.8 32.0 - 36.0 g/dL   RDW 16.4 (H) 11.5 - 14.5 %   Platelets 82 (L) 150 - 440 K/uL  Glucose, capillary     Status: None   Collection Time: 05/13/15   7:56 AM  Result Value Ref Range   Glucose-Capillary 96 65 - 99 mg/dL  Glucose, capillary     Status: Abnormal   Collection Time: 05/13/15 11:57 AM  Result Value Ref Range   Glucose-Capillary 146 (H) 65 - 99 mg/dL   No components found for: ESR, C REACTIVE PROTEIN MICRO: Recent Results (from the past 720 hour(s))  Urine culture     Status: None   Collection Time: 04/14/15  1:50 PM  Result Value Ref Range Status   Specimen Description URINE, CATHETERIZED  Final   Special Requests NONE  Final   Culture >=100,000 COLONIES/mL GRAM POSITIVE RODS  Final   Report Status 04/16/2015 FINAL  Final  C difficile quick scan w PCR reflex ([facility] only)     Status: None   Collection Time: 04/14/15 10:04 PM  Result Value Ref Range Status   C Diff antigen NEGATIVE  Final   C Diff toxin NEGATIVE  Final   C Diff interpretation Negative for C. difficile  Final  Body fluid culture     Status: None   Collection Time: 04/15/15  3:11 PM  Result Value Ref Range Status   Specimen Description PERITONEAL  Final   Special Requests NONE  Final   Gram Stain MODERATE WBC SEEN NO ORGANISMS SEEN   Final   Culture NO GROWTH 3  DAYS  Final   Report Status 04/18/2015 FINAL  Final  Body fluid culture     Status: None (Preliminary result)   Collection Time: 05/10/15  6:30 PM  Result Value Ref Range Status   Specimen Description PERITONEAL  Final   Special Requests Normal  Final   Gram Stain MANY WBC SEEN NO ORGANISMS SEEN   Final   Culture NO GROWTH 3 DAYS  Final   Report Status PENDING  Incomplete  Blood culture (routine x 2)     Status: None   Collection Time: 05/10/15  6:58 PM  Result Value Ref Range Status   Specimen Description BLOOD LEFT ANTECUBITAL  Final   Special Requests BOTTLES DRAWN AEROBIC AND ANAEROBIC 5ML  Final   Culture  Setup Time   Final    GRAM POSITIVE COCCI IN BOTH AEROBIC AND ANAEROBIC BOTTLES PREVIOUSLY CALLED TO DOLL FERGUSON AT 5361 05/11/15 BY TB CONFIRMED BY TB    Culture   Final     STAPHYLOCOCCUS EPIDERMIDIS IN BOTH AEROBIC AND ANAEROBIC BOTTLES WARNING: For oxacillin-resistant S.aureus and coagulase-negative staphylococci (MRS), other beta-lactam agents, ie, penicillins, beta-lactam/beta-lactamase inhibitor combinations, cephems (with the exception of the cephalosporins with anti-MRSA  activity), and carbapenems, may appear active in vitro, but are not effective clinically.  --CLSI, Vol.32 No.3, January 2012, pg 70.    Report Status 05/13/2015 FINAL  Final   Organism ID, Bacteria STAPHYLOCOCCUS EPIDERMIDIS  Final      Susceptibility   Staphylococcus epidermidis - MIC*    CIPROFLOXACIN >=8 RESISTANT Resistant     ERYTHROMYCIN >=8 RESISTANT Resistant     GENTAMICIN <=0.5 SENSITIVE Sensitive     OXACILLIN Value in next row Resistant      >=4 RESISTANTWARNING: For oxacillin-resistant S.aureus and coagulase-negative staphylococci (MRS), other beta-lactam agents, ie, penicillins, beta-lactam/beta-lactamase inhibitor combinations, cephems (with the exception of the cephalosporins with anti-MRSA activity), and carbapenems, may appear active in vitro, but are not effective clinically.  --CLSI, Vol.32 No.3, January 2012, pg 70.    TETRACYCLINE Value in next row Sensitive      >=4 RESISTANTWARNING: For oxacillin-resistant S.aureus and coagulase-negative staphylococci (MRS), other beta-lactam agents, ie, penicillins, beta-lactam/beta-lactamase inhibitor combinations, cephems (with the exception of the cephalosporins with anti-MRSA activity), and carbapenems, may appear active in vitro, but are not effective clinically.  --CLSI, Vol.32 No.3, January 2012, pg 70.    VANCOMYCIN Value in next row Sensitive      >=4 RESISTANTWARNING: For oxacillin-resistant S.aureus and coagulase-negative staphylococci (MRS), other beta-lactam agents, ie, penicillins, beta-lactam/beta-lactamase inhibitor combinations, cephems (with the exception of the cephalosporins with anti-MRSA activity), and  carbapenems, may appear active in vitro, but are not effective clinically.  --CLSI, Vol.32 No.3, January 2012, pg 70.    CLINDAMYCIN Value in next row Sensitive      >=4 RESISTANTWARNING: For oxacillin-resistant S.aureus and coagulase-negative staphylococci (MRS), other beta-lactam agents, ie, penicillins, beta-lactam/beta-lactamase inhibitor combinations, cephems (with the exception of the cephalosporins with anti-MRSA activity), and carbapenems, may appear active in vitro, but are not effective clinically.  --CLSI, Vol.32 No.3, January 2012, pg 70.    LINEZOLID Value in next row Sensitive      SENSITIVE2    CEFAZOLIN Value in next row Resistant      SENSITIVE2    * STAPHYLOCOCCUS EPIDERMIDIS  Blood culture (routine x 2)     Status: None (Preliminary result)   Collection Time: 05/10/15  6:58 PM  Result Value Ref Range Status   Specimen Description BLOOD LEFT WRIST  Final  Special Requests BOTTLES DRAWN AEROBIC AND ANAEROBIC 2ML  Final   Culture  Setup Time   Final    GRAM POSITIVE COCCI ANAEROBIC BOTTLE ONLY CRITICAL RESULT CALLED TO, READ BACK BY AND VERIFIED WITH: DOLL FERGUSON ON 05/11/15 AT 51 PM BY TB. CONFIRMED BY TB/SDR    Culture   Final    STAPHYLOCOCCUS EPIDERMIDIS WARNING: For oxacillin-resistant S.aureus and coagulase-negative staphylococci (MRS), other beta-lactam agents, ie, penicillins, beta-lactam/beta-lactamase inhibitor combinations, cephems (with the exception of the cephalosporins with anti-MRSA  activity), and carbapenems, may appear active in vitro, but are not effective clinically.  --CLSI, Vol.32 No.3, January 2012, pg 70.    Report Status PENDING  Incomplete   Organism ID, Bacteria STAPHYLOCOCCUS EPIDERMIDIS  Final      Susceptibility   Staphylococcus epidermidis - MIC*    CIPROFLOXACIN >=8 RESISTANT Resistant     ERYTHROMYCIN >=8 RESISTANT Resistant     GENTAMICIN >=16 RESISTANT Resistant     OXACILLIN Value in next row Resistant      >=4 RESISTANTWARNING:  For oxacillin-resistant S.aureus and coagulase-negative staphylococci (MRS), other beta-lactam agents, ie, penicillins, beta-lactam/beta-lactamase inhibitor combinations, cephems (with the exception of the cephalosporins with anti-MRSA activity), and carbapenems, may appear active in vitro, but are not effective clinically.  --CLSI, Vol.32 No.3, January 2012, pg 70.    TETRACYCLINE Value in next row Resistant      >=4 RESISTANTWARNING: For oxacillin-resistant S.aureus and coagulase-negative staphylococci (MRS), other beta-lactam agents, ie, penicillins, beta-lactam/beta-lactamase inhibitor combinations, cephems (with the exception of the cephalosporins with anti-MRSA activity), and carbapenems, may appear active in vitro, but are not effective clinically.  --CLSI, Vol.32 No.3, January 2012, pg 70.    VANCOMYCIN Value in next row Sensitive      >=4 RESISTANTWARNING: For oxacillin-resistant S.aureus and coagulase-negative staphylococci (MRS), other beta-lactam agents, ie, penicillins, beta-lactam/beta-lactamase inhibitor combinations, cephems (with the exception of the cephalosporins with anti-MRSA activity), and carbapenems, may appear active in vitro, but are not effective clinically.  --CLSI, Vol.32 No.3, January 2012, pg 70.    CLINDAMYCIN Value in next row Resistant      >=4 RESISTANTWARNING: For oxacillin-resistant S.aureus and coagulase-negative staphylococci (MRS), other beta-lactam agents, ie, penicillins, beta-lactam/beta-lactamase inhibitor combinations, cephems (with the exception of the cephalosporins with anti-MRSA activity), and carbapenems, may appear active in vitro, but are not effective clinically.  --CLSI, Vol.32 No.3, January 2012, pg 70.    LINEZOLID Value in next row Sensitive      SENSITIVE1    CEFAZOLIN Value in next row Resistant      SENSITIVE1    LEVOFLOXACIN Value in next row Resistant      RESISTANT>=8    * STAPHYLOCOCCUS EPIDERMIDIS  C difficile quick scan w PCR reflex      Status: Abnormal   Collection Time: 05/11/15  6:35 PM  Result Value Ref Range Status   C Diff antigen POSITIVE (A) NEGATIVE Final   C Diff toxin NEGATIVE NEGATIVE Final   C Diff interpretation   Final    Negative for toxigenic C. difficile. Toxin gene and active toxin production not detected. May be a nontoxigenic strain of C. difficile bacteria present, lacking the ability to produce toxin.  Clostridium Difficile by PCR     Status: None   Collection Time: 05/11/15  6:35 PM  Result Value Ref Range Status   Toxigenic C Difficile by pcr NEGATIVE NEGATIVE Final  Culture, blood (routine x 2)     Status: None (Preliminary result)   Collection Time: 05/12/15  9:35  AM  Result Value Ref Range Status   Specimen Description BLOOD LEFT WRIST  Final   Special Requests BOTTLES DRAWN AEROBIC AND ANAEROBIC  3 CC  Final   Culture NO GROWTH < 24 HOURS  Final   Report Status PENDING  Incomplete  Culture, blood (routine x 2)     Status: None (Preliminary result)   Collection Time: 05/12/15  9:35 AM  Result Value Ref Range Status   Specimen Description BLOOD LEFT ASSIST CONTROL  Final   Special Requests BOTTLES DRAWN AEROBIC AND ANAEROBIC  5 CC  Final   Culture NO GROWTH < 24 HOURS  Final   Report Status PENDING  Incomplete    IMAGING: Ct Abdomen Pelvis Wo Contrast  05/10/2015   CLINICAL DATA:  Nausea, vomiting, diarrhea  EXAM: CT ABDOMEN AND PELVIS WITHOUT CONTRAST  TECHNIQUE: Multidetector CT imaging of the abdomen and pelvis was performed following the standard protocol without IV contrast.  COMPARISON:  Seven/ 14/16  FINDINGS: Sagittal images of the spine shows again degenerative changes in lower thoracic spine. Degenerative changes lumbar spine L4-L5 level. Study is limited without IV contrast. Bilateral small pleural effusion with bilateral lower lobe atelectasis. Cirrhosis of the liver again noted. There is abundant perihepatic and perisplenic ascites. Abundant ascites noted bilateral paracolic  gutters left greater than right. Multiple calcified gallstones are again noted within gallbladder. Atherosclerotic calcifications of abdominal aorta and iliac arteries.  No aortic aneurysm. Moderate distended proximal small bowel loops probable due to enteritis especially and in jejunum. There is no definite evidence of small bowel obstruction. Distal small bowel is smaller caliber probable empty partially collapsed.  Unenhanced uterus and ovaries unremarkable. The terminal ileum is unremarkable.  Paraesophageal varices again noted. Oral contrast material was given to the patient. No evidence of gastric outlet obstruction.  Unenhanced kidney shows no nephrolithiasis. No hydronephrosis or hydroureter. Unenhanced pancreas, spleen and adrenal glands are unremarkable.  IMPRESSION: 1. Again noted cirrhosis of the liver. Again noted abdominal and pelvic ascites. 2. There are moderate distended jejunal loops probable due to enteritis or ileus. No definite evidence of small bowel obstruction. 3. No hydronephrosis or hydroureter is. Again noted paraesophageal varices. 4. Bilateral small pleural effusion with bilateral lower lobe posterior atelectasis. 5. No evidence of gastric outlet obstruction.   Electronically Signed   By: Lahoma Crocker M.D.   On: 05/10/2015 19:53   Ct Abdomen Pelvis W Contrast  04/14/2015   CLINICAL DATA:  Lower abdominal pain.  EXAM: CT ABDOMEN AND PELVIS WITH CONTRAST  TECHNIQUE: Multidetector CT imaging of the abdomen and pelvis was performed using the standard protocol following bolus administration of intravenous contrast.  CONTRAST:  26m OMNIPAQUE IOHEXOL 300 MG/ML  SOLN  COMPARISON:  CT scan of November 19, 2012.  FINDINGS: Moderate degenerative disc disease is noted at L4-5. Mild bilateral pleural effusions are noted with left greater than right. Adjacent subsegmental atelectasis is noted.  Multiple gallstones are noted. Severe hepatic cirrhosis is noted without evidence of focal mass. Spleen  appears normal in size and appearance. The pancreas appears normal. Paraesophageal varices are noted as well as varices in the porta hepatis region. This is consistent with portal hypertension. Moderate ascites is noted. Atherosclerosis of abdominal aorta is noted without aneurysm formation. Adrenal glands appear normal. Stable bilateral renal cysts are noted. Moderate proximal small bowel dilatation is noted which which appears to be due to focal enteritis involving the jejunum. Uterus appears normal. The appendix appears normal. Urinary bladder appears normal. No significant  adenopathy is noted.  IMPRESSION: Atherosclerosis of abdominal aorta is noted without aneurysm formation.  Mild bilateral pleural effusions are noted with adjacent sub segmental atelectasis, left greater than right.  Cholelithiasis is again noted.  Severe hepatic cirrhosis is noted with paraesophageal varices and varices in the porta hepatis region consistent with portal hypertension.  Moderate ascites is noted. Proximal small bowel dilatation is noted which appears to be due to focal enteritis of the jejunum with wall thickening.   Electronically Signed   By: Marijo Conception, M.D.   On: 04/14/2015 16:50   US Venous Img Lower Bilateral  04/15/2015   EXAM: BILATERAL LOWER EXTREMITY VENOUS DOPPLER ULTRASOUND  TECHNIQUE: Gray-scale sonography with graded compression, as well as color Doppler and duplex ultrasound were performed to evaluate the lower extremity deep venous systems from the level of the common femoral vein and including the common femoral, femoral, profunda femoral, popliteal and calf veins including the posterior tibial, peroneal and gastrocnemius veins when visible. The superficial great saphenous vein was also interrogated. Spectral Doppler was utilized to evaluate flow at rest and with distal augmentation maneuvers in the common femoral, femoral and popliteal veins.  COMPARISON:  None.  FINDINGS: RIGHT LOWER EXTREMITY  Common  Femoral Vein: No evidence of thrombus. Normal compressibility, respiratory phasicity and response to augmentation.  Saphenofemoral Junction: No evidence of thrombus. Normal compressibility and flow on color Doppler imaging.  Profunda Femoral Vein: No evidence of thrombus. Normal compressibility and flow on color Doppler imaging.  Femoral Vein: No evidence of thrombus. Normal compressibility, respiratory phasicity and response to augmentation.  Popliteal Vein: No evidence of thrombus. Normal compressibility, respiratory phasicity and response to augmentation.  Calf Veins: Calf veins not well visualized due to body habitus. No evidence of thrombus.  Superficial Great Saphenous Vein: No evidence of thrombus. Normal compressibility and flow on color Doppler imaging.  Venous Reflux:  None.  Other Findings:  None.  LEFT LOWER EXTREMITY  Common Femoral Vein: No evidence of thrombus. Normal compressibility, respiratory phasicity and response to augmentation.  Saphenofemoral Junction: No evidence of thrombus. Normal compressibility and flow on color Doppler imaging.  Profunda Femoral Vein: No evidence of thrombus. Normal compressibility and flow on color Doppler imaging.  Femoral Vein: No evidence of thrombus. Normal compressibility, respiratory phasicity and response to augmentation.  Popliteal Vein: No evidence of thrombus. Normal compressibility, respiratory phasicity and response to augmentation.  Calf Veins: Calf veins not well visualized due to body habitus. No evidence of thrombus.  Superficial Great Saphenous Vein: No evidence of thrombus. Normal compressibility and flow on color Doppler imaging.  Venous Reflux:  None.  Other Findings:  None.  IMPRESSION: No evidence of deep venous thrombosis.   Electronically Signed   By: Marcello Moores  Register   On: 04/15/2015 15:03   US Paracentesis  04/15/2015   CLINICAL DATA:  Ascites.  EXAM: ULTRASOUND GUIDED PARACENTESIS  COMPARISON:  None.  PROCEDURE: An ultrasound guided  paracentesis was thoroughly discussed with the patient and questions answered. The benefits, risks, alternatives and complications were also discussed. The patient understands and wishes to proceed with the procedure. Written consent was obtained.  Ultrasound was performed to localize and mark an adequate pocket of fluid in the left lower quadrant of the abdomen. The area was then prepped and draped in the normal sterile fashion. 1% Lidocaine was used for local anesthesia. Under ultrasound guidance a Safe-T-Centesis catheter was introduced. Paracentesis was performed. The catheter was removed and a dressing applied.  COMPLICATIONS: None immediate.  FINDINGS: A total of approximately 3.9 L of serous fluid was removed. A fluid sample was sent for laboratory analysis.  IMPRESSION: Successful ultrasound guided paracentesis yielding 3.9 L of ascites.   Electronically Signed   By: Marijo Conception, M.D.   On: 04/15/2015 16:20    Assessment:   DORATHA MCSWAIN is a 81 y.o. female with cirrhosis admitted with SBP SBP -culture negative - clinically improving Coag negative staph bacteremia- echo neg, no lines or hardware in place - likely contaminant Diarrhea - C diff neg ARF  Recommendations Agree with Dc vanco - repeat bcx 8/1 neg Cont ceftriaxone and flagyl  Would suggest 5-7 day course of ceftriaxone for the SBP  Thank you very much for allowing me to participate in the care of this patient. Please call with questions.   Cheral Marker. Ola Spurr, MD

## 2015-05-13 NOTE — Progress Notes (Signed)
[facility] at [place] NAME: Becky Roberts    MR#:  [id number]  DATE OF BIRTH:  July 05, 1932  SUBJECTIVE:  CHIEF COMPLAINT:   Chief Complaint  Patient presents with  . Nausea  . Emesis  . Diarrhea   Patient is 81 year old Caucasian female with history of liver cirrhosis, ascites, who presents to the hospital with complaints of nausea, vomiting and diarrhea as well as abdominal pain. She underwent peritoneal tap yesterday on admission which revealed the more than 12,000 white blood cells, 88% of them were neutrophils signifying spontaneous bacterial peritonitis, patient was initiated on broad-spectrum antibiotic therapy with Rocephin and vancomycin. Patient admits of nausea, however, denies any more vomiting, admits of diarrheal stool, admits of abdominal pain radiating to from one side of abdomen to other as well as in pain in the lower abdomen, more on the right side. CT of abdomen and pelvis revealed liver cirrhosis, ascites distention of jejunal loops suggested enteritis Blood culture taken on 05/10/2015 showed gram-positive cocci, stool for C. difficile revealed no C. difficile toxin, however, positive for C. difficile antigen. Patient had significant diarrhea with green stool liquidy yesterday and was hypotensive. Stool frequency decreased and patient did not have bowel movements overnight. Pressure is somewhat better today, however, patient's kidney function has much worsened since yesterday. Patient is not on diuretics Review of Systems  Constitutional: Positive for chills and malaise/fatigue. Negative for fever and weight loss.  HENT: Negative for congestion.   Eyes: Negative for blurred vision and double vision.  Respiratory: Negative for cough, sputum production, shortness of breath and wheezing.   Cardiovascular: Negative for chest pain, palpitations, orthopnea, leg swelling and PND.  Gastrointestinal: Positive for abdominal pain  and diarrhea. Negative for nausea, vomiting, constipation and blood in stool.  Genitourinary: Negative for dysuria, urgency, frequency and hematuria.  Musculoskeletal: Negative for falls.  Neurological: Negative for dizziness, tremors, focal weakness and headaches.  Endo/Heme/Allergies: Does not bruise/bleed easily.  Psychiatric/Behavioral: Negative for depression. The patient does not have insomnia.     VITAL SIGNS: Blood pressure 101/40, pulse 92, temperature 98.1 F (36.7 C), temperature source Oral, resp. rate 16, height 5\' 2"  (1.575 m), weight 91.989 kg (202 lb 12.8 oz), SpO2 93 %.  PHYSICAL EXAMINATION:   GENERAL:  81 y.o.-year-old patient lying in the bed with no acute distress. Dry oral mucosa EYES: Pupils equal, round, reactive to light and accommodation. No scleral icterus. Extraocular muscles intact.  HEENT: Head atraumatic, normocephalic. Oropharynx and nasopharynx clear.  NECK:  Supple, no jugular venous distention. No thyroid enlargement, no tenderness.  LUNGS: Normal breath sounds bilaterally, no wheezing, rales,rhonchi or crepitation. No use of accessory muscles of respiration.  CARDIOVASCULAR: S1, S2 normal. No murmurs, rubs, or gallops.  ABDOMEN: Soft, no discomfort on palpation diffusely. No discrete areas of disc of pain was noted. No rebound or guarding , nondistended. Bowel sounds present. No organomegaly or mass.  EXTREMITIES: No pedal edema, cyanosis, or clubbing.  NEUROLOGIC: Cranial nerves II through XII are intact. Muscle strength 5/5 in all extremities. Sensation intact. Gait not checked.  PSYCHIATRIC: The patient is alert and oriented x 3.  SKIN: No obvious rash, lesion, or ulcer.   ORDERS/RESULTS REVIEWED:   CBC  Recent Labs Lab 05/10/15 1524 05/11/15 0426 05/13/15 0724  WBC 4.7 9.4 8.1  HGB 13.4 10.9* 9.5*  HCT 40.2 32.8* 28.8*  PLT 142* 98* 82*  MCV 96.2 96.2 96.6  MCH 32.1 32.0 31.7  MCHC 33.4 33.3  32.8  RDW 15.9* 16.0* 16.4*    ------------------------------------------------------------------------------------------------------------------  Chemistries   Recent Labs Lab 05/10/15 1524 05/11/15 0426 05/13/15 0724  NA 141 142 139  K 3.3* 3.2* 3.6  CL 105 109 106  CO2 28 25 26   GLUCOSE 122* 102* 93  BUN 9 13 28*  CREATININE 1.35* 1.69* 3.15*  CALCIUM 8.1* 7.5* 7.4*  MG  --  1.3* 1.4*  AST 46*  --   --   ALT 17  --   --   ALKPHOS 80  --   --   BILITOT 2.0*  --   --    ------------------------------------------------------------------------------------------------------------------ estimated creatinine clearance is 14.8 mL/min (by C-G formula based on Cr of 3.15). ------------------------------------------------------------------------------------------------------------------ No results for input(s): TSH, T4TOTAL, T3FREE, THYROIDAB in the last 72 hours.  Invalid input(s): FREET3  Cardiac Enzymes  Recent Labs Lab 05/10/15 [place] <0.03   ------------------------------------------------------------------------------------------------------------------ Invalid input(s): POCBNP ---------------------------------------------------------------------------------------------------------------  RADIOLOGY: No results found.  EKG:  Orders placed or performed during the hospital encounter of 05/10/15  . EKG 12-Lead  . 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 Spontaneous bacterial peritonitis, continue Rocephin, peritoneal fluid cultures are negative, clinically improving 2. Bacteremia due to Staphylococcus aureus epidermidis, likely contamination ,  repeated blood cultures taken 11th  of August 2016 are negative so far. Discontinue vancomycin. echocardiogram was unremarkable with no valvular involvement , awaiting for infectious diseases input. Patient is stable clinically 3. Acute on Chronic renal failure, likely ATN due to hypotension with diarrhea, UA was unremarkable, not suggestive of urinary tract infection, initiate IV fluids and follow blood pressure closely. Recheck BMP later today. Get nephrology consultation. Discussed with Dr. Candiss Norse. Palliative care consultation as well 4. Hypokalemia, supplemented orally, resolved. Low magnesium level, supplemented IV 5. Ascites. Holding diuretics, Lasix as well as spironolactone due to diarrhea 6.  C. difficile enterocolitis, advance diet to full liquid , continue Flagyl orally, would need to continue 14 day therapy after last dose of antibiotic, diarrhea has resolved. She is severely dehydrated due to diarrhea. IV fluids will be administered over the next few hours following kidney function closely Management plans discussed with the patient, Dr. Candiss Norse and they are in agreement.   DRUG ALLERGIES:  Allergies  Allergen Reactions  . Sulfa Antibiotics Hives  . Tylenol [Acetaminophen] Other (See Comments)    Has cirrhosis of the liver secondary to Tylenol use.  . Vioxx [Rofecoxib] Hives and Itching  . Aspirin Palpitations    CODE STATUS:     Code Status Orders        Start     Ordered   05/10/15 2254  Do not attempt resuscitation (DNR)   Continuous    Question Answer Comment  In the event of cardiac or respiratory ARREST Do not call a "code blue"   In the event of cardiac or respiratory ARREST Do not perform Intubation, CPR, defibrillation or ACLS   In the event of cardiac or respiratory ARREST Use medication by any route, position, wound care, and other measures to relive pain and suffering. May use oxygen, suction and manual treatment of airway obstruction as needed for comfort.  05/10/15 2253      TOTAL  CRITICAL CARE TIME TAKING CARE OF THIS PATIENT: 40 minutes.    Theodoro Grist M.D on 05/13/2015 at 2:35 PM  Between 7am to 6pm - Pager - [phone]  After 6pm go to [email or web address] - password [email or web address] [facility]  [facility]  Office  [phone]  CC: Primary care physician; No primary care provider on file.
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
## 2015-05-14 LAB — BODY FLUID CULTURE
CULTURE: NO GROWTH
Special Requests: NORMAL

## 2015-05-14 LAB — CULTURE, BLOOD (ROUTINE X 2)

## 2015-05-14 LAB — COMPREHENSIVE METABOLIC PANEL
ALT: 16 U/L (ref 14–54)
AST: 42 U/L — ABNORMAL HIGH (ref 15–41)
Albumin: 1.9 g/dL — ABNORMAL LOW (ref 3.5–5.0)
Alkaline Phosphatase: 63 U/L (ref 38–126)
Anion gap: 4 — ABNORMAL LOW (ref 5–15)
BUN: 29 mg/dL — ABNORMAL HIGH (ref 6–20)
CO2: 26 mmol/L (ref 22–32)
CREATININE: 2.97 mg/dL — AB (ref 0.44–1.00)
Calcium: 7.3 mg/dL — ABNORMAL LOW (ref 8.9–10.3)
Chloride: 108 mmol/L (ref 101–111)
GFR calc Af Amer: 16 mL/min — ABNORMAL LOW (ref >60)
GFR calc non Af Amer: 14 mL/min — ABNORMAL LOW (ref >60)
GLUCOSE: 106 mg/dL — AB (ref 65–99)
Potassium: 3.4 mmol/L — ABNORMAL LOW (ref 3.5–5.1)
Sodium: 138 mmol/L (ref 135–145)
Total Bilirubin: 0.8 mg/dL (ref 0.3–1.2)
Total Protein: 4.7 g/dL — ABNORMAL LOW (ref 6.5–8.1)

## 2015-05-14 LAB — GLUCOSE, CAPILLARY
GLUCOSE-CAPILLARY: 98 mg/dL (ref 65–99)
GLUCOSE-CAPILLARY: 158 mg/dL — AB (ref 65–99)
Glucose-Capillary: 145 mg/dL — ABNORMAL HIGH (ref 65–99)

## 2015-05-14 LAB — HEMOGLOBIN: Hemoglobin: 10 g/dL — ABNORMAL LOW (ref 12.0–16.0)

## 2015-05-14 LAB — PHOSPHORUS: PHOSPHORUS: 3.6 mg/dL (ref 2.5–4.6)

## 2015-05-14 NOTE — Progress Notes (Signed)
[place] at [place] NAME: Becky Roberts    MR#:  [id number]  DATE OF BIRTH:  June 22, 1932  SUBJECTIVE:  CHIEF COMPLAINT:   Chief Complaint  Patient presents with  . Nausea  . Emesis  . Diarrhea   Patient is 81 year old Caucasian female with history of liver cirrhosis, ascites, who presents to the hospital with complaints of nausea, vomiting and diarrhea as well as abdominal pain. She underwent peritoneal tap yesterday on admission which revealed the more than 12,000 white blood cells, 88% of them were neutrophils signifying spontaneous bacterial peritonitis, patient was initiated on broad-spectrum antibiotic therapy with Rocephin and vancomycin. Patient admits of nausea, however, denies any more vomiting, admits of diarrheal stool, admits of abdominal pain radiating to from one side of abdomen to other as well as in pain in the lower abdomen, more on the right side. CT of abdomen and pelvis revealed liver cirrhosis, ascites distention of jejunal loops suggested enteritis Blood culture taken on 05/10/2015 showed gram-positive cocci, stool for C. difficile revealed no C. difficile toxin, however, positive for C. difficile antigen. Patient had significant diarrhea with green stool liquidy yesterday and was hypotensive. Stool frequency has decreased and patient did not have bowel movements overnight. Blood pressure has improved , kidney function slightly improved. Now patient is on albumin IV per nephrology consultation. Patient feels poorly. Denies any pain. Feels weak . Complains of finger swelling, admits of orthopnea Review of Systems  Constitutional: Positive for chills and malaise/fatigue. Negative for fever and weight loss.  HENT: Negative for congestion.   Eyes: Negative for blurred vision and double vision.  Respiratory: Negative for cough, sputum production, shortness of breath and wheezing.   Cardiovascular: Negative for chest pain,  palpitations, orthopnea, leg swelling and PND.  Gastrointestinal: Positive for abdominal pain and diarrhea. Negative for nausea, vomiting, constipation and blood in stool.  Genitourinary: Negative for dysuria, urgency, frequency and hematuria.  Musculoskeletal: Negative for falls.  Neurological: Negative for dizziness, tremors, focal weakness and headaches.  Endo/Heme/Allergies: Does not bruise/bleed easily.  Psychiatric/Behavioral: Negative for depression. The patient does not have insomnia.     VITAL SIGNS: Blood pressure 127/55, pulse 83, temperature 98.2 F (36.8 C), temperature source Oral, resp. rate 20, height 5\' 2"  (1.575 m), weight 92.15 kg (203 lb 2.5 oz), SpO2 98 %.  PHYSICAL EXAMINATION:   GENERAL:  81 y.o.-year-old patient lying in the bed with no acute distress. Better hydrated. Diffuse anasarca. Mildly shortened short of breath and tachypneic EYES: Pupils equal, round, reactive to light and accommodation. No scleral icterus. Extraocular muscles intact.  HEENT: Head atraumatic, normocephalic. Oropharynx and nasopharynx clear.  NECK:  Supple, no jugular venous distention. No thyroid enlargement, no tenderness.  LUNGS: Normal breath sounds bilaterally, no wheezing, rales,rhonchi or crepitation. No use of accessory muscles of respiration.  CARDIOVASCULAR: S1, S2 normal. No murmurs, rubs, or gallops.  ABDOMEN: Soft, no discomfort on palpation diffusely. Fluid wave was felt No discrete areas of disc of pain was noted. No rebound or guarding , nondistended. Bowel sounds present. No organomegaly or mass.  EXTREMITIES: No pedal edema, cyanosis, or clubbing.  NEUROLOGIC: Cranial nerves II through XII are intact. Muscle strength 5/5 in all extremities. Sensation intact. Gait not checked.  PSYCHIATRIC: The patient is alert and oriented x 3.  SKIN: No obvious rash, lesion, or ulcer. Diffuse anasarca  ORDERS/RESULTS REVIEWED:   CBC  Recent Labs Lab 05/10/15 1524 05/11/15 0426  05/13/15 0724 05/14/15 0403  WBC  4.7 9.4 8.1  --   HGB 13.4 10.9* 9.5* 10.0*  HCT 40.2 32.8* 28.8*  --   PLT 142* 98* 82*  --   MCV 96.2 96.2 96.6  --   MCH 32.1 32.0 31.7  --   MCHC 33.4 33.3 32.8  --   RDW 15.9* 16.0* 16.4*  --    ------------------------------------------------------------------------------------------------------------------  Chemistries   Recent Labs Lab 05/10/15 1524 05/11/15 0426 05/13/15 0724 05/13/15 1722 05/14/15 0403  NA 141 142 139 137 138  K 3.3* 3.2* 3.6 3.9 3.4*  CL 105 109 106 107 108  CO2 28 25 26 26 26   GLUCOSE 122* 102* 93 117* 106*  BUN 9 13 28* 29* 29*  CREATININE 1.35* 1.69* 3.15* 3.02* 2.97*  CALCIUM 8.1* 7.5* 7.4* 7.6* 7.3*  MG  --  1.3* 1.4*  --   --   AST 46*  --   --   --  42*  ALT 17  --   --   --  16  ALKPHOS 80  --   --   --  63  BILITOT 2.0*  --   --   --  0.8   ------------------------------------------------------------------------------------------------------------------ estimated creatinine clearance is 15.7 mL/min (by C-G formula based on Cr of 2.97). ------------------------------------------------------------------------------------------------------------------ No results for input(s): TSH, T4TOTAL, T3FREE, THYROIDAB in the last 72 hours.  Invalid input(s): FREET3  Cardiac Enzymes  Recent Labs Lab 05/10/15 [place] <0.03   ------------------------------------------------------------------------------------------------------------------ Invalid input(s): POCBNP ---------------------------------------------------------------------------------------------------------------  RADIOLOGY: No results found.  EKG:  Orders placed or performed during the hospital encounter of 05/10/15  . EKG 12-Lead  . EKG 12-Lead    ASSESSMENT AND PLAN:  Principal Problem:   Sepsis Active Problems:   Type II diabetes mellitus   Hypertension   COPD (chronic obstructive pulmonary disease)   Breast cancer    Chronic diastolic congestive heart failure   Restless legs syndrome   Cirrhosis of liver not due to alcohol   SBP (spontaneous bacterial peritonitis)  1. Spontaneous bacterial peritonitis, continue Rocephin for 5-7 days per Dr. Blane Ohara recommendations, today is day fifths, peritoneal fluid cultures are negative, clinically improving 2. Bacteremia due to Staphylococcus aureus epidermidis, likely contamination ,  repeated blood cultures taken 11th of August 2016 are negative so far. Of vancomycin. echocardiogram was unremarkable with no valvular involvement , appreciate infectious diseases input. Patient is stable clinically 3. Acute on Chronic renal failure, likely ATN due to hypotension with diarrhea, UA was unremarkable, not suggestive of urinary tract infection, now on albumin infusion, kidney function is slightly better than yesterday.  blood pressure has improved. BMP tomorrow morning. Appreciate nephrology input, hopefully will not need hemodialysis. Discussed with Dr. Juleen China. Palliative care consultation for Monday 4. Hypokalemia, supplemented orally. low magnesium level, supplemented IV, follow in the morning 5. Ascites. Holding diuretics, Lasix as well as spironolactone due to diarrhea, renal failure 6.  C. difficile enterocolitis, continue full liquid, advance to soft as tolerated , continue Flagyl orally, would need to continue 14 day therapy after last dose of Rocephin, diarrhea has subsided.   Management plans discussed with the patient, Dr. Juleen China and they are in agreement.   DRUG ALLERGIES:  Allergies  Allergen Reactions  . Sulfa Antibiotics Hives  . Tylenol [Acetaminophen] Other (See Comments)    Has cirrhosis of the liver secondary to Tylenol use.  . Vioxx [Rofecoxib] Hives and Itching  . Aspirin Palpitations    CODE STATUS:     Code Status Orders  Start     Ordered   05/10/15 2254  Do not attempt resuscitation (DNR)   Continuous    Question Answer  Comment  In the event of cardiac or respiratory ARREST Do not call a "code blue"   In the event of cardiac or respiratory ARREST Do not perform Intubation, CPR, defibrillation or ACLS   In the event of cardiac or respiratory ARREST Use medication by any route, position, wound care, and other measures to relive pain and suffering. May use oxygen, suction and manual treatment of airway obstruction as needed for comfort.      05/10/15 2253      TOTAL CRITICAL CARE TIME TAKING CARE OF THIS PATIENT: 40 minutes.    Theodoro Grist M.D on 05/14/2015 at 1:53 PM  Between 7am to 6pm - Pager - [phone]  After 6pm go to [email or web address] - password [email or web address] [facility]  [facility]  Office  [phone]  CC: Primary care physician; No primary care provider on file.

## 2015-05-14 NOTE — Progress Notes (Signed)
A&O. Bedbound. Slept well through the night. IV fluids infusing. On 1 L O2. Medicated for pain and anxiety.

## 2015-05-14 NOTE — Progress Notes (Signed)
[facility]  ROUNDING NOTE   Subjective:   Patient has increasing peripheral edema this morning. Urine output not recorded. Patient states she is urinating, incontinent.    Objective:  Vital signs in last 24 hours:  Temp:  [98.1 F (36.7 C)-98.9 F (37.2 C)] 98.9 F (37.2 C) (08/13 0856) Pulse Rate:  [43-92] 78 (08/13 0856) Resp:  [16-23] 17 (08/13 0856) BP: (101-125)/(40-58) 125/49 mmHg (08/13 0856) SpO2:  [93 %-99 %] 98 % (08/13 0856) Weight:  [92.15 kg (203 lb 2.5 oz)] 92.15 kg (203 lb 2.5 oz) (08/13 0417)  Weight change: 0.16 kg (5.7 oz) Filed Weights   05/12/15 0533 05/13/15 0520 05/14/15 0417  Weight: 91.309 kg (201 lb 4.8 oz) 91.989 kg (202 lb 12.8 oz) 92.15 kg (203 lb 2.5 oz)    Intake/Output: I/O last 3 completed shifts: In: 1531.3 [I.V.:1181.3; IV Piggyback:350] Out: 0    Intake/Output this shift:     Physical Exam: General: NAD  Head: Normocephalic, atraumatic. Moist oral mucosal membranes  Eyes: Anicteric, PERRL  Neck: Supple, trachea midline  Lungs:  Clear to auscultation  Heart: Regular rate and rhythm  Abdomen:  +distended, +ascites, tender to palpation  Extremities:  1+peripheral edema.  Neurologic: Nonfocal, moving all four extremities  Skin: No lesions       Basic Metabolic Panel:  Recent Labs Lab 05/10/15 1524 05/11/15 0426 05/13/15 0724 05/13/15 1722 05/14/15 0403  NA 141 142 139 137 138  K 3.3* 3.2* 3.6 3.9 3.4*  CL 105 109 106 107 108  CO2 28 25 26 26 26   GLUCOSE 122* 102* 93 117* 106*  BUN 9 13 28* 29* 29*  CREATININE 1.35* 1.69* 3.15* 3.02* 2.97*  CALCIUM 8.1* 7.5* 7.4* 7.6* 7.3*  MG  --  1.3* 1.4*  --   --   PHOS  --   --   --   --  3.6    Liver Function Tests:  Recent Labs Lab 05/10/15 1524 05/14/15 0403  AST 46* 42*  ALT 17 16  ALKPHOS 80 63  BILITOT 2.0* 0.8  PROT 5.3* 4.7*  ALBUMIN 2.1* 1.9*    Recent Labs Lab 05/10/15 1554  LIPASE 34   No results for input(s): AMMONIA in the last 168  hours.  CBC:  Recent Labs Lab 05/10/15 1524 05/11/15 0426 05/13/15 0724 05/14/15 0403  WBC 4.7 9.4 8.1  --   HGB 13.4 10.9* 9.5* 10.0*  HCT 40.2 32.8* 28.8*  --   MCV 96.2 96.2 96.6  --   PLT 142* 98* 82*  --     Cardiac Enzymes:  Recent Labs Lab 05/10/15 1554  TROPONINI <0.03    BNP: Invalid input(s): POCBNP  CBG:  Recent Labs Lab 05/13/15 0756 05/13/15 1157 05/13/15 1642 05/13/15 2026 05/14/15 0749  GLUCAP 96 146* 110* 135* 64    Microbiology: Results for orders placed or performed during the hospital encounter of 05/10/15  Body fluid culture     Status: None (Preliminary result)   Collection Time: 05/10/15  6:30 PM  Result Value Ref Range Status   Specimen Description PERITONEAL  Final   Special Requests Normal  Final   Gram Stain MANY WBC SEEN NO ORGANISMS SEEN   Final   Culture NO GROWTH 3 DAYS  Final   Report Status PENDING  Incomplete  Blood culture (routine x 2)     Status: None   Collection Time: 05/10/15  6:58 PM  Result Value Ref Range Status   Specimen Description BLOOD  LEFT ANTECUBITAL  Final   Special Requests BOTTLES DRAWN AEROBIC AND ANAEROBIC 5ML  Final   Culture  Setup Time   Final    GRAM POSITIVE COCCI IN BOTH AEROBIC AND ANAEROBIC BOTTLES PREVIOUSLY CALLED TO DOLL FERGUSON AT 5631 05/11/15 BY TB CONFIRMED BY TB    Culture   Final    STAPHYLOCOCCUS EPIDERMIDIS IN BOTH AEROBIC AND ANAEROBIC BOTTLES WARNING: For oxacillin-resistant S.aureus and coagulase-negative staphylococci (MRS), other beta-lactam agents, ie, penicillins, beta-lactam/beta-lactamase inhibitor combinations, cephems (with the exception of the cephalosporins with anti-MRSA  activity), and carbapenems, may appear active in vitro, but are not effective clinically.  --CLSI, Vol.32 No.3, January 2012, pg 70.    Report Status 05/13/2015 FINAL  Final   Organism ID, Bacteria STAPHYLOCOCCUS EPIDERMIDIS  Final      Susceptibility   Staphylococcus epidermidis - MIC*     CIPROFLOXACIN >=8 RESISTANT Resistant     ERYTHROMYCIN >=8 RESISTANT Resistant     GENTAMICIN <=0.5 SENSITIVE Sensitive     OXACILLIN Value in next row Resistant      >=4 RESISTANTWARNING: For oxacillin-resistant S.aureus and coagulase-negative staphylococci (MRS), other beta-lactam agents, ie, penicillins, beta-lactam/beta-lactamase inhibitor combinations, cephems (with the exception of the cephalosporins with anti-MRSA activity), and carbapenems, may appear active in vitro, but are not effective clinically.  --CLSI, Vol.32 No.3, January 2012, pg 70.    TETRACYCLINE Value in next row Sensitive      >=4 RESISTANTWARNING: For oxacillin-resistant S.aureus and coagulase-negative staphylococci (MRS), other beta-lactam agents, ie, penicillins, beta-lactam/beta-lactamase inhibitor combinations, cephems (with the exception of the cephalosporins with anti-MRSA activity), and carbapenems, may appear active in vitro, but are not effective clinically.  --CLSI, Vol.32 No.3, January 2012, pg 70.    VANCOMYCIN Value in next row Sensitive      >=4 RESISTANTWARNING: For oxacillin-resistant S.aureus and coagulase-negative staphylococci (MRS), other beta-lactam agents, ie, penicillins, beta-lactam/beta-lactamase inhibitor combinations, cephems (with the exception of the cephalosporins with anti-MRSA activity), and carbapenems, may appear active in vitro, but are not effective clinically.  --CLSI, Vol.32 No.3, January 2012, pg 70.    CLINDAMYCIN Value in next row Sensitive      >=4 RESISTANTWARNING: For oxacillin-resistant S.aureus and coagulase-negative staphylococci (MRS), other beta-lactam agents, ie, penicillins, beta-lactam/beta-lactamase inhibitor combinations, cephems (with the exception of the cephalosporins with anti-MRSA activity), and carbapenems, may appear active in vitro, but are not effective clinically.  --CLSI, Vol.32 No.3, January 2012, pg 70.    LINEZOLID Value in next row Sensitive      SENSITIVE2     CEFAZOLIN Value in next row Resistant      SENSITIVE2    * STAPHYLOCOCCUS EPIDERMIDIS  Blood culture (routine x 2)     Status: None   Collection Time: 05/10/15  6:58 PM  Result Value Ref Range Status   Specimen Description BLOOD LEFT WRIST  Final   Special Requests BOTTLES DRAWN AEROBIC AND ANAEROBIC 2ML  Final   Culture  Setup Time   Final    GRAM POSITIVE COCCI ANAEROBIC BOTTLE ONLY CRITICAL RESULT CALLED TO, READ BACK BY AND VERIFIED WITH: DOLL FERGUSON ON 05/11/15 AT 40 PM BY TB. CONFIRMED BY TB/SDR    Culture   Final    STAPHYLOCOCCUS EPIDERMIDIS WARNING: For oxacillin-resistant S.aureus and coagulase-negative staphylococci (MRS), other beta-lactam agents, ie, penicillins, beta-lactam/beta-lactamase inhibitor combinations, cephems (with the exception of the cephalosporins with anti-MRSA  activity), and carbapenems, may appear active in vitro, but are not effective clinically.  --CLSI, Vol.32 No.3, January 2012, pg 70.  Report Status 05/14/2015 FINAL  Final   Organism ID, Bacteria STAPHYLOCOCCUS EPIDERMIDIS  Final      Susceptibility   Staphylococcus epidermidis - MIC*    CIPROFLOXACIN >=8 RESISTANT Resistant     ERYTHROMYCIN >=8 RESISTANT Resistant     GENTAMICIN >=16 RESISTANT Resistant     OXACILLIN Value in next row Resistant      >=4 RESISTANTWARNING: For oxacillin-resistant S.aureus and coagulase-negative staphylococci (MRS), other beta-lactam agents, ie, penicillins, beta-lactam/beta-lactamase inhibitor combinations, cephems (with the exception of the cephalosporins with anti-MRSA activity), and carbapenems, may appear active in vitro, but are not effective clinically.  --CLSI, Vol.32 No.3, January 2012, pg 70.    TETRACYCLINE Value in next row Resistant      >=4 RESISTANTWARNING: For oxacillin-resistant S.aureus and coagulase-negative staphylococci (MRS), other beta-lactam agents, ie, penicillins, beta-lactam/beta-lactamase inhibitor combinations, cephems (with the  exception of the cephalosporins with anti-MRSA activity), and carbapenems, may appear active in vitro, but are not effective clinically.  --CLSI, Vol.32 No.3, January 2012, pg 70.    VANCOMYCIN Value in next row Sensitive      >=4 RESISTANTWARNING: For oxacillin-resistant S.aureus and coagulase-negative staphylococci (MRS), other beta-lactam agents, ie, penicillins, beta-lactam/beta-lactamase inhibitor combinations, cephems (with the exception of the cephalosporins with anti-MRSA activity), and carbapenems, may appear active in vitro, but are not effective clinically.  --CLSI, Vol.32 No.3, January 2012, pg 70.    CLINDAMYCIN Value in next row Resistant      >=4 RESISTANTWARNING: For oxacillin-resistant S.aureus and coagulase-negative staphylococci (MRS), other beta-lactam agents, ie, penicillins, beta-lactam/beta-lactamase inhibitor combinations, cephems (with the exception of the cephalosporins with anti-MRSA activity), and carbapenems, may appear active in vitro, but are not effective clinically.  --CLSI, Vol.32 No.3, January 2012, pg 70.    LINEZOLID Value in next row Sensitive      SENSITIVE1    CEFAZOLIN Value in next row Resistant      SENSITIVE1    LEVOFLOXACIN Value in next row Resistant      RESISTANT>=8    * STAPHYLOCOCCUS EPIDERMIDIS  C difficile quick scan w PCR reflex     Status: Abnormal   Collection Time: 05/11/15  6:35 PM  Result Value Ref Range Status   C Diff antigen POSITIVE (A) NEGATIVE Final   C Diff toxin NEGATIVE NEGATIVE Final   C Diff interpretation   Final    Negative for toxigenic C. difficile. Toxin gene and active toxin production not detected. May be a nontoxigenic strain of C. difficile bacteria present, lacking the ability to produce toxin.  Clostridium Difficile by PCR     Status: None   Collection Time: 05/11/15  6:35 PM  Result Value Ref Range Status   Toxigenic C Difficile by pcr NEGATIVE NEGATIVE Final  Culture, blood (routine x 2)     Status: None  (Preliminary result)   Collection Time: 05/12/15  9:35 AM  Result Value Ref Range Status   Specimen Description BLOOD LEFT WRIST  Final   Special Requests BOTTLES DRAWN AEROBIC AND ANAEROBIC  3 CC  Final   Culture NO GROWTH < 24 HOURS  Final   Report Status PENDING  Incomplete  Culture, blood (routine x 2)     Status: None (Preliminary result)   Collection Time: 05/12/15  9:35 AM  Result Value Ref Range Status   Specimen Description BLOOD LEFT ASSIST CONTROL  Final   Special Requests BOTTLES DRAWN AEROBIC AND ANAEROBIC  5 CC  Final   Culture NO GROWTH < 24 HOURS  Final  Report Status PENDING  Incomplete    Coagulation Studies: No results for input(s): LABPROT, INR in the last 72 hours.  Urinalysis: No results for input(s): COLORURINE, LABSPEC, PHURINE, GLUCOSEU, HGBUR, BILIRUBINUR, KETONESUR, PROTEINUR, UROBILINOGEN, NITRITE, LEUKOCYTESUR in the last 72 hours.  Invalid input(s): APPERANCEUR    Imaging: No results found.   Medications:   . sodium chloride 75 mL/hr at 05/13/15 1355   . albumin human  12.5 g Intravenous BID  . antiseptic oral rinse  7 mL Mouth Rinse BID  . cefTRIAXone (ROCEPHIN)  IV  2 g Intravenous Q24H  . exemestane  25 mg Oral Daily  . insulin aspart  0-9 Units Subcutaneous TID AC & HS  . metoCLOPramide  10 mg Oral TID AC  . metroNIDAZOLE  500 mg Oral 3 times per day  . pantoprazole  40 mg Oral Daily  . rOPINIRole  1 mg Oral QHS  . simvastatin  40 mg Oral QHS   albuterol, diazepam, liver oil-zinc oxide, morphine injection, ondansetron **OR** ondansetron (ZOFRAN) IV, oxyCODONE, sodium chloride  Assessment/ Plan:  Ms. Becky Roberts is a 81 y.o. white female with diabetes mellitus type 2, nonalcoholic liver cirrhosis, ascites, chronic kidney disease stage III, was admitted on 05/10/2015 with nausea, diarrhea. She has been diagnosed with SBP. Her blood culture is growing coag-negative staph which is resistant to multiple antibiotics . C. difficile  antigen is positive but C. difficile toxin is negative   1.Acute renal failure on chronic kidney disease stage III. Baseline creatinine from admission of 1.69, with eGFR of 27. Creatinien has jumped to 3.15 and now trended down to 2.97.  - Discontinue IV fluids. Patient seems to not be as intravascularly deplete. - Acute Renal failure from ATN - Renally dose all medications.  - No acute indication for dialysis at this time.   2. Spontaneous bacterial peritonitis K65.2. ID is following. Patient is being treated with broad-spectrum antibiotics including ceftriaxone and metronidazole. Now off vancomycin.   3. Anasarca: peripheral edema and ascites:  - Continue IV albumin for oncotic support.     LOS: [place], [place] 8/13/20169:17 AM

## 2015-05-15 LAB — GLUCOSE, CAPILLARY
GLUCOSE-CAPILLARY: 144 mg/dL — AB (ref 65–99)
Glucose-Capillary: 98 mg/dL (ref 65–99)
Glucose-Capillary: 133 mg/dL — ABNORMAL HIGH (ref 65–99)

## 2015-05-15 LAB — AMMONIA: Ammonia: 32 umol/L (ref 9–35)

## 2015-05-15 LAB — BASIC METABOLIC PANEL
Anion gap: 5 (ref 5–15)
BUN: 28 mg/dL — AB (ref 6–20)
CO2: 27 mmol/L (ref 22–32)
Calcium: 7.9 mg/dL — ABNORMAL LOW (ref 8.9–10.3)
Chloride: 106 mmol/L (ref 101–111)
Creatinine, Ser: 2.9 mg/dL — ABNORMAL HIGH (ref 0.44–1.00)
GFR calc Af Amer: 16 mL/min — ABNORMAL LOW (ref >60)
GFR, EST NON AFRICAN AMERICAN: 14 mL/min — AB (ref >60)
GLUCOSE: 112 mg/dL — AB (ref 65–99)
Potassium: 3.2 mmol/L — ABNORMAL LOW (ref 3.5–5.1)
SODIUM: 138 mmol/L (ref 135–145)

## 2015-05-15 LAB — MAGNESIUM: Magnesium: 2.3 mg/dL (ref 1.7–2.4)

## 2015-05-15 MED ORDER — POTASSIUM CHLORIDE CRYS ER 20 MEQ PO TBCR
40.0000 meq | EXTENDED_RELEASE_TABLET | Freq: Once | ORAL | Status: AC
  Administered 2015-05-15: 40 meq via ORAL
  Filled 2015-05-15: qty 2

## 2015-05-15 NOTE — Progress Notes (Signed)
A&O but sometimes confused to situation. Forgetful. Bedbound. Incontinent. One loose stool during the night. IV albumin given. On 1L O2. No complaints.

## 2015-05-15 NOTE — Progress Notes (Signed)
[facility]  ROUNDING NOTE   Subjective:   Patient laying in bed. States she is urinating more.  Potassium replaced this morning.    Objective:  Vital signs in last 24 hours:  Temp:  [98.1 F (36.7 C)-98.2 F (36.8 C)] 98.2 F (36.8 C) (08/14 0857) Pulse Rate:  [64-83] 83 (08/14 0857) Resp:  [18-20] 20 (08/14 0857) BP: (116-131)/(46-55) 128/46 mmHg (08/14 0857) SpO2:  [98 %-99 %] 99 % (08/14 0857) Weight:  [92.035 kg (202 lb 14.4 oz)] 92.035 kg (202 lb 14.4 oz) (08/14 0507)  Weight change: -0.115 kg (-4.1 oz) Filed Weights   05/13/15 0520 05/14/15 0417 05/15/15 0507  Weight: 91.989 kg (202 lb 12.8 oz) 92.15 kg (203 lb 2.5 oz) 92.035 kg (202 lb 14.4 oz)    Intake/Output: I/O last 3 completed shifts: In: 1306.3 [I.V.:956.3; IV Piggyback:350] Out: 0    Intake/Output this shift:     Physical Exam: General: NAD  Head: Normocephalic, atraumatic. Moist oral mucosal membranes  Eyes: Anicteric, PERRL  Neck: Supple, trachea midline  Lungs:  Clear to auscultation  Heart: Regular rate and rhythm  Abdomen:  +distended, +ascites, tender to palpation  Extremities:  1+peripheral and dependent edema.  Neurologic: Nonfocal, moving all four extremities  Skin: No lesions       Basic Metabolic Panel:  Recent Labs Lab 05/11/15 0426 05/13/15 0724 05/13/15 1722 05/14/15 0403 05/15/15 0405 05/15/15 0923  NA 142 139 137 138 138  --   K 3.2* 3.6 3.9 3.4* 3.2*  --   CL 109 106 107 108 106  --   CO2 _0 --   GLUCOSE 102* 93 117* 106* 112*  --   BUN 13 28* 29* 29* 28*  --   CREATININE 1.69* 3.15* 3.02* 2.97* 2.90*  --   CALCIUM 7.5* 7.4* 7.6* 7.3* 7.9*  --   MG 1.3* 1.4*  --   --   --  2.3  PHOS  --   --   --  3.6  --   --     Liver Function Tests:  Recent Labs Lab 05/10/15 1524 05/14/15 0403  AST 46* 42*  ALT 17 16  ALKPHOS 80 63  BILITOT 2.0* 0.8  PROT 5.3* 4.7*  ALBUMIN 2.1* 1.9*    Recent Labs Lab 05/10/15 1554  LIPASE 34     Recent Labs Lab 05/15/15 0923  AMMONIA 32    CBC:  Recent Labs Lab 05/10/15 1524 05/11/15 0426 05/13/15 0724 05/14/15 0403  WBC 4.7 9.4 8.1  --   HGB 13.4 10.9* 9.5* 10.0*  HCT 40.2 32.8* 28.8*  --   MCV 96.2 96.2 96.6  --   PLT 142* 98* 82*  --     Cardiac Enzymes:  Recent Labs Lab 05/10/15 1554  TROPONINI <0.03    BNP: Invalid input(s): POCBNP  CBG:  Recent Labs Lab 05/13/15 2026 05/14/15 0749 05/14/15 1204 05/14/15 2038 05/15/15 0754  GLUCAP 135* 98 158* 145* 98    Microbiology: Results for orders placed or performed during the hospital encounter of 05/10/15  Body fluid culture     Status: None   Collection Time: 05/10/15  6:30 PM  Result Value Ref Range Status   Specimen Description PERITONEAL  Final   Special Requests Normal  Final   Gram Stain MANY WBC SEEN NO ORGANISMS SEEN   Final   Culture NO GROWTH 4 DAYS  Final   Report Status 05/14/2015 FINAL  Final  Blood  culture (routine x 2)     Status: None   Collection Time: 05/10/15  6:58 PM  Result Value Ref Range Status   Specimen Description BLOOD LEFT ANTECUBITAL  Final   Special Requests BOTTLES DRAWN AEROBIC AND ANAEROBIC 5ML  Final   Culture  Setup Time   Final    GRAM POSITIVE COCCI IN BOTH AEROBIC AND ANAEROBIC BOTTLES PREVIOUSLY CALLED TO DOLL FERGUSON AT 4599 05/11/15 BY TB CONFIRMED BY TB    Culture   Final    STAPHYLOCOCCUS EPIDERMIDIS IN BOTH AEROBIC AND ANAEROBIC BOTTLES WARNING: For oxacillin-resistant S.aureus and coagulase-negative staphylococci (MRS), other beta-lactam agents, ie, penicillins, beta-lactam/beta-lactamase inhibitor combinations, cephems (with the exception of the cephalosporins with anti-MRSA  activity), and carbapenems, may appear active in vitro, but are not effective clinically.  --CLSI, Vol.32 No.3, January 2012, pg 70.    Report Status 05/13/2015 FINAL  Final   Organism ID, Bacteria STAPHYLOCOCCUS EPIDERMIDIS  Final      Susceptibility    Staphylococcus epidermidis - MIC*    CIPROFLOXACIN >=8 RESISTANT Resistant     ERYTHROMYCIN >=8 RESISTANT Resistant     GENTAMICIN <=0.5 SENSITIVE Sensitive     OXACILLIN Value in next row Resistant      >=4 RESISTANTWARNING: For oxacillin-resistant S.aureus and coagulase-negative staphylococci (MRS), other beta-lactam agents, ie, penicillins, beta-lactam/beta-lactamase inhibitor combinations, cephems (with the exception of the cephalosporins with anti-MRSA activity), and carbapenems, may appear active in vitro, but are not effective clinically.  --CLSI, Vol.32 No.3, January 2012, pg 70.    TETRACYCLINE Value in next row Sensitive      >=4 RESISTANTWARNING: For oxacillin-resistant S.aureus and coagulase-negative staphylococci (MRS), other beta-lactam agents, ie, penicillins, beta-lactam/beta-lactamase inhibitor combinations, cephems (with the exception of the cephalosporins with anti-MRSA activity), and carbapenems, may appear active in vitro, but are not effective clinically.  --CLSI, Vol.32 No.3, January 2012, pg 70.    VANCOMYCIN Value in next row Sensitive      >=4 RESISTANTWARNING: For oxacillin-resistant S.aureus and coagulase-negative staphylococci (MRS), other beta-lactam agents, ie, penicillins, beta-lactam/beta-lactamase inhibitor combinations, cephems (with the exception of the cephalosporins with anti-MRSA activity), and carbapenems, may appear active in vitro, but are not effective clinically.  --CLSI, Vol.32 No.3, January 2012, pg 70.    CLINDAMYCIN Value in next row Sensitive      >=4 RESISTANTWARNING: For oxacillin-resistant S.aureus and coagulase-negative staphylococci (MRS), other beta-lactam agents, ie, penicillins, beta-lactam/beta-lactamase inhibitor combinations, cephems (with the exception of the cephalosporins with anti-MRSA activity), and carbapenems, may appear active in vitro, but are not effective clinically.  --CLSI, Vol.32 No.3, January 2012, pg 70.    LINEZOLID Value in  next row Sensitive      SENSITIVE2    CEFAZOLIN Value in next row Resistant      SENSITIVE2    * STAPHYLOCOCCUS EPIDERMIDIS  Blood culture (routine x 2)     Status: None   Collection Time: 05/10/15  6:58 PM  Result Value Ref Range Status   Specimen Description BLOOD LEFT WRIST  Final   Special Requests BOTTLES DRAWN AEROBIC AND ANAEROBIC 2ML  Final   Culture  Setup Time   Final    GRAM POSITIVE COCCI ANAEROBIC BOTTLE ONLY CRITICAL RESULT CALLED TO, READ BACK BY AND VERIFIED WITH: DOLL FERGUSON ON 05/11/15 AT 44 PM BY TB. CONFIRMED BY TB/SDR    Culture   Final    STAPHYLOCOCCUS EPIDERMIDIS WARNING: For oxacillin-resistant S.aureus and coagulase-negative staphylococci (MRS), other beta-lactam agents, ie, penicillins, beta-lactam/beta-lactamase inhibitor combinations, cephems (with the exception of  the cephalosporins with anti-MRSA  activity), and carbapenems, may appear active in vitro, but are not effective clinically.  --CLSI, Vol.32 No.3, January 2012, pg 70.    Report Status 05/14/2015 FINAL  Final   Organism ID, Bacteria STAPHYLOCOCCUS EPIDERMIDIS  Final      Susceptibility   Staphylococcus epidermidis - MIC*    CIPROFLOXACIN >=8 RESISTANT Resistant     ERYTHROMYCIN >=8 RESISTANT Resistant     GENTAMICIN >=16 RESISTANT Resistant     OXACILLIN Value in next row Resistant      >=4 RESISTANTWARNING: For oxacillin-resistant S.aureus and coagulase-negative staphylococci (MRS), other beta-lactam agents, ie, penicillins, beta-lactam/beta-lactamase inhibitor combinations, cephems (with the exception of the cephalosporins with anti-MRSA activity), and carbapenems, may appear active in vitro, but are not effective clinically.  --CLSI, Vol.32 No.3, January 2012, pg 70.    TETRACYCLINE Value in next row Resistant      >=4 RESISTANTWARNING: For oxacillin-resistant S.aureus and coagulase-negative staphylococci (MRS), other beta-lactam agents, ie, penicillins, beta-lactam/beta-lactamase inhibitor  combinations, cephems (with the exception of the cephalosporins with anti-MRSA activity), and carbapenems, may appear active in vitro, but are not effective clinically.  --CLSI, Vol.32 No.3, January 2012, pg 70.    VANCOMYCIN Value in next row Sensitive      >=4 RESISTANTWARNING: For oxacillin-resistant S.aureus and coagulase-negative staphylococci (MRS), other beta-lactam agents, ie, penicillins, beta-lactam/beta-lactamase inhibitor combinations, cephems (with the exception of the cephalosporins with anti-MRSA activity), and carbapenems, may appear active in vitro, but are not effective clinically.  --CLSI, Vol.32 No.3, January 2012, pg 70.    CLINDAMYCIN Value in next row Resistant      >=4 RESISTANTWARNING: For oxacillin-resistant S.aureus and coagulase-negative staphylococci (MRS), other beta-lactam agents, ie, penicillins, beta-lactam/beta-lactamase inhibitor combinations, cephems (with the exception of the cephalosporins with anti-MRSA activity), and carbapenems, may appear active in vitro, but are not effective clinically.  --CLSI, Vol.32 No.3, January 2012, pg 70.    LINEZOLID Value in next row Sensitive      SENSITIVE1    CEFAZOLIN Value in next row Resistant      SENSITIVE1    LEVOFLOXACIN Value in next row Resistant      RESISTANT>=8    * STAPHYLOCOCCUS EPIDERMIDIS  C difficile quick scan w PCR reflex     Status: Abnormal   Collection Time: 05/11/15  6:35 PM  Result Value Ref Range Status   C Diff antigen POSITIVE (A) NEGATIVE Final   C Diff toxin NEGATIVE NEGATIVE Final   C Diff interpretation   Final    Negative for toxigenic C. difficile. Toxin gene and active toxin production not detected. May be a nontoxigenic strain of C. difficile bacteria present, lacking the ability to produce toxin.  Clostridium Difficile by PCR     Status: None   Collection Time: 05/11/15  6:35 PM  Result Value Ref Range Status   Toxigenic C Difficile by pcr NEGATIVE NEGATIVE Final  Culture, blood  (routine x 2)     Status: None (Preliminary result)   Collection Time: 05/12/15  9:35 AM  Result Value Ref Range Status   Specimen Description BLOOD LEFT WRIST  Final   Special Requests BOTTLES DRAWN AEROBIC AND ANAEROBIC  3 CC  Final   Culture NO GROWTH 3 DAYS  Final   Report Status PENDING  Incomplete  Culture, blood (routine x 2)     Status: None (Preliminary result)   Collection Time: 05/12/15  9:35 AM  Result Value Ref Range Status   Specimen Description BLOOD LEFT ASSIST CONTROL  Final   Special Requests BOTTLES DRAWN AEROBIC AND ANAEROBIC  5 CC  Final   Culture NO GROWTH 3 DAYS  Final   Report Status PENDING  Incomplete    Coagulation Studies: No results for input(s): LABPROT, INR in the last 72 hours.  Urinalysis: No results for input(s): COLORURINE, LABSPEC, PHURINE, GLUCOSEU, HGBUR, BILIRUBINUR, KETONESUR, PROTEINUR, UROBILINOGEN, NITRITE, LEUKOCYTESUR in the last 72 hours.  Invalid input(s): APPERANCEUR    Imaging: No results found.   Medications:     . albumin human  12.5 g Intravenous BID  . antiseptic oral rinse  7 mL Mouth Rinse BID  . exemestane  25 mg Oral Daily  . insulin aspart  0-9 Units Subcutaneous TID AC & HS  . metroNIDAZOLE  500 mg Oral 3 times per day  . pantoprazole  40 mg Oral Daily  . rOPINIRole  1 mg Oral QHS  . simvastatin  40 mg Oral QHS   albuterol, diazepam, liver oil-zinc oxide, morphine injection, ondansetron **OR** ondansetron (ZOFRAN) IV, oxyCODONE, sodium chloride  Assessment/ Plan:  Ms. Becky Roberts is a 81 y.o. white female with diabetes mellitus type 2, nonalcoholic liver cirrhosis, ascites, chronic kidney disease stage III, was admitted on 05/10/2015 with nausea, diarrhea. She has been diagnosed with SBP. Her blood culture is growing coag-negative staph which is resistant to multiple antibiotics . C. difficile antigen is positive but C. difficile toxin is negative   1.Acute renal failure on chronic kidney disease stage  III. Acute renal failure most consistent with ATN. Baseline creatinine from admission of 1.69, with eGFR of 27. Creatinine has jumped to 3.15 and now trended down.  - Discontinued IV fluids. Patient seems to not be as intravascularly deplete. She is consuming more PO - Renally dose all medications.  - No acute indication for dialysis at this time.   2. Spontaneous bacterial peritonitis K65.2. ID is following. Patient is being treated with broad-spectrum antibiotics including ceftriaxone and metronidazole. Now off vancomycin.   3. Anasarca: peripheral edema and ascites: improved on examination today.  - Continue IV albumin for oncotic support.     LOS: 5 KOLLURU, SARATH 8/14/201610:11 AM

## 2015-05-15 NOTE — Progress Notes (Signed)
[facility] [place] NAME: Becky Roberts    MR#:  [id number]  DATE OF BIRTH:  09/30/1932  SUBJECTIVE:  CHIEF COMPLAINT:   Chief Complaint  Patient presents with  . Nausea  . Emesis  . Diarrhea   Patient is 81 year old Caucasian female with history of liver cirrhosis, ascites, who presents to the hospital with complaints of nausea, vomiting and diarrhea as well as abdominal pain. She underwent peritoneal tap yesterday on admission which revealed the more than 12,000 white blood cells, 88% of them were neutrophils signifying spontaneous bacterial peritonitis, patient was initiated on broad-spectrum antibiotic therapy with Rocephin and vancomycin. Patient admits of nausea, however, denies any more vomiting, admits of diarrheal stool, admits of abdominal pain radiating to from one side of abdomen to other as well as in pain in the lower abdomen, more on the right side. CT of abdomen and pelvis revealed liver cirrhosis, ascites distention of jejunal loops suggested enteritis Blood culture taken on 05/10/2015 showed gram-positive cocci, stool for C. difficile revealed no C. difficile toxin, however, positive for C. difficile antigen. Patient had significant diarrhea with green stool liquidy yesterday and was hypotensive. Stool frequency has decreased and patient did not have bowel movements overnight. Blood pressure has improved , kidney function slightly improved. Now patient is on albumin IV per nephrology consultation. Patient feels poorly. Denies any pain. Feels weak . Complains of finger swelling, admits of orthopnea Today, 14th of June 02, 2015 patient feels a little bit better. Complains of swelling in lower extremity, abdomen, and shortness of breath. Kidney function is improving on albumin IV. Nephrologist recommends to stop albumin because of fluid overload. Oral intake has improved and diarrhea is subsiding, although, still present. Blood  pressure has improved Review of Systems  Constitutional: Positive for chills and malaise/fatigue. Negative for fever and weight loss.  HENT: Negative for congestion.   Eyes: Negative for blurred vision and double vision.  Respiratory: Negative for cough, sputum production, shortness of breath and wheezing.   Cardiovascular: Negative for chest pain, palpitations, orthopnea, leg swelling and PND.  Gastrointestinal: Positive for abdominal pain and diarrhea. Negative for nausea, vomiting, constipation and blood in stool.  Genitourinary: Negative for dysuria, urgency, frequency and hematuria.  Musculoskeletal: Negative for falls.  Neurological: Negative for dizziness, tremors, focal weakness and headaches.  Endo/Heme/Allergies: Does not bruise/bleed easily.  Psychiatric/Behavioral: Negative for depression. The patient does not have insomnia.     VITAL SIGNS: Blood pressure 114/49, pulse 77, temperature 98.1 F (36.7 C), temperature source Oral, resp. rate 18, height 5\' 2"  (1.575 m), weight 92.035 kg (202 lb 14.4 oz), SpO2 100 %.  PHYSICAL EXAMINATION:   GENERAL:  81 y.o.-year-old patient lying in the bed with no acute distress. Better hydrated. Diffuse anasarca. Mildly shortened short of breath and tachypneic EYES: Pupils equal, round, reactive to light and accommodation. No scleral icterus. Extraocular muscles intact.  HEENT: Head atraumatic, normocephalic. Oropharynx and nasopharynx clear.  NECK:  Supple, no jugular venous distention. No thyroid enlargement, no tenderness.  LUNGS: Normal breath sounds bilaterally, no wheezing, rales,rhonchi or crepitation. No use of accessory muscles of respiration.  CARDIOVASCULAR: S1, S2 normal. No murmurs, rubs, or gallops.  ABDOMEN: Soft, no discomfort on palpation diffusely. Fluid wave was felt No discrete areas of disc of pain was noted. No rebound or guarding , nondistended. Bowel sounds present. No organomegaly or mass.  EXTREMITIES: No pedal edema,  cyanosis, or clubbing.  NEUROLOGIC: Cranial nerves II through XII  are intact. Muscle strength 5/5 in all extremities. Sensation intact. Gait not checked.  PSYCHIATRIC: The patient is alert and oriented x 3.  SKIN: No obvious rash, lesion, or ulcer. Diffuse anasarca  ORDERS/RESULTS REVIEWED:   CBC  Recent Labs Lab 05/10/15 1524 05/11/15 0426 05/13/15 0724 05/14/15 0403  WBC 4.7 9.4 8.1  --   HGB 13.4 10.9* 9.5* 10.0*  HCT 40.2 32.8* 28.8*  --   PLT 142* 98* 82*  --   MCV 96.2 96.2 96.6  --   MCH 32.1 32.0 31.7  --   MCHC 33.4 33.3 32.8  --   RDW 15.9* 16.0* 16.4*  --    ------------------------------------------------------------------------------------------------------------------  Chemistries   Recent Labs Lab 05/10/15 1524 05/11/15 0426 05/13/15 0724 05/13/15 1722 05/14/15 0403 05/15/15 0405 05/15/15 0923  NA 141 142 139 137 138 138  --   K 3.3* 3.2* 3.6 3.9 3.4* 3.2*  --   CL 105 109 106 107 108 106  --   CO2 28 25 26 26 26 27   --   GLUCOSE 122* 102* 93 117* 106* 112*  --   BUN 9 13 28* 29* 29* 28*  --   CREATININE 1.35* 1.69* 3.15* 3.02* 2.97* 2.90*  --   CALCIUM 8.1* 7.5* 7.4* 7.6* 7.3* 7.9*  --   MG  --  1.3* 1.4*  --   --   --  2.3  AST 46*  --   --   --  42*  --   --   ALT 17  --   --   --  16  --   --   ALKPHOS 80  --   --   --  63  --   --   BILITOT 2.0*  --   --   --  0.8  --   --    ------------------------------------------------------------------------------------------------------------------ estimated creatinine clearance is 16.1 mL/min (by C-G formula based on Cr of 2.9). ------------------------------------------------------------------------------------------------------------------ No results for input(s): TSH, T4TOTAL, T3FREE, THYROIDAB in the last 72 hours.  Invalid input(s): FREET3  Cardiac Enzymes  Recent Labs Lab 05/10/15 [place] <0.03    ------------------------------------------------------------------------------------------------------------------ Invalid input(s): POCBNP ---------------------------------------------------------------------------------------------------------------  RADIOLOGY: No results found.  EKG:  Orders placed or performed during the hospital encounter of 05/10/15  . EKG 12-Lead  . 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 Spontaneous bacterial peritonitis, completed Rocephin 6 day course as recommended by Dr. Ola Spurr ,  clinically improved 2. Bacteremia due to Staphylococcus aureus epidermidis, likely contamination ,  repeated blood cultures taken 11th of August 2016 are negative so far. Of vancomycin. Echocardiogram was unremarkable with no valvular involvement , appreciate infectious diseases input. Patient is stable clinically 3. Acute on Chronic renal failure, likely ATN due to hypotension with diarrhea, UA was unremarkable, not suggestive of urinary tract infection, received few days of albumin infusion, kidney function is slightly better than yesterday.  blood pressure has improved. BMP tomorrow morning. Appreciate nephrology input. Discussed with Dr. Juleen China. Palliative care consultation on Monday 4. Hypokalemia, supplemented orally. low magnesium level, supplemented IV 5. Ascites. Holding diuretics, Lasix as well as spironolactone due to diarrhea, renal failure 6.  C. difficile enterocolitis, continue soft diet  , continue Flagyl orally, will need to continue 14 day therapy after last dose of Rocephin, which is through 05/29/2015, diarrhea is subsiding.   Management plans  discussed with the patient, Dr. Juleen China and they are in agreement.   DRUG ALLERGIES:  Allergies  Allergen Reactions  . Sulfa Antibiotics Hives  . Tylenol [Acetaminophen] Other (See Comments)    Has cirrhosis of the liver secondary to Tylenol use.  . Vioxx [Rofecoxib] Hives and Itching  . Aspirin Palpitations    CODE STATUS:     Code Status Orders        Start     Ordered   05/10/15 2254  Do not attempt resuscitation (DNR)   Continuous    Question Answer Comment  In the event of cardiac or respiratory ARREST Do not call a "code blue"   In the event of cardiac or respiratory ARREST Do not perform Intubation, CPR, defibrillation or ACLS   In the event of cardiac or respiratory ARREST Use medication by any route, position, wound care, and other measures to relive pain and suffering. May use oxygen, suction and manual treatment of airway obstruction as needed for comfort.      05/10/15 2253      TOTAL TIME TAKING CARE OF THIS PATIENT: 40 minutes.  Discussed with Dr. Charlesetta Shanks M.D on 05/15/2015 at 1:34 PM  Between 7am to 6pm - Pager - [phone]  After 6pm go to [email or web address] - password [email or web address] [facility]  [facility]  Office  [phone]  CC: Primary care physician; No primary care provider on file.

## 2015-05-16 DIAGNOSIS — E440 Moderate protein-calorie malnutrition: Secondary | ICD-10-CM | POA: Insufficient documentation

## 2015-05-16 LAB — GLUCOSE, CAPILLARY
GLUCOSE-CAPILLARY: 92 mg/dL (ref 65–99)
Glucose-Capillary: 132 mg/dL — ABNORMAL HIGH (ref 65–99)
Glucose-Capillary: 142 mg/dL — ABNORMAL HIGH (ref 65–99)
Glucose-Capillary: 134 mg/dL — ABNORMAL HIGH (ref 65–99)

## 2015-05-16 LAB — CREATININE, SERUM
Creatinine, Ser: 2.72 mg/dL — ABNORMAL HIGH (ref 0.44–1.00)
GFR calc Af Amer: 18 mL/min — ABNORMAL LOW (ref >60)
GFR calc non Af Amer: 15 mL/min — ABNORMAL LOW (ref >60)

## 2015-05-16 MED ORDER — ENSURE ENLIVE PO LIQD
237.0000 mL | Freq: Two times a day (BID) | ORAL | Status: DC
  Administered 2015-05-16 – 2015-05-19 (×5): 237 mL via ORAL

## 2015-05-16 NOTE — Progress Notes (Signed)
[facility]  ROUNDING NOTE   Subjective:  Cr trending down slowly. Currently 2.7.  K was low yesterday at 3.2   Objective:  Vital signs in last 24 hours:  Temp:  [97.8 F (36.6 C)-98.1 F (36.7 C)] 97.8 F (36.6 C) (08/15 0814) Pulse Rate:  [78-88] 78 (08/15 0814) Resp:  [16-18] 18 (08/15 0814) BP: (121-128)/(50-62) 121/51 mmHg (08/15 0814) SpO2:  [97 %-100 %] 97 % (08/15 1110) Weight:  [91.672 kg (202 lb 1.6 oz)-93.078 kg (205 lb 3.2 oz)] 93.078 kg (205 lb 3.2 oz) (08/15 0540)  Weight change: -0.363 kg (-12.8 oz) Filed Weights   05/15/15 0507 05/16/15 0500 05/16/15 0540  Weight: 92.035 kg (202 lb 14.4 oz) 91.672 kg (202 lb 1.6 oz) 93.078 kg (205 lb 3.2 oz)    Intake/Output: I/O last 3 completed shifts: In: 390 [P.O.:240; IV Piggyback:150] Out: 0    Intake/Output this shift:  Total I/O In: 120 [P.O.:120] Out: 3 [Urine:3]  Physical Exam: General: NAD  Head: Normocephalic, atraumatic. Moist oral mucosal membranes  Eyes: Anicteric  Neck: Supple, trachea midline  Lungs:  Clear to auscultation normal effort  Heart: Regular rate and rhythm  Abdomen:  +distended, +ascites, non tender  Extremities:  1+peripheral and dependent edema.  Neurologic: Nonfocal, moving all four extremities  Skin: No lesions       Basic Metabolic Panel:  Recent Labs Lab 05/11/15 0426 05/13/15 0724 05/13/15 1722 05/14/15 0403 05/15/15 0405 05/15/15 0923 05/16/15 0613  NA 142 139 137 138 138  --   --   K 3.2* 3.6 3.9 3.4* 3.2*  --   --   CL 109 106 107 108 106  --   --   CO2 25 26 26 26 27   --   --   GLUCOSE 102* 93 117* 106* 112*  --   --   BUN 13 28* 29* 29* 28*  --   --   CREATININE 1.69* 3.15* 3.02* 2.97* 2.90*  --  2.72*  CALCIUM 7.5* 7.4* 7.6* 7.3* 7.9*  --   --   MG 1.3* 1.4*  --   --   --  2.3  --   PHOS  --   --   --  3.6  --   --   --     Liver Function Tests:  Recent Labs Lab 05/10/15 1524 05/14/15 0403  AST 46* 42*  ALT 17 16  ALKPHOS 80 63   BILITOT 2.0* 0.8  PROT 5.3* 4.7*  ALBUMIN 2.1* 1.9*    Recent Labs Lab 05/10/15 1554  LIPASE 34    Recent Labs Lab 05/15/15 0923  AMMONIA 32    CBC:  Recent Labs Lab 05/10/15 1524 05/11/15 0426 05/13/15 0724 05/14/15 0403  WBC 4.7 9.4 8.1  --   HGB 13.4 10.9* 9.5* 10.0*  HCT 40.2 32.8* 28.8*  --   MCV 96.2 96.2 96.6  --   PLT 142* 98* 82*  --     Cardiac Enzymes:  Recent Labs Lab 05/10/15 1554  TROPONINI <0.03    BNP: Invalid input(s): POCBNP  CBG:  Recent Labs Lab 05/15/15 1219 05/15/15 1608 05/16/15 0712 05/16/15 1127 05/16/15 1616  GLUCAP 133* 144* 92 132* 142*    Microbiology: Results for orders placed or performed during the hospital encounter of 05/10/15  Body fluid culture     Status: None   Collection Time: 05/10/15  6:30 PM  Result Value Ref Range Status   Specimen Description PERITONEAL  Final  Special Requests Normal  Final   Gram Stain MANY WBC SEEN NO ORGANISMS SEEN   Final   Culture NO GROWTH 4 DAYS  Final   Report Status 05/14/2015 FINAL  Final  Blood culture (routine x 2)     Status: None   Collection Time: 05/10/15  6:58 PM  Result Value Ref Range Status   Specimen Description BLOOD LEFT ANTECUBITAL  Final   Special Requests BOTTLES DRAWN AEROBIC AND ANAEROBIC 5ML  Final   Culture  Setup Time   Final    GRAM POSITIVE COCCI IN BOTH AEROBIC AND ANAEROBIC BOTTLES PREVIOUSLY CALLED TO DOLL FERGUSON AT 8756 05/11/15 BY TB CONFIRMED BY TB    Culture   Final    STAPHYLOCOCCUS EPIDERMIDIS IN BOTH AEROBIC AND ANAEROBIC BOTTLES WARNING: For oxacillin-resistant S.aureus and coagulase-negative staphylococci (MRS), other beta-lactam agents, ie, penicillins, beta-lactam/beta-lactamase inhibitor combinations, cephems (with the exception of the cephalosporins with anti-MRSA  activity), and carbapenems, may appear active in vitro, but are not effective clinically.  --CLSI, Vol.32 No.3, January 2012, pg 70.    Report Status  05/13/2015 FINAL  Final   Organism ID, Bacteria STAPHYLOCOCCUS EPIDERMIDIS  Final      Susceptibility   Staphylococcus epidermidis - MIC*    CIPROFLOXACIN >=8 RESISTANT Resistant     ERYTHROMYCIN >=8 RESISTANT Resistant     GENTAMICIN <=0.5 SENSITIVE Sensitive     OXACILLIN Value in next row Resistant      >=4 RESISTANTWARNING: For oxacillin-resistant S.aureus and coagulase-negative staphylococci (MRS), other beta-lactam agents, ie, penicillins, beta-lactam/beta-lactamase inhibitor combinations, cephems (with the exception of the cephalosporins with anti-MRSA activity), and carbapenems, may appear active in vitro, but are not effective clinically.  --CLSI, Vol.32 No.3, January 2012, pg 70.    TETRACYCLINE Value in next row Sensitive      >=4 RESISTANTWARNING: For oxacillin-resistant S.aureus and coagulase-negative staphylococci (MRS), other beta-lactam agents, ie, penicillins, beta-lactam/beta-lactamase inhibitor combinations, cephems (with the exception of the cephalosporins with anti-MRSA activity), and carbapenems, may appear active in vitro, but are not effective clinically.  --CLSI, Vol.32 No.3, January 2012, pg 70.    VANCOMYCIN Value in next row Sensitive      >=4 RESISTANTWARNING: For oxacillin-resistant S.aureus and coagulase-negative staphylococci (MRS), other beta-lactam agents, ie, penicillins, beta-lactam/beta-lactamase inhibitor combinations, cephems (with the exception of the cephalosporins with anti-MRSA activity), and carbapenems, may appear active in vitro, but are not effective clinically.  --CLSI, Vol.32 No.3, January 2012, pg 70.    CLINDAMYCIN Value in next row Sensitive      >=4 RESISTANTWARNING: For oxacillin-resistant S.aureus and coagulase-negative staphylococci (MRS), other beta-lactam agents, ie, penicillins, beta-lactam/beta-lactamase inhibitor combinations, cephems (with the exception of the cephalosporins with anti-MRSA activity), and carbapenems, may appear active in  vitro, but are not effective clinically.  --CLSI, Vol.32 No.3, January 2012, pg 70.    LINEZOLID Value in next row Sensitive      SENSITIVE2    CEFAZOLIN Value in next row Resistant      SENSITIVE2    * STAPHYLOCOCCUS EPIDERMIDIS  Blood culture (routine x 2)     Status: None   Collection Time: 05/10/15  6:58 PM  Result Value Ref Range Status   Specimen Description BLOOD LEFT WRIST  Final   Special Requests BOTTLES DRAWN AEROBIC AND ANAEROBIC 2ML  Final   Culture  Setup Time   Final    GRAM POSITIVE COCCI ANAEROBIC BOTTLE ONLY CRITICAL RESULT CALLED TO, READ BACK BY AND VERIFIED WITH: DOLL FERGUSON ON 05/11/15 AT 459 PM BY  TB. CONFIRMED BY TB/SDR    Culture   Final    STAPHYLOCOCCUS EPIDERMIDIS WARNING: For oxacillin-resistant S.aureus and coagulase-negative staphylococci (MRS), other beta-lactam agents, ie, penicillins, beta-lactam/beta-lactamase inhibitor combinations, cephems (with the exception of the cephalosporins with anti-MRSA  activity), and carbapenems, may appear active in vitro, but are not effective clinically.  --CLSI, Vol.32 No.3, January 2012, pg 70.    Report Status 05/14/2015 FINAL  Final   Organism ID, Bacteria STAPHYLOCOCCUS EPIDERMIDIS  Final      Susceptibility   Staphylococcus epidermidis - MIC*    CIPROFLOXACIN >=8 RESISTANT Resistant     ERYTHROMYCIN >=8 RESISTANT Resistant     GENTAMICIN >=16 RESISTANT Resistant     OXACILLIN Value in next row Resistant      >=4 RESISTANTWARNING: For oxacillin-resistant S.aureus and coagulase-negative staphylococci (MRS), other beta-lactam agents, ie, penicillins, beta-lactam/beta-lactamase inhibitor combinations, cephems (with the exception of the cephalosporins with anti-MRSA activity), and carbapenems, may appear active in vitro, but are not effective clinically.  --CLSI, Vol.32 No.3, January 2012, pg 70.    TETRACYCLINE Value in next row Resistant      >=4 RESISTANTWARNING: For oxacillin-resistant S.aureus and  coagulase-negative staphylococci (MRS), other beta-lactam agents, ie, penicillins, beta-lactam/beta-lactamase inhibitor combinations, cephems (with the exception of the cephalosporins with anti-MRSA activity), and carbapenems, may appear active in vitro, but are not effective clinically.  --CLSI, Vol.32 No.3, January 2012, pg 70.    VANCOMYCIN Value in next row Sensitive      >=4 RESISTANTWARNING: For oxacillin-resistant S.aureus and coagulase-negative staphylococci (MRS), other beta-lactam agents, ie, penicillins, beta-lactam/beta-lactamase inhibitor combinations, cephems (with the exception of the cephalosporins with anti-MRSA activity), and carbapenems, may appear active in vitro, but are not effective clinically.  --CLSI, Vol.32 No.3, January 2012, pg 70.    CLINDAMYCIN Value in next row Resistant      >=4 RESISTANTWARNING: For oxacillin-resistant S.aureus and coagulase-negative staphylococci (MRS), other beta-lactam agents, ie, penicillins, beta-lactam/beta-lactamase inhibitor combinations, cephems (with the exception of the cephalosporins with anti-MRSA activity), and carbapenems, may appear active in vitro, but are not effective clinically.  --CLSI, Vol.32 No.3, January 2012, pg 70.    LINEZOLID Value in next row Sensitive      SENSITIVE1    CEFAZOLIN Value in next row Resistant      SENSITIVE1    LEVOFLOXACIN Value in next row Resistant      RESISTANT>=8    * STAPHYLOCOCCUS EPIDERMIDIS  C difficile quick scan w PCR reflex     Status: Abnormal   Collection Time: 05/11/15  6:35 PM  Result Value Ref Range Status   C Diff antigen POSITIVE (A) NEGATIVE Final   C Diff toxin NEGATIVE NEGATIVE Final   C Diff interpretation   Final    Negative for toxigenic C. difficile. Toxin gene and active toxin production not detected. May be a nontoxigenic strain of C. difficile bacteria present, lacking the ability to produce toxin.  Clostridium Difficile by PCR     Status: None   Collection Time: 05/11/15   6:35 PM  Result Value Ref Range Status   Toxigenic C Difficile by pcr NEGATIVE NEGATIVE Final  Culture, blood (routine x 2)     Status: None (Preliminary result)   Collection Time: 05/12/15  9:35 AM  Result Value Ref Range Status   Specimen Description BLOOD LEFT WRIST  Final   Special Requests BOTTLES DRAWN AEROBIC AND ANAEROBIC  3 CC  Final   Culture NO GROWTH 4 DAYS  Final   Report Status PENDING  Incomplete  Culture, blood (routine x 2)     Status: None (Preliminary result)   Collection Time: 05/12/15  9:35 AM  Result Value Ref Range Status   Specimen Description BLOOD LEFT ASSIST CONTROL  Final   Special Requests BOTTLES DRAWN AEROBIC AND ANAEROBIC  5 CC  Final   Culture NO GROWTH 4 DAYS  Final   Report Status PENDING  Incomplete    Coagulation Studies: No results for input(s): LABPROT, INR in the last 72 hours.  Urinalysis: No results for input(s): COLORURINE, LABSPEC, PHURINE, GLUCOSEU, HGBUR, BILIRUBINUR, KETONESUR, PROTEINUR, UROBILINOGEN, NITRITE, LEUKOCYTESUR in the last 72 hours.  Invalid input(s): APPERANCEUR    Imaging: No results found.   Medications:     . albumin human  12.5 g Intravenous BID  . antiseptic oral rinse  7 mL Mouth Rinse BID  . exemestane  25 mg Oral Daily  . feeding supplement (ENSURE ENLIVE)  237 mL Oral BID WC  . insulin aspart  0-9 Units Subcutaneous TID AC & HS  . metroNIDAZOLE  500 mg Oral 3 times per day  . pantoprazole  40 mg Oral Daily  . rOPINIRole  1 mg Oral QHS  . simvastatin  40 mg Oral QHS   albuterol, diazepam, liver oil-zinc oxide, morphine injection, ondansetron **OR** ondansetron (ZOFRAN) IV, oxyCODONE, sodium chloride  Assessment/ Plan:  Ms. TASFIA VASSEUR is a 81 y.o. white female with diabetes mellitus type 2, nonalcoholic liver cirrhosis, ascites, chronic kidney disease stage III, was admitted on 05/10/2015 with nausea, diarrhea. She has been diagnosed with SBP. Her blood culture is growing coag-negative staph  which is resistant to multiple antibiotics . C. difficile antigen is positive but C. difficile toxin is negative   1.Acute renal failure on chronic kidney disease stage III/IV. Acute renal failure most consistent with ATN. Baseline creatinine from admission of 1.69, with eGFR of 27.  - Cr slowly trending down, off IVFs at present.  Is able to take PO.  Would continue to monitor renal function daily.   2. Spontaneous bacterial peritonitis K65.2. ID is following. Patient is being treated with broad-spectrum antibiotics including ceftriaxone and metronidazole.  3. Generalized edema: peripheral edema and ascites: not on diuretics, receiving IV albumin to mobilize fluid into intravascular space.  LOS: 6 Larhonda Dettloff 8/15/20166:08 PM

## 2015-05-16 NOTE — Progress Notes (Addendum)
Initial Nutrition Assessment  DOCUMENTATION CODES:   Non-severe (moderate) malnutrition in context of acute illness/injury  INTERVENTION:   Meals and Snacks: Cater to patient preferences Medical Food Supplement Therapy: pt agreeable to trying supplement, recommend Ensure Enlive po BID, each supplement provides 350 kcal and 20 grams of protein   NUTRITION DIAGNOSIS:   Inadequate oral intake related to acute illness, poor appetite as evidenced by meal completion < 25%.  GOAL:   Patient will meet greater than or equal to 90% of their needs   MONITOR:    (Energy Intake, Anthropometrics, Electrolyte/Renal Profile, Digestive System)  REASON FOR ASSESSMENT:   LOS    ASSESSMENT:    Pt admitted with with bacterial peritonitis, c.diff enterocolits, , acute on CRF, acites  Past Medical History  Diagnosis Date  . Blood transfusion reaction   . Diabetes mellitus   . Depression   . Hypertension   . Anemia 2012    hgb 5.5, admitted,  workup multifactorial,  b12 deficiency  . Arthritis   . Asthma   . Portal hypertension February 2014    CT suggested a small nodular liver, multiple varices adjacent to the spleen and inferior mediastinum and paraesophageal region. No ascites.  . Breast cancer July 2,2013    T1c,N0,M0. ER + PR- , HER-2/neu not overexpressing..  . Cirrhosis   . Cholelithiases   . CKD (chronic kidney disease) stage 3, GFR 30-59 ml/min     Diet Order:  DIET SOFT Room service appropriate?: Yes; Fluid consistency:: Thin   Energy Intake: limited documentation of po intake as pt on Isolation; recorded po intake 22% on average. Pt had not eaten lunch today; reports she ate some breakfast. Reports appetite is poor  Food and Nutrition related history: pt reports appetite the same at home as it is here; not using any suppments  Electrolyte and Renal Profile:  Recent Labs Lab 05/11/15 0426 05/13/15 0724 05/13/15 1722 05/14/15 0403 05/15/15 0405 05/15/15 0923  05/16/15 0613  BUN 13 28* 29* 29* 28*  --   --   CREATININE 1.69* 3.15* 3.02* 2.97* 2.90*  --  2.72*  NA 142 139 137 138 138  --   --   K 3.2* 3.6 3.9 3.4* 3.2*  --   --   MG 1.3* 1.4*  --   --   --  2.3  --   PHOS  --   --   --  3.6  --   --   --    Glucose Profile:   Recent Labs  05/15/15 1608 05/16/15 0712 05/16/15 1127  GLUCAP 144* 92 132*   Protein Profile:   Recent Labs Lab 05/10/15 1524 05/14/15 0403  ALBUMIN 2.1* 1.9*   Nutritional Anemia Profile:  CBC Latest Ref Rng 05/14/2015 05/13/2015 05/11/2015  WBC 3.6 - 11.0 K/uL - 8.1 9.4  Hemoglobin 12.0 - 16.0 g/dL 10.0(L) 9.5(L) 10.9(L)  Hematocrit 35.0 - 47.0 % - 28.8(L) 32.8(L)  Platelets 150 - 440 K/uL - 82(L) 98(L)    Nutrition Focused physical exam:Nutrition-Focused physical exam completed. Findings are wdl for fat depletion, mild muscle depletion, and mild edema.   Meds: ss novolog  Height:   Ht Readings from Last 1 Encounters:  05/10/15 5' 2"  (1.575 m)    Weight: pt reports weight gain prior to admission due to fluid but reports she has lost some of that since admission  Wt Readings from Last 1 Encounters:  05/16/15 205 lb 3.2 oz (93.078 kg)   [employer]  05/15/15 0507 05/16/15 0500 05/16/15 0540  Weight: 202 lb 14.4 oz (92.035 kg) 202 lb 1.6 oz (91.672 kg) 205 lb 3.2 oz (93.078 kg)   Wt Readings from Last 10 Encounters:  05/16/15 205 lb 3.2 oz (93.078 kg)  04/18/15 218 lb 14.4 oz (99.292 kg)  02/18/14 190 lb (86.183 kg)  02/12/14 191 lb 8 oz (86.864 kg)  01/13/14 191 lb 8 oz (86.864 kg)  12/08/13 195 lb (88.451 kg)  08/17/13 190 lb (86.183 kg)  04/28/13 190 lb 4 oz (86.297 kg)  12/16/12 178 lb 8 oz (80.967 kg)  12/03/12 180 lb 4 oz (81.761 kg)     BMI:  Body mass index is 37.52 kg/(m^2).  Estimated Nutritional Needs:   Kcal:  1313-1551 kcals (BEE 918, 1.3 AF, 1.1-1.3 IF) using IBW 50 kg  Protein:  60-70 g (1.2-1.4 g/kg)   Fluid:  1250-1500 mL (25-30 ml/kg)    HIGH Care  Level   Kerman Passey MS, RD, LDN [phone] Pager

## 2015-05-16 NOTE — Progress Notes (Signed)
Dr. Ether Griffins made aware of vaginal bleeding, she will put GYN consult in.

## 2015-05-16 NOTE — Progress Notes (Signed)
Assisted back to bed, noted to have blood on pad in chair, when cleaning pt up noticed blood on washcloth when cleaning her vagina, small dark clot on washcloth.  Will page MD

## 2015-05-16 NOTE — Progress Notes (Signed)
[facility] PROGRESS NOTE Date of Admission:  05/10/2015     ID: Becky Roberts is a 81 y.o. female with SBP, ARF.  Principal Problem:   Sepsis Active Problems:   Type II diabetes mellitus   Hypertension   COPD (chronic obstructive pulmonary disease)   Breast cancer   Chronic diastolic congestive heart failure   Restless legs syndrome   Cirrhosis of liver not due to alcohol   SBP (spontaneous bacterial peritonitis)   Subjective: Diarrhea persists but less so. No fevers. Off ceftriaxone  ROS  Eleven systems are reviewed and negative except per hpi  Medications:  Antibiotics Given (last 72 hours)    Date/Time Action Medication Dose   05/13/15 2107 Given   metroNIDAZOLE (FLAGYL) tablet 500 mg 500 mg   05/14/15 0526 Given   metroNIDAZOLE (FLAGYL) tablet 500 mg 500 mg   05/14/15 1214 Given   metroNIDAZOLE (FLAGYL) tablet 500 mg 500 mg   05/14/15 2305 Given   metroNIDAZOLE (FLAGYL) tablet 500 mg 500 mg   05/15/15 0537 Given   metroNIDAZOLE (FLAGYL) tablet 500 mg 500 mg   05/15/15 1432 Given   metroNIDAZOLE (FLAGYL) tablet 500 mg 500 mg   05/15/15 2156 Given   metroNIDAZOLE (FLAGYL) tablet 500 mg 500 mg   05/16/15 0557 Given   metroNIDAZOLE (FLAGYL) tablet 500 mg 500 mg   05/16/15 1337 Given   metroNIDAZOLE (FLAGYL) tablet 500 mg 500 mg     . albumin human  12.5 g Intravenous BID  . antiseptic oral rinse  7 mL Mouth Rinse BID  . exemestane  25 mg Oral Daily  . insulin aspart  0-9 Units Subcutaneous TID AC & HS  . metroNIDAZOLE  500 mg Oral 3 times per day  . pantoprazole  40 mg Oral Daily  . rOPINIRole  1 mg Oral QHS  . simvastatin  40 mg Oral QHS    Objective: Vital signs in last 24 hours: Temp:  [97.8 F (36.6 C)-98.1 F (36.7 C)] 97.8 F (36.6 C) (08/15 0814) Pulse Rate:  [78-88] 78 (08/15 0814) Resp:  [16-18] 18 (08/15 0814) BP: (121-128)/(50-62) 121/51 mmHg (08/15 0814) SpO2:  [97 %-100 %] 97 % (08/15 1110) Weight:  [91.672 kg  (202 lb 1.6 oz)-93.078 kg (205 lb 3.2 oz)] 93.078 kg (205 lb 3.2 oz) (08/15 0540) Constitutional: Awake, interactive, chronically ill apperaing HENT: [place]/AT, PERRLA, no scleral icterus Mouth/Throat: Oropharynx is clear and dry. No oropharyngeal exudate.  Cardiovascular: Normal rate, regular rhythm and normal heart sounds.  Pulmonary/Chest: Effort normal and breath sounds normal. No respiratory distress. has no wheezes.  Neck supple, no nuchal rigidity Abdominal: Soft. Bowel sounds are normal. exhibits some distension. There is no tenderness.  Lymphadenopathy: no cervical adenopathy. No axillary adenopathy Neurological: alert and oriented to person, place, and time.  Skin: Skin is warm and dry. No rash noted. No erythema.  Psychiatric: a normal mood and affect. behavior is normal.   Lab Results  Recent Labs  05/14/15 0403 05/15/15 0405 05/16/15 0613  HGB 10.0*  --   --   NA 138 138  --   K 3.4* 3.2*  --   CL 108 106  --   CO2 26 27  --   BUN 29* 28*  --   CREATININE 2.97* 2.90* 2.72*    Microbiology: Results for orders placed or performed during the hospital encounter of 05/10/15  Body fluid culture     Status: None   Collection Time: 05/10/15  6:30 PM  Result Value Ref Range Status   Specimen Description PERITONEAL  Final   Special Requests Normal  Final   Gram Stain MANY WBC SEEN NO ORGANISMS SEEN   Final   Culture NO GROWTH 4 DAYS  Final   Report Status 05/14/2015 FINAL  Final  Blood culture (routine x 2)     Status: None   Collection Time: 05/10/15  6:58 PM  Result Value Ref Range Status   Specimen Description BLOOD LEFT ANTECUBITAL  Final   Special Requests BOTTLES DRAWN AEROBIC AND ANAEROBIC 5ML  Final   Culture  Setup Time   Final    GRAM POSITIVE COCCI IN BOTH AEROBIC AND ANAEROBIC BOTTLES PREVIOUSLY CALLED TO DOLL FERGUSON AT 7741 05/11/15 BY TB CONFIRMED BY TB    Culture   Final    STAPHYLOCOCCUS EPIDERMIDIS IN BOTH AEROBIC AND ANAEROBIC  BOTTLES WARNING: For oxacillin-resistant S.aureus and coagulase-negative staphylococci (MRS), other beta-lactam agents, ie, penicillins, beta-lactam/beta-lactamase inhibitor combinations, cephems (with the exception of the cephalosporins with anti-MRSA  activity), and carbapenems, may appear active in vitro, but are not effective clinically.  --CLSI, Vol.32 No.3, January 2012, pg 70.    Report Status 05/13/2015 FINAL  Final   Organism ID, Bacteria STAPHYLOCOCCUS EPIDERMIDIS  Final      Susceptibility   Staphylococcus epidermidis - MIC*    CIPROFLOXACIN >=8 RESISTANT Resistant     ERYTHROMYCIN >=8 RESISTANT Resistant     GENTAMICIN <=0.5 SENSITIVE Sensitive     OXACILLIN Value in next row Resistant      >=4 RESISTANTWARNING: For oxacillin-resistant S.aureus and coagulase-negative staphylococci (MRS), other beta-lactam agents, ie, penicillins, beta-lactam/beta-lactamase inhibitor combinations, cephems (with the exception of the cephalosporins with anti-MRSA activity), and carbapenems, may appear active in vitro, but are not effective clinically.  --CLSI, Vol.32 No.3, January 2012, pg 70.    TETRACYCLINE Value in next row Sensitive      >=4 RESISTANTWARNING: For oxacillin-resistant S.aureus and coagulase-negative staphylococci (MRS), other beta-lactam agents, ie, penicillins, beta-lactam/beta-lactamase inhibitor combinations, cephems (with the exception of the cephalosporins with anti-MRSA activity), and carbapenems, may appear active in vitro, but are not effective clinically.  --CLSI, Vol.32 No.3, January 2012, pg 70.    VANCOMYCIN Value in next row Sensitive      >=4 RESISTANTWARNING: For oxacillin-resistant S.aureus and coagulase-negative staphylococci (MRS), other beta-lactam agents, ie, penicillins, beta-lactam/beta-lactamase inhibitor combinations, cephems (with the exception of the cephalosporins with anti-MRSA activity), and carbapenems, may appear active in vitro, but are not effective  clinically.  --CLSI, Vol.32 No.3, January 2012, pg 70.    CLINDAMYCIN Value in next row Sensitive      >=4 RESISTANTWARNING: For oxacillin-resistant S.aureus and coagulase-negative staphylococci (MRS), other beta-lactam agents, ie, penicillins, beta-lactam/beta-lactamase inhibitor combinations, cephems (with the exception of the cephalosporins with anti-MRSA activity), and carbapenems, may appear active in vitro, but are not effective clinically.  --CLSI, Vol.32 No.3, January 2012, pg 70.    LINEZOLID Value in next row Sensitive      SENSITIVE2    CEFAZOLIN Value in next row Resistant      SENSITIVE2    * STAPHYLOCOCCUS EPIDERMIDIS  Blood culture (routine x 2)     Status: None   Collection Time: 05/10/15  6:58 PM  Result Value Ref Range Status   Specimen Description BLOOD LEFT WRIST  Final   Special Requests BOTTLES DRAWN AEROBIC AND ANAEROBIC 2ML  Final   Culture  Setup Time   Final    GRAM POSITIVE COCCI ANAEROBIC BOTTLE ONLY CRITICAL RESULT CALLED TO,  READ BACK BY AND VERIFIED WITH: DOLL FERGUSON ON 05/11/15 AT 459 PM BY TB. CONFIRMED BY TB/SDR    Culture   Final    STAPHYLOCOCCUS EPIDERMIDIS WARNING: For oxacillin-resistant S.aureus and coagulase-negative staphylococci (MRS), other beta-lactam agents, ie, penicillins, beta-lactam/beta-lactamase inhibitor combinations, cephems (with the exception of the cephalosporins with anti-MRSA  activity), and carbapenems, may appear active in vitro, but are not effective clinically.  --CLSI, Vol.32 No.3, January 2012, pg 70.    Report Status 05/14/2015 FINAL  Final   Organism ID, Bacteria STAPHYLOCOCCUS EPIDERMIDIS  Final      Susceptibility   Staphylococcus epidermidis - MIC*    CIPROFLOXACIN >=8 RESISTANT Resistant     ERYTHROMYCIN >=8 RESISTANT Resistant     GENTAMICIN >=16 RESISTANT Resistant     OXACILLIN Value in next row Resistant      >=4 RESISTANTWARNING: For oxacillin-resistant S.aureus and coagulase-negative staphylococci (MRS),  other beta-lactam agents, ie, penicillins, beta-lactam/beta-lactamase inhibitor combinations, cephems (with the exception of the cephalosporins with anti-MRSA activity), and carbapenems, may appear active in vitro, but are not effective clinically.  --CLSI, Vol.32 No.3, January 2012, pg 70.    TETRACYCLINE Value in next row Resistant      >=4 RESISTANTWARNING: For oxacillin-resistant S.aureus and coagulase-negative staphylococci (MRS), other beta-lactam agents, ie, penicillins, beta-lactam/beta-lactamase inhibitor combinations, cephems (with the exception of the cephalosporins with anti-MRSA activity), and carbapenems, may appear active in vitro, but are not effective clinically.  --CLSI, Vol.32 No.3, January 2012, pg 70.    VANCOMYCIN Value in next row Sensitive      >=4 RESISTANTWARNING: For oxacillin-resistant S.aureus and coagulase-negative staphylococci (MRS), other beta-lactam agents, ie, penicillins, beta-lactam/beta-lactamase inhibitor combinations, cephems (with the exception of the cephalosporins with anti-MRSA activity), and carbapenems, may appear active in vitro, but are not effective clinically.  --CLSI, Vol.32 No.3, January 2012, pg 70.    CLINDAMYCIN Value in next row Resistant      >=4 RESISTANTWARNING: For oxacillin-resistant S.aureus and coagulase-negative staphylococci (MRS), other beta-lactam agents, ie, penicillins, beta-lactam/beta-lactamase inhibitor combinations, cephems (with the exception of the cephalosporins with anti-MRSA activity), and carbapenems, may appear active in vitro, but are not effective clinically.  --CLSI, Vol.32 No.3, January 2012, pg 70.    LINEZOLID Value in next row Sensitive      SENSITIVE1    CEFAZOLIN Value in next row Resistant      SENSITIVE1    LEVOFLOXACIN Value in next row Resistant      RESISTANT>=8    * STAPHYLOCOCCUS EPIDERMIDIS  C difficile quick scan w PCR reflex     Status: Abnormal   Collection Time: 05/11/15  6:35 PM  Result Value Ref  Range Status   C Diff antigen POSITIVE (A) NEGATIVE Final   C Diff toxin NEGATIVE NEGATIVE Final   C Diff interpretation   Final    Negative for toxigenic C. difficile. Toxin gene and active toxin production not detected. May be a nontoxigenic strain of C. difficile bacteria present, lacking the ability to produce toxin.  Clostridium Difficile by PCR     Status: None   Collection Time: 05/11/15  6:35 PM  Result Value Ref Range Status   Toxigenic C Difficile by pcr NEGATIVE NEGATIVE Final  Culture, blood (routine x 2)     Status: None (Preliminary result)   Collection Time: 05/12/15  9:35 AM  Result Value Ref Range Status   Specimen Description BLOOD LEFT WRIST  Final   Special Requests BOTTLES DRAWN AEROBIC AND ANAEROBIC  3 CC  Final  Culture NO GROWTH 4 DAYS  Final   Report Status PENDING  Incomplete  Culture, blood (routine x 2)     Status: None (Preliminary result)   Collection Time: 05/12/15  9:35 AM  Result Value Ref Range Status   Specimen Description BLOOD LEFT ASSIST CONTROL  Final   Special Requests BOTTLES DRAWN AEROBIC AND ANAEROBIC  5 CC  Final   Culture NO GROWTH 4 DAYS  Final   Report Status PENDING  Incomplete    Assessment/Plan: Becky Roberts is a 81 y.o. female with cirrhosis admitted with SBP SBP -culture negative - clinically improving Coag negative staph bacteremia- echo neg, no lines or hardware in place - likely contaminant. FU BCx negative Diarrhea - C diff neg ARF - GFR 18   Recommendations She has completed 5 days of ceftriaxone for the SBP - monitor of recurrence - If abd pain or other sxs recur would repeat paracentesis prior to restarting abx Continue flagyl even though C diff PCR was negative- would give 10  days total orally then stop.  I will sign off now but please call with questions.  Thank you very much for the consult. Will follow with you.  [place], [place]   05/16/2015, 2:12 PM

## 2015-05-16 NOTE — Clinical Social Work Note (Signed)
CSW spoke with patient this afternoon regarding PT's recommendation for rehab. Patient was able to repeat what I said to her and stated she understood what was being recommended. Patient tells this CSW that she is in agreement to go to rehab short term and will allow CSW to conduct a bedsearch. CSW to update DSS APS. Shela Leff MSW,LCSW [phone]

## 2015-05-16 NOTE — Plan of Care (Signed)
Problem: Phase III Progression Outcomes Goal: Voiding independently Outcome: Not Progressing Incontinent

## 2015-05-16 NOTE — Consult Note (Signed)
Consult History and Physical   SERVICE: Gynecology  Patient Name: Becky Roberts Patient MRN:   [id number]  CC: vaginal bleed  HPI: Becky Roberts is a 81 y.o. [id number] with acute (on chronic stage 3) kidney failure, diabetes, hypertension, asthma and liver cirrhosis with sequelae of portal hypertension and ascites, admitted this hospitalization with SBP sepsis on 05/10/15.  She has been treated for this and enteritis (C. Diff toxin neg, C. Diff antigen positive) with parental antibiotics and has responded well.  She is essentially bed-bound and requires assistance for incontinence of both stool and urine.  This morning she was being changed and the staff noted a sizable amount of dark blood which was determined to be coming from the vagina.  I was consulted for this purpose.    Becky Roberts has had 6 vaginal deliveries, her eldest daughter recently passed from brain cancer.  She herself has been treated for breast cancer, with a pill of come sort, but she is unable to give details.  She says she has never had post menopausal bleeding before, although tells the story that she went through menopause twice.  It sounds like she was menopausal in her 79's and then after this she began to have heavy or persistent VB and had two endometrial biopsies done at [facility](?) and was placed on some HRT which resolved her bleeding.  She never had any surgeries or hospital procedures. She has no idea when her last pap was or visit to a gynecologist.  Her family is now responsible for changing her diapers, and she wavers between them seeing blood within the last month, to not seeing blood ever.  No one is available to substantiate this information.    On admission a CT without contrast was performed (due to kidney failure) and was reported as 'Unenhanced uterus and ovaries unremarkable.'  I have reviewed these images as well.  Uterus is 6x5x9cm and shows thickening of the endometrium to 50m, with multiple  calcifications diffusely throughout. Bilateral ovaries are not clearly identified due to massive ascites and lack of enhancement due to no IV contrast.  Peritoneal fluid is absent of malignant cells.  Baseline creatinine on arrival on 8/9 was 1.35, and subsequently rose to 3.15 on 8/12.  Patient has been receiving albumin and improvement is slowly occurring. Today is 2.72.  She normally does not require oxygen, telemetry, and is not on anti-coagulation, however is on all three during this admission.  She states she feels much better since admission.   Review of Systems: positives in bold GEN:   fevers, chills, weight changes, appetite changes, fatigue, night sweats HEENT:  HA, vision changes, hearing loss, congestion, rhinorrhea, sinus pressure, dysphagia CV:   CP, palpitations PULM:  SOB, cough GI:  abd pain, N/V/D/C GU:  dysuria, urgency, frequency MSK:  arthralgias, myalgias, back pain, swelling SKIN:  rashes, color changes, pallor NEURO:  numbness, weakness, tingling, seizures, dizziness, tremors PSYCH:  depression, anxiety, behavioral problems, confusion  HEME/LYMPH:  easy bruising or bleeding ENDO:  heat/cold intolerance  Past Obstetrical History: OB History    Gravida Para Term Preterm AB TAB SAB Ectopic Multiple Living   _0 Obstetric Comments   1st Menstrual Cycle:  18 1st Pregnancy:  22      Past Gynecologic History: No LMP recorded. Patient is postmenopausal.   Past Medical History: Past Medical History  Diagnosis Date  . Blood transfusion reaction   .  Diabetes mellitus   . Depression   . Hypertension   . Anemia 2012    hgb 5.5, admitted,  workup multifactorial,  b12 deficiency  . Arthritis   . Asthma   . Portal hypertension February 2014    CT suggested a small nodular liver, multiple varices adjacent to the spleen and inferior mediastinum and paraesophageal region. No ascites.  . Breast cancer July 2,2013    T1c,N0,M0. ER + PR- , HER-2/neu  not overexpressing..  . Cirrhosis   . Cholelithiases   . CKD (chronic kidney disease) stage 3, GFR 30-59 ml/min     Past Surgical History:   Past Surgical History  Procedure Laterality Date  . Breast biopsy  July 2013    [name]: wide excision   . Colonoscopy  2012    Family History:  family history includes Arthritis in her father, mother, and other; Cancer in her other; Diabetes in her father and mother; Hypertension in her father and mother.  Social History:  Social History   Social History  . Marital Status: Married    Spouse Name: N/A  . Number of Children: N/A  . Years of Education: N/A   Occupational History  . Not on file.   Social History Main Topics  . Smoking status: Former Research scientist (life sciences)  . Smokeless tobacco: Never Used  . Alcohol Use: No  . Drug Use: No  . Sexual Activity: No   Other Topics Concern  . Not on file   Social History Narrative   Son Becky Roberts     Home Medications:  Medications reconciled in EPIC  No current facility-administered medications on file prior to encounter.   Current Outpatient Prescriptions on File Prior to Encounter  Medication Sig Dispense Refill  . albuterol (PROVENTIL HFA;VENTOLIN HFA) 108 (90 BASE) MCG/ACT inhaler Inhale 2 puffs into the lungs every 6 (six) hours as needed for wheezing. 1 Inhaler 11  . ALPRAZolam (XANAX) 0.25 MG tablet Take 1 tablet (0.25 mg total) by mouth at bedtime as needed for anxiety. 15 tablet 0  . Calcium Carbonate-Vitamin D (CALTRATE 600+D PO) Take 2 tablets by mouth daily.    . chlorhexidine (PERIDEX) 0.12 % solution Use as directed 15 mLs in the mouth or throat 2 (two) times daily. 120 mL 2  . ciprofloxacin (CIPRO) 250 MG tablet Take 1 tablet (250 mg total) by mouth 2 (two) times daily. 20 tablet 0  . cyanocobalamin (,VITAMIN B-12,) 1000 MCG/ML injection Inject 1 mL (1,000 mcg total) into the muscle every 30 (thirty) days. (Patient taking differently: Inject 1,000 mcg into the muscle every 30 (thirty)  days. Pt uses on the 28th of every month.) 10 mL 12  . diazepam (VALIUM) 2 MG tablet Take 2 mg by mouth 3 (three) times daily as needed for anxiety.    Marland Kitchen exemestane (AROMASIN) 25 MG tablet Take 25 mg by mouth daily.     . furosemide (LASIX) 20 MG tablet Take 1 tablet (20 mg total) by mouth daily. 30 tablet 0  . insulin aspart protamine- aspart (NOVOLOG MIX 70/30) (70-30) 100 UNIT/ML injection Inject 38-40 Units into the skin 2 (two) times daily with a meal. Pt uses 38 units with breakfast and 40 units with dinner.    . insulin detemir (LEVEMIR) 100 UNIT/ML injection 16 to 20 units daily (Patient taking differently: Inject 16-20 Units into the skin 3 (three) times daily as needed (for high blood sugar). Pt uses as needed per sliding scale.) 15 mL 12  . losartan (COZAAR)  100 MG tablet Take 1 tablet (100 mg total) by mouth daily. 90 tablet 3  . metoCLOPramide (REGLAN) 10 MG tablet Take 10 mg by mouth 3 (three) times daily before meals.     . metoprolol tartrate (LOPRESSOR) 25 MG tablet TAKE ONE TABLET TWICE DAILY (Patient taking differently: Take 25 mg by mouth 2 (two) times daily. ) 180 tablet 3  . nadolol (CORGARD) 20 MG tablet Take 1 tablet (20 mg total) by mouth daily. 30 tablet 0  . nystatin (MYCOSTATIN) 100000 UNIT/ML suspension Use as directed 5 mLs (500,000 Units total) in the mouth or throat 4 (four) times daily. 120 mL 0  . omeprazole (PRILOSEC) 20 MG capsule Take 20 mg by mouth daily.    Marland Kitchen oxyCODONE (OXY IR/ROXICODONE) 5 MG immediate release tablet Take 1 tablet (5 mg total) by mouth every 8 (eight) hours as needed for moderate pain. 30 tablet 0  . rOPINIRole (REQUIP) 0.5 MG tablet Take 1 mg by mouth at bedtime.    . simvastatin (ZOCOR) 40 MG tablet Take 40 mg by mouth at bedtime.    . Vitamin D, Ergocalciferol, (DRISDOL) 50000 UNITS CAPS capsule Take 50,000 Units by mouth every 7 (seven) days. Pt takes on Monday.      Allergies:  Allergies  Allergen Reactions  . Sulfa Antibiotics Hives   . Tylenol [Acetaminophen] Other (See Comments)    Has cirrhosis of the liver secondary to Tylenol use.  . Vioxx [Rofecoxib] Hives and Itching  . Aspirin Palpitations    Physical Exam:  Temp:  [97.8 F (36.6 C)-98.1 F (36.7 C)] 97.8 F (36.6 C) (08/15 0814) Pulse Rate:  [78-88] 78 (08/15 0814) Resp:  [16-18] 18 (08/15 0814) BP: (121-128)/(50-62) 121/51 mmHg (08/15 0814) SpO2:  [97 %-100 %] 97 % (08/15 1110) Weight:  [91.672 kg (202 lb 1.6 oz)-93.078 kg (205 lb 3.2 oz)] 93.078 kg (205 lb 3.2 oz) (08/15 0540)   General Appearance:  Well developed, well nourished, no acute distress, alert and oriented x3 HEENT:  Normocephalic atraumatic, extraocular movements intact, moist mucous membranes Cardiovascular:  Normal S1/S2, regular rate and rhythm, no murmurs Pulmonary:  clear to auscultation, no wheezes, rales or rhonchi, symmetric air entry, good air exchange Extremities:  no pedal edema, 2+ distal pulses, no tenderness Psychiatric:  Normal mood and affect, appropriate Pelvic:  Atrophic and keratotic external vulva.  Nystatin powder in creases.  Absent bilateral labia minora.  No noticeable prolapse.  Speculum exam unable to be performed due to patient position/bedding/lighting.  Bimanual exam shows a normal palpated cervix with muliparous os, and blood on exam glove.  Non-tender.    Labs/Studies:   CBC and Coags:  Lab Results  Component Value Date   WBC 8.1 05/13/2015   NEUTOPHILPCT 55.9 04/21/2014   EOSPCT 3.2 04/21/2014   BASOPCT 1.2 04/21/2014   LYMPHOPCT 30.2 04/21/2014   HGB 10.0* 05/14/2015   HCT 28.8* 05/13/2015   MCV 96.6 05/13/2015   PLT 82* 05/13/2015   INR 1.52 04/14/2015   CMP:  Lab Results  Component Value Date   NA 138 05/15/2015   K 3.2* 05/15/2015   CL 106 05/15/2015   CO2 27 05/15/2015   BUN 28* 05/15/2015   CREATININE 2.72* 05/16/2015   CREATININE 2.90* 05/15/2015   CREATININE 2.97* 05/14/2015   PROT 4.7* 05/14/2015   BILITOT 0.8 05/14/2015   ALT  16 05/14/2015   AST 42* 05/14/2015   ALKPHOS 63 05/14/2015    Other Imaging: Ct Abdomen Pelvis Wo Contrast  05/10/2015   CLINICAL DATA:  Nausea, vomiting, diarrhea  EXAM: CT ABDOMEN AND PELVIS WITHOUT CONTRAST  TECHNIQUE: Multidetector CT imaging of the abdomen and pelvis was performed following the standard protocol without IV contrast.  COMPARISON:  Seven/ 14/16  FINDINGS: Sagittal images of the spine shows again degenerative changes in lower thoracic spine. Degenerative changes lumbar spine L4-L5 level. Study is limited without IV contrast. Bilateral small pleural effusion with bilateral lower lobe atelectasis. Cirrhosis of the liver again noted. There is abundant perihepatic and perisplenic ascites. Abundant ascites noted bilateral paracolic gutters left greater than right. Multiple calcified gallstones are again noted within gallbladder. Atherosclerotic calcifications of abdominal aorta and iliac arteries.  No aortic aneurysm. Moderate distended proximal small bowel loops probable due to enteritis especially and in jejunum. There is no definite evidence of small bowel obstruction. Distal small bowel is smaller caliber probable empty partially collapsed.  Unenhanced uterus and ovaries unremarkable. The terminal ileum is unremarkable.  Paraesophageal varices again noted. Oral contrast material was given to the patient. No evidence of gastric outlet obstruction.  Unenhanced kidney shows no nephrolithiasis. No hydronephrosis or hydroureter. Unenhanced pancreas, spleen and adrenal glands are unremarkable.  IMPRESSION: 1. Again noted cirrhosis of the liver. Again noted abdominal and pelvic ascites. 2. There are moderate distended jejunal loops probable due to enteritis or ileus. No definite evidence of small bowel obstruction. 3. No hydronephrosis or hydroureter is. Again noted paraesophageal varices. 4. Bilateral small pleural effusion with bilateral lower lobe posterior atelectasis. 5. No evidence of gastric  outlet obstruction.   Electronically Signed   By: Lahoma Crocker M.D.   On: 05/10/2015 19:53     Assessment / Plan:   Jancie D Montalban is a 81 y.o. with vaginal bleeding, in addition to hospital admission for SBP/sepsis from cirrhosis, AKI on chronic AKF.  1. Vaginal bleeding:  Will come by tomorrow and attempt an endometrial biopsy for uterine tissue sample.  We must rule out malignancy.  Will also start her on hormonal treatment after biopsy is procured.  Megace 11m BID at least until follow up.  If she tolerates, she may be a candidate for a Mirena IUD, for hassle-free treatment.    2. SBP/Sepsis:  Vastly improved since admission per notes review and patient perspective.  She is anticipating discharge in the next day or so  3. AKI: continue supportive treatment per IM and Nephrology  4. Dispo:  Case Management on board for SNF placement, not sure when this will happen, but likely soon.  For her VB, she should follow up with me in my office as an outpatient to discuss biopsy results if I am able to obtain one tomorrow.  Follow up appointment is suggested in discharge planning.      Thank you for the opportunity to be involved with this patient's care.   ----- CLarey Days MD Attending Obstetrician and Gynecologist [facility]

## 2015-05-16 NOTE — Progress Notes (Signed)
Valium 2mg  po given per pt request for "nerves" will monitor.

## 2015-05-16 NOTE — Progress Notes (Signed)
No novolog administered on shift. Parameters not met, CBG 126

## 2015-05-16 NOTE — Care Management Note (Signed)
Case Management Note  Patient Details  Name: Becky Roberts MRN: [id number] Date of Birth: 1932-04-08  Subjective/Objective:  Current discharge plan is discharge to SNF. Case management will be available for discharge planning if needed.                  Action/Plan:   Expected Discharge Date:                  Expected Discharge Plan:     In-House Referral:     Discharge planning Services     Post Acute Care Choice:    Choice offered to:     DME Arranged:    DME Agency:     HH Arranged:    [place] Agency:     Status of Service:     Medicare Important Message Given:  Yes-second notification given Date Medicare IM Given:    Medicare IM give by:    Date Additional Medicare IM Given:    Additional Medicare Important Message give by:     If discussed at [place] of Stay Meetings, dates discussed:    Additional Comments:  Eyla Tallon A, RN 05/16/2015, 3:44 PM

## 2015-05-16 NOTE — Progress Notes (Signed)
[place] [facility] [place] NAME: Becky Roberts    MR#:  [id number]  DATE OF BIRTH:  1932-02-24  SUBJECTIVE:  CHIEF COMPLAINT:   Chief Complaint  Patient presents with  . Nausea  . Emesis  . Diarrhea   Patient is 81 year old Caucasian female with history of liver cirrhosis, ascites, who presents to the hospital with complaints of nausea, vomiting and diarrhea as well as abdominal pain. She underwent peritoneal tap yesterday on admission which revealed the more than 12,000 white blood cells, 88% of them were neutrophils signifying spontaneous bacterial peritonitis, patient was initiated on broad-spectrum antibiotic therapy with Rocephin and vancomycin. Patient admits of nausea, however, denies any more vomiting, admits of diarrheal stool, admits of abdominal pain radiating to from one side of abdomen to other as well as in pain in the lower abdomen, more on the right side. CT of abdomen and pelvis revealed liver cirrhosis, ascites distention of jejunal loops suggested enteritis Blood culture taken on 05/10/2015 showed gram-positive cocci, stool for C. difficile revealed no C. difficile toxin, however, positive for C. difficile antigen. Patient had significant diarrhea with green stool liquidy yesterday and was hypotensive. Stool frequency has decreased and patient did not have bowel movements overnight. Blood pressure has improved , kidney function slightly improved. Now patient is on albumin IV per nephrology consultation. Patient feels poorly. Denies any pain. Feels weak . Complains of finger swelling, admits of orthopnea Today, 14th of 2015/06/14 patient feels weak, but overall somewhat better, the pressure has improved as well as kidney function. Creatinine is 2.7 today, still not at baseline, which is 1.69. Oral intake also has improved.   Review of Systems  Constitutional: Positive for chills and malaise/fatigue. Negative for fever and weight  loss.  HENT: Negative for congestion.   Eyes: Negative for blurred vision and double vision.  Respiratory: Negative for cough, sputum production, shortness of breath and wheezing.   Cardiovascular: Negative for chest pain, palpitations, orthopnea, leg swelling and PND.  Gastrointestinal: Positive for abdominal pain and diarrhea. Negative for nausea, vomiting, constipation and blood in stool.  Genitourinary: Negative for dysuria, urgency, frequency and hematuria.  Musculoskeletal: Negative for falls.  Neurological: Negative for dizziness, tremors, focal weakness and headaches.  Endo/Heme/Allergies: Does not bruise/bleed easily.  Psychiatric/Behavioral: Negative for depression. The patient does not have insomnia.     VITAL SIGNS: Blood pressure 121/51, pulse 78, temperature 97.8 F (36.6 C), temperature source Oral, resp. rate 18, height 5\' 2"  (1.575 m), weight 93.078 kg (205 lb 3.2 oz), SpO2 97 %.  PHYSICAL EXAMINATION:   GENERAL:  81 y.o.-year-old patient lying in the bed with no acute distress. Better hydrated. Diffuse anasarca. Laying down flat. There is no significant shortness of breath  EYES: Pupils equal, round, reactive to light and accommodation. No scleral icterus. Extraocular muscles intact.  HEENT: Head atraumatic, normocephalic. Oropharynx and nasopharynx clear.  NECK:  Supple, no jugular venous distention. No thyroid enlargement, no tenderness.  LUNGS: Diminished breath sounds bilaterally at bases, no wheezing, rales,rhonchi or crepitation. No use of accessory muscles of respiration. Diffuse anasarca. Abdomen is ascitic with fluid wave.  CARDIOVASCULAR: S1, S2 normal. No murmurs, rubs, or gallops.  ABDOMEN: Soft, no discomfort on palpation diffusely. Fluid wave was felt No discrete areas of disc of pain was noted. No rebound or guarding , nondistended. Bowel sounds present. No organomegaly or mass.  EXTREMITIES: Trace lower extremity and pedal edema, no cyanosis, or clubbing.   NEUROLOGIC: Cranial  nerves II through XII are intact. Muscle strength 5/5 in all extremities. Sensation intact. Gait not checked.  PSYCHIATRIC: The patient is alert and oriented x 3.  SKIN: No obvious rash, lesion, or ulcer. Diffuse anasarca  ORDERS/RESULTS REVIEWED:   CBC  Recent Labs Lab 05/10/15 1524 05/11/15 0426 05/13/15 0724 05/14/15 0403  WBC 4.7 9.4 8.1  --   HGB 13.4 10.9* 9.5* 10.0*  HCT 40.2 32.8* 28.8*  --   PLT 142* 98* 82*  --   MCV 96.2 96.2 96.6  --   MCH 32.1 32.0 31.7  --   MCHC 33.4 33.3 32.8  --   RDW 15.9* 16.0* 16.4*  --    ------------------------------------------------------------------------------------------------------------------  Chemistries   Recent Labs Lab 05/10/15 1524 05/11/15 0426 05/13/15 0724 05/13/15 1722 05/14/15 0403 05/15/15 0405 05/15/15 0923 05/16/15 0613  NA 141 142 139 137 138 138  --   --   K 3.3* 3.2* 3.6 3.9 3.4* 3.2*  --   --   CL 105 109 106 107 108 106  --   --   CO2 28 25 26 26 26 27   --   --   GLUCOSE 122* 102* 93 117* 106* 112*  --   --   BUN 9 13 28* 29* 29* 28*  --   --   CREATININE 1.35* 1.69* 3.15* 3.02* 2.97* 2.90*  --  2.72*  CALCIUM 8.1* 7.5* 7.4* 7.6* 7.3* 7.9*  --   --   MG  --  1.3* 1.4*  --   --   --  2.3  --   AST 46*  --   --   --  42*  --   --   --   ALT 17  --   --   --  16  --   --   --   ALKPHOS 80  --   --   --  63  --   --   --   BILITOT 2.0*  --   --   --  0.8  --   --   --    ------------------------------------------------------------------------------------------------------------------ estimated creatinine clearance is 17.2 mL/min (by C-G formula based on Cr of 2.72). ------------------------------------------------------------------------------------------------------------------ No results for input(s): TSH, T4TOTAL, T3FREE, THYROIDAB in the last 72 hours.  Invalid input(s): FREET3  Cardiac Enzymes  Recent Labs Lab 05/10/15 [place] <0.03    ------------------------------------------------------------------------------------------------------------------ Invalid input(s): POCBNP ---------------------------------------------------------------------------------------------------------------  RADIOLOGY: No results found.  EKG:  Orders placed or performed during the hospital encounter of 05/10/15  . EKG 12-Lead  . 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 Spontaneous bacterial peritonitis, completed Rocephin 6 day course as recommended by Dr. Ola Spurr ,  clinically improved 2. Bacteremia due to Staphylococcus aureus epidermidis, likely contamination ,  repeated blood cultures taken 11th of August 2016 are negative so far. Of vancomycin. Echocardiogram was unremarkable with no valvular involvement , appreciate infectious diseases input. Patient is stable clinically 3. Acute on Chronic renal failure, likely ATN due to hypotension with diarrhea, UA was unremarkable, not suggestive of urinary tract infection, received few days of albumin infusion, kidney function is slightly better than yesterday.  blood  pressure has improved. BMP tomorrow morning. Appreciate nephrology input. Discussed with Dr. Juleen China. Palliative care consultation on Monday 4. Hypokalemia, supplemented orally. low magnesium level, supplemented IV 5. Ascites. Holding diuretics, Lasix as well as spironolactone due to diarrhea, renal failure, resume when kidney function is back to normal 6.  C. difficile enterocolitis, continue soft diet  , continue Flagyl orally, will need to continue 14 day therapy after last dose of Rocephin, which is through 05/29/2015,  diarrhea has subsided.  7. Generalized weakness. Initiate physical therapy, may need to go to a rehabilitation facility due to profound  weakness  Management plans discussed with the patient, Dr. Juleen China and they are in agreement.   DRUG ALLERGIES:  Allergies  Allergen Reactions  . Sulfa Antibiotics Hives  . Tylenol [Acetaminophen] Other (See Comments)    Has cirrhosis of the liver secondary to Tylenol use.  . Vioxx [Rofecoxib] Hives and Itching  . Aspirin Palpitations    CODE STATUS:     Code Status Orders        Start     Ordered   05/10/15 2254  Do not attempt resuscitation (DNR)   Continuous    Question Answer Comment  In the event of cardiac or respiratory ARREST Do not call a "code blue"   In the event of cardiac or respiratory ARREST Do not perform Intubation, CPR, defibrillation or ACLS   In the event of cardiac or respiratory ARREST Use medication by any route, position, wound care, and other measures to relive pain and suffering. May use oxygen, suction and manual treatment of airway obstruction as needed for comfort.      05/10/15 2253      TOTAL TIME TAKING CARE OF THIS PATIENT: 40 minutes.    Theodoro Grist M.D on 05/16/2015 at 2:27 PM  Between 7am to 6pm - Pager - [phone]  After 6pm go to [email or web address] - password [email or web address] [facility]  [facility]  Office  [phone]  CC: Primary care physician; No primary care provider on file.

## 2015-05-16 NOTE — Progress Notes (Signed)
Physical Therapy Treatment Patient Details Name: MARIBELL DEMEO MRN: [id number] DOB: 06-30-1932 Today's Date: 05/16/2015    History of Present Illness Pt was admitted to hospital with vommiting, signs of tachypnea, and was febrile.  Pt was diagnosed with sepsis secondary to spontaneous bacterial perionitis/gall stones/gallbladder disease.     PT Comments    Improved tolerance to upright this date; no subjective/objective signs of orthostasis noted.  Vitals stable and WFL.  Able to initiate short-distance gait training with RW (10', min assist), but unable to tolerate additional distance secondary to fatigue. Patient requesting return to bed at end of therapy session, but encouraged for up in chair for at least 30 minutes; CNA informed/aware of patient goal.   Follow Up Recommendations  SNF     Equipment Recommendations  Rolling walker with 5" wheels    Recommendations for Other Services       Precautions / Restrictions Precautions Precautions: Fall Precaution Comments: No BP R UE, enteric isolation Restrictions Weight Bearing Restrictions: No    Mobility  Bed Mobility Overal bed mobility: Needs Assistance Bed Mobility: Supine to Sit     Supine to sit: Mod assist     General bed mobility comments: assist for truncal elevation this date  Transfers Overall transfer level: Needs assistance Equipment used: Rolling walker (2 wheeled) Transfers: Sit to/from Stand Sit to Stand: Min assist         General transfer comment: good hand placement. LEs slightly tremulous in stance, but no overt buckling.  Improved BP response to upright positioning this date.  Ambulation/Gait Ambulation/Gait assistance: Min assist Ambulation Distance (Feet): 10 Feet Assistive device: Rolling walker (2 wheeled)       General Gait Details: broad BOS with short, choppy steps; foward flexed trunk with mod WBing bilat UEs.  LEs tremulous, but no buckling.  Unable to tolerate additional  distance secondary to fatigue.   Stairs            Wheelchair Mobility    Modified Rankin (Stroke Patients Only)       Balance Overall balance assessment: Needs assistance Sitting-balance support: No upper extremity supported;Feet supported Sitting balance-Leahy Scale: Fair       Standing balance-Leahy Scale: Fair Standing balance comment: requires UE support on RW for all standing activities; limited ability to respond to external perturbation                    Cognition                            Exercises Other Exercises Other Exercises: Bilat LE seated therex, 1x10, AROM for muscular strength/endurance with functional activities    General Comments        Pertinent Vitals/Pain Pain Assessment: No/denies pain    Home Living                      Prior Function            PT Goals (current goals can now be found in the care plan section) Acute Rehab PT Goals Patient Stated Goal: to leave the hospital and return home PT Goal Formulation: With patient Time For Goal Achievement: 05/27/15 Potential to Achieve Goals: Fair Progress towards PT goals: Progressing toward goals    Frequency  Min 2X/week    PT Plan Current plan remains appropriate    Co-evaluation  End of Session Equipment Utilized During Treatment: Gait belt;Oxygen Activity Tolerance: Patient tolerated treatment well Patient left: in chair;with call bell/phone within reach;with chair alarm set     Time: 0929-5747 PT Time Calculation (min) (ACUTE ONLY): 27 min  Charges:  $Gait Training: 8-22 mins $Therapeutic Exercise: 8-22 mins                    G Codes:      Caedyn Raygoza H. Owens Shark, PT, DPT, NCS 05/16/2015, 1:27 PM [phone]

## 2015-05-17 DIAGNOSIS — G8220 Paraplegia, unspecified: Secondary | ICD-10-CM

## 2015-05-17 DIAGNOSIS — R110 Nausea: Secondary | ICD-10-CM

## 2015-05-17 DIAGNOSIS — I8510 Secondary esophageal varices without bleeding: Secondary | ICD-10-CM

## 2015-05-17 DIAGNOSIS — N939 Abnormal uterine and vaginal bleeding, unspecified: Secondary | ICD-10-CM

## 2015-05-17 DIAGNOSIS — Z515 Encounter for palliative care: Secondary | ICD-10-CM

## 2015-05-17 DIAGNOSIS — J90 Pleural effusion, not elsewhere classified: Secondary | ICD-10-CM

## 2015-05-17 DIAGNOSIS — R188 Other ascites: Secondary | ICD-10-CM

## 2015-05-17 DIAGNOSIS — I129 Hypertensive chronic kidney disease with stage 1 through stage 4 chronic kidney disease, or unspecified chronic kidney disease: Secondary | ICD-10-CM

## 2015-05-17 DIAGNOSIS — K7460 Unspecified cirrhosis of liver: Secondary | ICD-10-CM

## 2015-05-17 DIAGNOSIS — K658 Other peritonitis: Secondary | ICD-10-CM

## 2015-05-17 DIAGNOSIS — R197 Diarrhea, unspecified: Secondary | ICD-10-CM

## 2015-05-17 DIAGNOSIS — B957 Other staphylococcus as the cause of diseases classified elsewhere: Secondary | ICD-10-CM

## 2015-05-17 LAB — BASIC METABOLIC PANEL
Anion gap: 4 — ABNORMAL LOW (ref 5–15)
BUN: 22 mg/dL — AB (ref 6–20)
CHLORIDE: 112 mmol/L — AB (ref 101–111)
CO2: 27 mmol/L (ref 22–32)
CREATININE: 2.48 mg/dL — AB (ref 0.44–1.00)
Calcium: 8.3 mg/dL — ABNORMAL LOW (ref 8.9–10.3)
GFR calc Af Amer: 20 mL/min — ABNORMAL LOW (ref >60)
GFR calc non Af Amer: 17 mL/min — ABNORMAL LOW (ref >60)
Glucose, Bld: 135 mg/dL — ABNORMAL HIGH (ref 65–99)
Potassium: 3.8 mmol/L (ref 3.5–5.1)
Sodium: 143 mmol/L (ref 135–145)

## 2015-05-17 LAB — CULTURE, BLOOD (ROUTINE X 2)
Culture: NO GROWTH
Culture: NO GROWTH

## 2015-05-17 LAB — GLUCOSE, CAPILLARY
GLUCOSE-CAPILLARY: 124 mg/dL — AB (ref 65–99)
GLUCOSE-CAPILLARY: 140 mg/dL — AB (ref 65–99)
Glucose-Capillary: 107 mg/dL — ABNORMAL HIGH (ref 65–99)
Glucose-Capillary: 160 mg/dL — ABNORMAL HIGH (ref 65–99)

## 2015-05-17 MED ORDER — MEGESTROL ACETATE 40 MG PO TABS
80.0000 mg | ORAL_TABLET | Freq: Two times a day (BID) | ORAL | Status: DC
  Administered 2015-05-17 – 2015-05-19 (×5): 80 mg via ORAL
  Filled 2015-05-17 (×5): qty 2

## 2015-05-17 NOTE — Progress Notes (Signed)
[facility] NAME: Becky Roberts    MR#:  [id number]  DATE OF BIRTH:  13-Feb-1932  SUBJECTIVE:  CHIEF COMPLAINT:   Chief Complaint  Patient presents with  . Nausea  . Emesis  . Diarrhea   Patient is 81 year old Caucasian female with history of liver cirrhosis, ascites, who presents to the hospital with complaints of nausea, vomiting and diarrhea as well as abdominal pain. She underwent peritoneal tap yesterday on admission which revealed the more than 12,000 white blood cells, 88% of them were neutrophils signifying spontaneous bacterial peritonitis, patient was initiated on broad-spectrum antibiotic therapy with Rocephin and vancomycin. Patient admits of nausea, however, denies any more vomiting, admits of diarrheal stool, admits of abdominal pain radiating to from one side of abdomen to other as well as in pain in the lower abdomen, more on the right side. CT of abdomen and pelvis revealed liver cirrhosis, ascites distention of jejunal loops suggested enteritis Blood culture taken on 05/10/2015 showed gram-positive cocci, stool for C. difficile revealed no C. difficile toxin, however, positive for C. difficile antigen. Patient had significant diarrhea with green stool liquidy yesterday and was hypotensive. Stool frequency has decreased and patient did not have bowel movements overnight. Blood pressure has improved , kidney function slightly improved. Now patient is on albumin IV per nephrology consultation. Patient feels poorly. Denies any pain. Feels weak . Complains of finger swelling, admits of orthopnea Today, he seems cefoxitin thousand 16 patient feels satisfactory. He has minimal discomfort in the abdomen was noted to have vaginal bleeding yesterday. Patient was seen by gynecologist, Dr. Leonides Schanz, who attempted to get endometrial biopsy, which is submitted for cytology. Megace at 80 mg twice daily was initiated orally to stop  bleeding. Patient was advised to have endometrial biopsy in 2 weeks as outpatient if current attempt is nondiagnostic. Some diarrheal stool 1 yesterday   Review of Systems  Constitutional: Positive for chills and malaise/fatigue. Negative for fever and weight loss.  HENT: Negative for congestion.   Eyes: Negative for blurred vision and double vision.  Respiratory: Negative for cough, sputum production, shortness of breath and wheezing.   Cardiovascular: Negative for chest pain, palpitations, orthopnea, leg swelling and PND.  Gastrointestinal: Positive for abdominal pain and diarrhea. Negative for nausea, vomiting, constipation and blood in stool.  Genitourinary: Negative for dysuria, urgency, frequency and hematuria.  Musculoskeletal: Negative for falls.  Neurological: Negative for dizziness, tremors, focal weakness and headaches.  Endo/Heme/Allergies: Does not bruise/bleed easily.  Psychiatric/Behavioral: Negative for depression. The patient does not have insomnia.     VITAL SIGNS: Blood pressure 130/68, pulse 90, temperature 98 F (36.7 C), temperature source Oral, resp. rate 18, height 5\' 2"  (1.575 m), weight 92.761 kg (204 lb 8 oz), SpO2 94 %.  PHYSICAL EXAMINATION:   GENERAL:  81 y.o.-year-old patient lying in the bed with no acute distress. Better hydrated. Diffuse anasarca. Laying down flat. There is no significant shortness of breath  EYES: Pupils equal, round, reactive to light and accommodation. No scleral icterus. Extraocular muscles intact.  HEENT: Head atraumatic, normocephalic. Oropharynx and nasopharynx clear.  NECK:  Supple, no jugular venous distention. No thyroid enlargement, no tenderness.  LUNGS: Diminished breath sounds bilaterally at bases, no wheezing, rales,rhonchi or crepitation. No use of accessory muscles of respiration. Diffuse anasarca. Abdomen is ascitic with fluid wave.  CARDIOVASCULAR: S1, S2 normal. No murmurs, rubs, or gallops.  ABDOMEN: Soft, no  discomfort on palpation diffusely. Fluid wave was  felt No discrete areas of disc of pain was noted. No rebound or guarding , nondistended. Bowel sounds present. No organomegaly or mass.  EXTREMITIES: Trace lower extremity and pedal edema, no cyanosis, or clubbing.  NEUROLOGIC: Cranial nerves II through XII are intact. Muscle strength 5/5 in all extremities. Sensation intact. Gait not checked.  PSYCHIATRIC: The patient is alert and oriented x 3.  SKIN: No obvious rash, lesion, or ulcer. Diffuse anasarca GYN:. Vaginal bleed with some clotted blood at the entrance ORDERS/RESULTS REVIEWED:   CBC  Recent Labs Lab 05/10/15 1524 05/11/15 0426 05/13/15 0724 05/14/15 0403  WBC 4.7 9.4 8.1  --   HGB 13.4 10.9* 9.5* 10.0*  HCT 40.2 32.8* 28.8*  --   PLT 142* 98* 82*  --   MCV 96.2 96.2 96.6  --   MCH 32.1 32.0 31.7  --   MCHC 33.4 33.3 32.8  --   RDW 15.9* 16.0* 16.4*  --    ------------------------------------------------------------------------------------------------------------------  Chemistries   Recent Labs Lab 05/10/15 1524 05/11/15 0426 05/13/15 0724 05/13/15 1722 05/14/15 0403 05/15/15 0405 05/15/15 0923 05/16/15 0613 05/17/15 0404  NA 141 142 139 137 138 138  --   --  143  K 3.3* 3.2* 3.6 3.9 3.4* 3.2*  --   --  3.8  CL 105 109 106 107 108 106  --   --  112*  CO2 28 25 26 26 26 27   --   --  27  GLUCOSE 122* 102* 93 117* 106* 112*  --   --  135*  BUN 9 13 28* 29* 29* 28*  --   --  22*  CREATININE 1.35* 1.69* 3.15* 3.02* 2.97* 2.90*  --  2.72* 2.48*  CALCIUM 8.1* 7.5* 7.4* 7.6* 7.3* 7.9*  --   --  8.3*  MG  --  1.3* 1.4*  --   --   --  2.3  --   --   AST 46*  --   --   --  42*  --   --   --   --   ALT 17  --   --   --  16  --   --   --   --   ALKPHOS 80  --   --   --  63  --   --   --   --   BILITOT 2.0*  --   --   --  0.8  --   --   --   --     ------------------------------------------------------------------------------------------------------------------ estimated creatinine clearance is 18.9 mL/min (by C-G formula based on Cr of 2.48). ------------------------------------------------------------------------------------------------------------------ No results for input(s): TSH, T4TOTAL, T3FREE, THYROIDAB in the last 72 hours.  Invalid input(s): FREET3  Cardiac Enzymes  Recent Labs Lab 05/10/15 August <0.03   ------------------------------------------------------------------------------------------------------------------ Invalid input(s): POCBNP ---------------------------------------------------------------------------------------------------------------  RADIOLOGY: No results found.  EKG:  Orders placed or performed during the hospital encounter of 05/10/15  . EKG 12-Lead  . EKG 12-Lead    ASSESSMENT AND PLAN:  Principal Problem:   Sepsis Active Problems:   Type II diabetes mellitus   Hypertension   COPD (chronic obstructive pulmonary disease)   Breast cancer   Chronic diastolic congestive heart failure   Restless legs syndrome   Cirrhosis of liver not due to alcohol   SBP (spontaneous bacterial peritonitis)   Malnutrition of moderate degree  1. Spontaneous bacterial peritonitis, completed Rocephin 6 day course as recommended by Dr. Ola Spurr ,  clinically improved  2. Bacteremia due to Staphylococcus aureus epidermidis, likely contamination ,  repeated blood cultures taken 11th of August 2016 are negative. Of vancomycin. Echocardiogram was unremarkable with no valvular involvement , appreciate infectious diseases input. Patient is stable clinically 3. Acute on Chronic renal failure, likely ATN due to hypotension with diarrhea, UA was unremarkable, not suggestive of urinary tract infection, received few days of albumin infusion, kidney function is better than before.  blood pressure has  improved. BMP tomorrow morning. Appreciate nephrology input.  4. Hypokalemia, supplemented orally. low magnesium level, supplemented IV 5. Ascites. Holding diuretics, Lasix as well as spironolactone due to diarrhea, renal failure, resume when kidney function is back to normal 6.  C. difficile enterocolitis, continue soft diet  , continue Flagyl orally, will need to continue 14 day therapy after last dose of Rocephin, which is through 05/29/2015, diarrhea has subsided.  7. Generalized weakness. Initiated physical therapy, may need to go to a rehabilitation facility due to profound  weakness 8. Vaginal bleeding status post endometrial biopsy at the bedside by Dr. Leonides Schanz recommended Megace at 80 mg twice daily dose, awaiting for cytology. Patient may need to have repeated the endometrial biopsy in 2 weeks as outpatient if sample is nondiagnostic. CT scan of abdomen is concerning for thickened endometrium per GYN specialist.    Management plans discussed with the patient, Dr. Leonides Schanz and they are in agreement.   DRUG ALLERGIES:  Allergies  Allergen Reactions  . Sulfa Antibiotics Hives  . Tylenol [Acetaminophen] Other (See Comments)    Has cirrhosis of the liver secondary to Tylenol use.  . Vioxx [Rofecoxib] Hives and Itching  . Aspirin Palpitations    CODE STATUS:     Code Status Orders        Start     Ordered   05/10/15 2254  Do not attempt resuscitation (DNR)   Continuous    Question Answer Comment  In the event of cardiac or respiratory ARREST Do not call a "code blue"   In the event of cardiac or respiratory ARREST Do not perform Intubation, CPR, defibrillation or ACLS   In the event of cardiac or respiratory ARREST Use medication by any route, position, wound care, and other measures to relive pain and suffering. May use oxygen, suction and manual treatment of airway obstruction as needed for comfort.      05/10/15 2253      TOTAL TIME TAKING CARE OF THIS PATIENT: 40 minutes.   Treatment plan as well as follow-up was discussed with patient and OB/GYN specialist. Time spent approximately 10 more minutes for discussions  Latoya Maulding M.D on 05/17/2015 at 3:08 PM  Between 7am to 6pm - Pager - [phone]  After 6pm go to [email or web address] - password [email or web address] [facility]  [facility]  Office  [phone]  CC: Primary care physician; No primary care provider on file.

## 2015-05-17 NOTE — Clinical Social Work Note (Signed)
Patient has had two bed offers and she has accepted the bed at [facility]. [facility] notified. [employer] APS updated as well. Patient's daughter was present during [place] visit today and patient gave CSW permission to speak about STR with her. Shela Leff MSW,LCSW [phone]

## 2015-05-17 NOTE — Progress Notes (Signed)
.  PT Cancellation Note  Patient Details Name: Becky Roberts MRN: [id number] DOB: Sep 06, 1932   Cancelled Treatment:    Reason Eval/Treat Not Completed: Patient declined, no reason specified. Treatment attempted; pt refuses stating that she has had too much done today, referring to a bedside biopsy due to bleeding today. Will re attempt treatment tomorrow as the schedule allows.    [place] 05/17/2015, 3:30 PM

## 2015-05-17 NOTE — Progress Notes (Signed)
[facility]  ROUNDING NOTE   Subjective:  Pt seen at bedside. Cr trending down slowly.  Currently down to 2.48.  Objective:  Vital signs in last 24 hours:  Temp:  [97.7 F (36.5 C)-98 F (36.7 C)] 98 F (36.7 C) (08/16 1150) Pulse Rate:  [82-90] 90 (08/16 1150) Resp:  [18] 18 (08/16 1150) BP: (120-143)/(45-68) 130/68 mmHg (08/16 1150) SpO2:  [94 %-100 %] 94 % (08/16 1150) Weight:  [92.761 kg (204 lb 8 oz)] 92.761 kg (204 lb 8 oz) (08/16 0609)  Weight change: 1.089 kg (2 lb 6.4 oz) Filed Weights   05/16/15 0500 05/16/15 0540 05/17/15 0609  Weight: 91.672 kg (202 lb 1.6 oz) 93.078 kg (205 lb 3.2 oz) 92.761 kg (204 lb 8 oz)    Intake/Output: I/O last 3 completed shifts: In: 120 [P.O.:120] Out: 3 [Urine:3]   Intake/Output this shift:     Physical Exam: General: NAD  Head: Normocephalic, atraumatic. Moist oral mucosal membranes  Eyes: Anicteric  Neck: Supple, trachea midline  Lungs:  Clear to auscultation normal effort  Heart: Regular rate and rhythm  Abdomen:  +distended, +ascites, non tender  Extremities:  1+peripheral and dependent edema.  Neurologic: Nonfocal, moving all four extremities  Skin: No lesions       Basic Metabolic Panel:  Recent Labs Lab 05/11/15 0426 05/13/15 0724 05/13/15 1722 05/14/15 0403 05/15/15 0405 05/15/15 0923 05/16/15 0613 05/17/15 0404  NA 142 139 137 138 138  --   --  143  K 3.2* 3.6 3.9 3.4* 3.2*  --   --  3.8  CL 109 106 107 108 106  --   --  112*  CO2 _0 --   --  27  GLUCOSE 102* 93 117* 106* 112*  --   --  135*  BUN 13 28* 29* 29* 28*  --   --  22*  CREATININE 1.69* 3.15* 3.02* 2.97* 2.90*  --  2.72* 2.48*  CALCIUM 7.5* 7.4* 7.6* 7.3* 7.9*  --   --  8.3*  MG 1.3* 1.4*  --   --   --  2.3  --   --   PHOS  --   --   --  3.6  --   --   --   --     Liver Function Tests:  Recent Labs Lab 05/10/15 1524 05/14/15 0403  AST 46* 42*  ALT 17 16  ALKPHOS 80 63  BILITOT 2.0* 0.8  PROT 5.3* 4.7*   ALBUMIN 2.1* 1.9*    Recent Labs Lab 05/10/15 1554  LIPASE 34    Recent Labs Lab 05/15/15 0923  AMMONIA 32    CBC:  Recent Labs Lab 05/10/15 1524 05/11/15 0426 05/13/15 0724 05/14/15 0403  WBC 4.7 9.4 8.1  --   HGB 13.4 10.9* 9.5* 10.0*  HCT 40.2 32.8* 28.8*  --   MCV 96.2 96.2 96.6  --   PLT 142* 98* 82*  --     Cardiac Enzymes:  Recent Labs Lab 05/10/15 1554  TROPONINI <0.03    BNP: Invalid input(s): POCBNP  CBG:  Recent Labs Lab 05/16/15 1127 05/16/15 1616 05/16/15 2050 05/17/15 0752 05/17/15 1153  GLUCAP 132* 142* 134* 107* 124*    Microbiology: Results for orders placed or performed during the hospital encounter of 05/10/15  Body fluid culture     Status: None   Collection Time: 05/10/15  6:30 PM  Result Value Ref Range Status   Specimen Description  PERITONEAL  Final   Special Requests Normal  Final   Gram Stain MANY WBC SEEN NO ORGANISMS SEEN   Final   Culture NO GROWTH 4 DAYS  Final   Report Status 05/14/2015 FINAL  Final  Blood culture (routine x 2)     Status: None   Collection Time: 05/10/15  6:58 PM  Result Value Ref Range Status   Specimen Description BLOOD LEFT ANTECUBITAL  Final   Special Requests BOTTLES DRAWN AEROBIC AND ANAEROBIC 5ML  Final   Culture  Setup Time   Final    GRAM POSITIVE COCCI IN BOTH AEROBIC AND ANAEROBIC BOTTLES PREVIOUSLY CALLED TO DOLL FERGUSON AT 4496 05/11/15 BY TB CONFIRMED BY TB    Culture   Final    STAPHYLOCOCCUS EPIDERMIDIS IN BOTH AEROBIC AND ANAEROBIC BOTTLES WARNING: For oxacillin-resistant S.aureus and coagulase-negative staphylococci (MRS), other beta-lactam agents, ie, penicillins, beta-lactam/beta-lactamase inhibitor combinations, cephems (with the exception of the cephalosporins with anti-MRSA  activity), and carbapenems, may appear active in vitro, but are not effective clinically.  --CLSI, Vol.32 No.3, January 2012, pg 70.    Report Status 05/13/2015 FINAL  Final   Organism ID,  Bacteria STAPHYLOCOCCUS EPIDERMIDIS  Final      Susceptibility   Staphylococcus epidermidis - MIC*    CIPROFLOXACIN >=8 RESISTANT Resistant     ERYTHROMYCIN >=8 RESISTANT Resistant     GENTAMICIN <=0.5 SENSITIVE Sensitive     OXACILLIN Value in next row Resistant      >=4 RESISTANTWARNING: For oxacillin-resistant S.aureus and coagulase-negative staphylococci (MRS), other beta-lactam agents, ie, penicillins, beta-lactam/beta-lactamase inhibitor combinations, cephems (with the exception of the cephalosporins with anti-MRSA activity), and carbapenems, may appear active in vitro, but are not effective clinically.  --CLSI, Vol.32 No.3, January 2012, pg 70.    TETRACYCLINE Value in next row Sensitive      >=4 RESISTANTWARNING: For oxacillin-resistant S.aureus and coagulase-negative staphylococci (MRS), other beta-lactam agents, ie, penicillins, beta-lactam/beta-lactamase inhibitor combinations, cephems (with the exception of the cephalosporins with anti-MRSA activity), and carbapenems, may appear active in vitro, but are not effective clinically.  --CLSI, Vol.32 No.3, January 2012, pg 70.    VANCOMYCIN Value in next row Sensitive      >=4 RESISTANTWARNING: For oxacillin-resistant S.aureus and coagulase-negative staphylococci (MRS), other beta-lactam agents, ie, penicillins, beta-lactam/beta-lactamase inhibitor combinations, cephems (with the exception of the cephalosporins with anti-MRSA activity), and carbapenems, may appear active in vitro, but are not effective clinically.  --CLSI, Vol.32 No.3, January 2012, pg 70.    CLINDAMYCIN Value in next row Sensitive      >=4 RESISTANTWARNING: For oxacillin-resistant S.aureus and coagulase-negative staphylococci (MRS), other beta-lactam agents, ie, penicillins, beta-lactam/beta-lactamase inhibitor combinations, cephems (with the exception of the cephalosporins with anti-MRSA activity), and carbapenems, may appear active in vitro, but are not effective clinically.   --CLSI, Vol.32 No.3, January 2012, pg 70.    LINEZOLID Value in next row Sensitive      SENSITIVE2    CEFAZOLIN Value in next row Resistant      SENSITIVE2    * STAPHYLOCOCCUS EPIDERMIDIS  Blood culture (routine x 2)     Status: None   Collection Time: 05/10/15  6:58 PM  Result Value Ref Range Status   Specimen Description BLOOD LEFT WRIST  Final   Special Requests BOTTLES DRAWN AEROBIC AND ANAEROBIC 2ML  Final   Culture  Setup Time   Final    GRAM POSITIVE COCCI ANAEROBIC BOTTLE ONLY CRITICAL RESULT CALLED TO, READ BACK BY AND VERIFIED WITH: DOLL FERGUSON ON  05/11/15 AT 459 PM BY TB. CONFIRMED BY TB/SDR    Culture   Final    STAPHYLOCOCCUS EPIDERMIDIS WARNING: For oxacillin-resistant S.aureus and coagulase-negative staphylococci (MRS), other beta-lactam agents, ie, penicillins, beta-lactam/beta-lactamase inhibitor combinations, cephems (with the exception of the cephalosporins with anti-MRSA  activity), and carbapenems, may appear active in vitro, but are not effective clinically.  --CLSI, Vol.32 No.3, January 2012, pg 70.    Report Status 05/14/2015 FINAL  Final   Organism ID, Bacteria STAPHYLOCOCCUS EPIDERMIDIS  Final      Susceptibility   Staphylococcus epidermidis - MIC*    CIPROFLOXACIN >=8 RESISTANT Resistant     ERYTHROMYCIN >=8 RESISTANT Resistant     GENTAMICIN >=16 RESISTANT Resistant     OXACILLIN Value in next row Resistant      >=4 RESISTANTWARNING: For oxacillin-resistant S.aureus and coagulase-negative staphylococci (MRS), other beta-lactam agents, ie, penicillins, beta-lactam/beta-lactamase inhibitor combinations, cephems (with the exception of the cephalosporins with anti-MRSA activity), and carbapenems, may appear active in vitro, but are not effective clinically.  --CLSI, Vol.32 No.3, January 2012, pg 70.    TETRACYCLINE Value in next row Resistant      >=4 RESISTANTWARNING: For oxacillin-resistant S.aureus and coagulase-negative staphylococci (MRS), other  beta-lactam agents, ie, penicillins, beta-lactam/beta-lactamase inhibitor combinations, cephems (with the exception of the cephalosporins with anti-MRSA activity), and carbapenems, may appear active in vitro, but are not effective clinically.  --CLSI, Vol.32 No.3, January 2012, pg 70.    VANCOMYCIN Value in next row Sensitive      >=4 RESISTANTWARNING: For oxacillin-resistant S.aureus and coagulase-negative staphylococci (MRS), other beta-lactam agents, ie, penicillins, beta-lactam/beta-lactamase inhibitor combinations, cephems (with the exception of the cephalosporins with anti-MRSA activity), and carbapenems, may appear active in vitro, but are not effective clinically.  --CLSI, Vol.32 No.3, January 2012, pg 70.    CLINDAMYCIN Value in next row Resistant      >=4 RESISTANTWARNING: For oxacillin-resistant S.aureus and coagulase-negative staphylococci (MRS), other beta-lactam agents, ie, penicillins, beta-lactam/beta-lactamase inhibitor combinations, cephems (with the exception of the cephalosporins with anti-MRSA activity), and carbapenems, may appear active in vitro, but are not effective clinically.  --CLSI, Vol.32 No.3, January 2012, pg 70.    LINEZOLID Value in next row Sensitive      SENSITIVE1    CEFAZOLIN Value in next row Resistant      SENSITIVE1    LEVOFLOXACIN Value in next row Resistant      RESISTANT>=8    * STAPHYLOCOCCUS EPIDERMIDIS  C difficile quick scan w PCR reflex     Status: Abnormal   Collection Time: 05/11/15  6:35 PM  Result Value Ref Range Status   C Diff antigen POSITIVE (A) NEGATIVE Final   C Diff toxin NEGATIVE NEGATIVE Final   C Diff interpretation   Final    Negative for toxigenic C. difficile. Toxin gene and active toxin production not detected. May be a nontoxigenic strain of C. difficile bacteria present, lacking the ability to produce toxin.  Clostridium Difficile by PCR     Status: None   Collection Time: 05/11/15  6:35 PM  Result Value Ref Range Status    Toxigenic C Difficile by pcr NEGATIVE NEGATIVE Final  Culture, blood (routine x 2)     Status: None   Collection Time: 05/12/15  9:35 AM  Result Value Ref Range Status   Specimen Description BLOOD LEFT WRIST  Final   Special Requests BOTTLES DRAWN AEROBIC AND ANAEROBIC  3 CC  Final   Culture NO GROWTH 5 DAYS  Final   Report  Status 05/17/2015 FINAL  Final  Culture, blood (routine x 2)     Status: None   Collection Time: 05/12/15  9:35 AM  Result Value Ref Range Status   Specimen Description BLOOD LEFT ASSIST CONTROL  Final   Special Requests BOTTLES DRAWN AEROBIC AND ANAEROBIC  5 CC  Final   Culture NO GROWTH 5 DAYS  Final   Report Status 05/17/2015 FINAL  Final    Coagulation Studies: No results for input(s): LABPROT, INR in the last 72 hours.  Urinalysis: No results for input(s): COLORURINE, LABSPEC, PHURINE, GLUCOSEU, HGBUR, BILIRUBINUR, KETONESUR, PROTEINUR, UROBILINOGEN, NITRITE, LEUKOCYTESUR in the last 72 hours.  Invalid input(s): APPERANCEUR    Imaging: No results found.   Medications:     . albumin human  12.5 g Intravenous BID  . antiseptic oral rinse  7 mL Mouth Rinse BID  . exemestane  25 mg Oral Daily  . feeding supplement (ENSURE ENLIVE)  237 mL Oral BID WC  . insulin aspart  0-9 Units Subcutaneous TID AC & HS  . megestrol  80 mg Oral BID  . metroNIDAZOLE  500 mg Oral 3 times per day  . pantoprazole  40 mg Oral Daily  . rOPINIRole  1 mg Oral QHS  . simvastatin  40 mg Oral QHS   albuterol, diazepam, liver oil-zinc oxide, morphine injection, ondansetron **OR** ondansetron (ZOFRAN) IV, oxyCODONE, sodium chloride  Assessment/ Plan:  Ms. DELLAMAE ROSAMILIA is a 81 y.o. white female with diabetes mellitus type 2, nonalcoholic liver cirrhosis, ascites, chronic kidney disease stage III, was admitted on 05/10/2015 with nausea, diarrhea. She has been diagnosed with SBP. Her blood culture is growing coag-negative staph which is resistant to multiple antibiotics . C.  difficile antigen is positive but C. difficile toxin is negative   1.Acute renal failure on chronic kidney disease stage III/IV. Acute renal failure most consistent with ATN. Baseline creatinine from admission of 1.69, with eGFR of 27.  - Cr continues to trend down slowly towards her baseline of 1.69, continue supportive care and avoid nephrotoxins as possible.   2. Spontaneous bacterial peritonitis K65.2. ID is following. Now off ceftriaxone.   3. Generalized edema: peripheral edema and ascites: off diuretics, will also stop albumin at this point in time.   LOS: 7 Yakov Bergen 8/16/20162:28 PM

## 2015-05-17 NOTE — Progress Notes (Signed)
Patient continue to have small  vaginal bleeding , GYN consult completed, oxycodone administer for pain per pt request will continue to monitor.
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
## 2015-05-17 NOTE — Procedures (Signed)
Procedure performed by: Donye Dauenhauer Location: patient's bed Specimen: endometrium and blood  Procedure in detail: The patient is bedbound and unable to move on her own accord.  Her legs were divided in the lithotomy position and a blind attempt to insert the endometrial pipelle was performed.  The pipelle was able to be advanced within the cervix but not much further into the internal os/uterus.  The vacuum was applied, however upon removal after first pass, the pipelle was broken in two pieces (folded in half). It was unable to be replaced.  As this is not a hospital supply, I was unable to obtain another pipelle to complete the procedure.  Patient tolerated well.  What was collected in the tubing appeared to be only blood, however it was placed in formalin and hand-delivered by me to pathology.  Perhaps there will be some cellular debris that can identify any malignancy.    ** The patient will return to see me in 2 weeks, as insrtucted.  If the sample is insufficient I will re-attempt in the office.  In the interim I will put the patient on Megace 80 BID.   ----- Larey Days, MD Attending Obstetrician and Gynecologist [facility]

## 2015-05-18 LAB — CREATININE, SERUM
Creatinine, Ser: 2.23 mg/dL — ABNORMAL HIGH (ref 0.44–1.00)
GFR, EST AFRICAN AMERICAN: 23 mL/min — AB (ref >60)
GFR, EST NON AFRICAN AMERICAN: 19 mL/min — AB (ref >60)

## 2015-05-18 LAB — HEMOGLOBIN: Hemoglobin: 10 g/dL — ABNORMAL LOW (ref 12.0–16.0)

## 2015-05-18 LAB — SURGICAL PATHOLOGY

## 2015-05-18 LAB — GLUCOSE, CAPILLARY
GLUCOSE-CAPILLARY: 139 mg/dL — AB (ref 65–99)
GLUCOSE-CAPILLARY: 106 mg/dL — AB (ref 65–99)
GLUCOSE-CAPILLARY: 127 mg/dL — AB (ref 65–99)
Glucose-Capillary: 111 mg/dL — ABNORMAL HIGH (ref 65–99)
Glucose-Capillary: 129 mg/dL — ABNORMAL HIGH (ref 65–99)

## 2015-05-18 NOTE — Progress Notes (Signed)
[facility]  ROUNDING NOTE   Subjective:  Cr down to 2.2. Continues to trend down slowly. Will need follow up in clinic for CKD.  To go to rehab ultimately.  Objective:  Vital signs in last 24 hours:  Temp:  [98.2 F (36.8 C)] 98.2 F (36.8 C) (08/16 2041) Pulse Rate:  [89] 89 (08/16 2041) Resp:  [18] 18 (08/16 2041) BP: (150)/(66) 150/66 mmHg (08/16 2041) SpO2:  [96 %] 96 % (08/16 2041) Weight:  [93.223 kg (205 lb 8.3 oz)] 93.223 kg (205 lb 8.3 oz) (08/17 1062)  Weight change: 0.462 kg (1 lb 0.3 oz) Filed Weights   05/16/15 0540 05/17/15 0609 05/18/15 0614  Weight: 93.078 kg (205 lb 3.2 oz) 92.761 kg (204 lb 8 oz) 93.223 kg (205 lb 8.3 oz)    Intake/Output:     Intake/Output this shift:     Physical Exam: General: NAD  Head: Normocephalic, atraumatic. Moist oral mucosal membranes  Eyes: Anicteric  Neck: Supple, trachea midline  Lungs:  Clear to auscultation normal effort  Heart: Regular rate and rhythm  Abdomen:  +distended, +ascites, non tender  Extremities:  1+peripheral and dependent edema.  Neurologic: Nonfocal, moving all four extremities  Skin: No lesions       Basic Metabolic Panel:  Recent Labs Lab 05/13/15 0724 05/13/15 1722 05/14/15 0403 05/15/15 0405 05/15/15 0923 05/16/15 0613 05/17/15 0404 05/18/15 0407  NA 139 137 138 138  --   --  143  --   K 3.6 3.9 3.4* 3.2*  --   --  3.8  --   CL 106 107 108 106  --   --  112*  --   CO2 26 26 26 27   --   --  27  --   GLUCOSE 93 117* 106* 112*  --   --  135*  --   BUN 28* 29* 29* 28*  --   --  22*  --   CREATININE 3.15* 3.02* 2.97* 2.90*  --  2.72* 2.48* 2.23*  CALCIUM 7.4* 7.6* 7.3* 7.9*  --   --  8.3*  --   MG 1.4*  --   --   --  2.3  --   --   --   PHOS  --   --  3.6  --   --   --   --   --     Liver Function Tests:  Recent Labs Lab 05/14/15 0403  AST 42*  ALT 16  ALKPHOS 63  BILITOT 0.8  PROT 4.7*  ALBUMIN 1.9*   No results for input(s): LIPASE, AMYLASE in the last  168 hours.  Recent Labs Lab 05/15/15 0923  AMMONIA 32    CBC:  Recent Labs Lab 05/13/15 0724 05/14/15 0403 05/18/15 0407  WBC 8.1  --   --   HGB 9.5* 10.0* 10.0*  HCT 28.8*  --   --   MCV 96.6  --   --   PLT 82*  --   --     Cardiac Enzymes: No results for input(s): CKTOTAL, CKMB, CKMBINDEX, TROPONINI in the last 168 hours.  BNP: Invalid input(s): POCBNP  CBG:  Recent Labs Lab 05/17/15 0752 05/17/15 1153 05/17/15 1628 05/17/15 2033 05/18/15 0722  GLUCAP 107* 124* 140* 160* 111*    Microbiology: Results for orders placed or performed during the hospital encounter of 05/10/15  Body fluid culture     Status: None   Collection Time: 05/10/15  6:30 PM  Result  Value Ref Range Status   Specimen Description PERITONEAL  Final   Special Requests Normal  Final   Gram Stain MANY WBC SEEN NO ORGANISMS SEEN   Final   Culture NO GROWTH 4 DAYS  Final   Report Status 05/14/2015 FINAL  Final  Blood culture (routine x 2)     Status: None   Collection Time: 05/10/15  6:58 PM  Result Value Ref Range Status   Specimen Description BLOOD LEFT ANTECUBITAL  Final   Special Requests BOTTLES DRAWN AEROBIC AND ANAEROBIC 5ML  Final   Culture  Setup Time   Final    GRAM POSITIVE COCCI IN BOTH AEROBIC AND ANAEROBIC BOTTLES PREVIOUSLY CALLED TO DOLL FERGUSON AT 2010 05/11/15 BY TB CONFIRMED BY TB    Culture   Final    STAPHYLOCOCCUS EPIDERMIDIS IN BOTH AEROBIC AND ANAEROBIC BOTTLES WARNING: For oxacillin-resistant S.aureus and coagulase-negative staphylococci (MRS), other beta-lactam agents, ie, penicillins, beta-lactam/beta-lactamase inhibitor combinations, cephems (with the exception of the cephalosporins with anti-MRSA  activity), and carbapenems, may appear active in vitro, but are not effective clinically.  --CLSI, Vol.32 No.3, January 2012, pg 70.    Report Status 05/13/2015 FINAL  Final   Organism ID, Bacteria STAPHYLOCOCCUS EPIDERMIDIS  Final      Susceptibility    Staphylococcus epidermidis - MIC*    CIPROFLOXACIN >=8 RESISTANT Resistant     ERYTHROMYCIN >=8 RESISTANT Resistant     GENTAMICIN <=0.5 SENSITIVE Sensitive     OXACILLIN Value in next row Resistant      >=4 RESISTANTWARNING: For oxacillin-resistant S.aureus and coagulase-negative staphylococci (MRS), other beta-lactam agents, ie, penicillins, beta-lactam/beta-lactamase inhibitor combinations, cephems (with the exception of the cephalosporins with anti-MRSA activity), and carbapenems, may appear active in vitro, but are not effective clinically.  --CLSI, Vol.32 No.3, January 2012, pg 70.    TETRACYCLINE Value in next row Sensitive      >=4 RESISTANTWARNING: For oxacillin-resistant S.aureus and coagulase-negative staphylococci (MRS), other beta-lactam agents, ie, penicillins, beta-lactam/beta-lactamase inhibitor combinations, cephems (with the exception of the cephalosporins with anti-MRSA activity), and carbapenems, may appear active in vitro, but are not effective clinically.  --CLSI, Vol.32 No.3, January 2012, pg 70.    VANCOMYCIN Value in next row Sensitive      >=4 RESISTANTWARNING: For oxacillin-resistant S.aureus and coagulase-negative staphylococci (MRS), other beta-lactam agents, ie, penicillins, beta-lactam/beta-lactamase inhibitor combinations, cephems (with the exception of the cephalosporins with anti-MRSA activity), and carbapenems, may appear active in vitro, but are not effective clinically.  --CLSI, Vol.32 No.3, January 2012, pg 70.    CLINDAMYCIN Value in next row Sensitive      >=4 RESISTANTWARNING: For oxacillin-resistant S.aureus and coagulase-negative staphylococci (MRS), other beta-lactam agents, ie, penicillins, beta-lactam/beta-lactamase inhibitor combinations, cephems (with the exception of the cephalosporins with anti-MRSA activity), and carbapenems, may appear active in vitro, but are not effective clinically.  --CLSI, Vol.32 No.3, January 2012, pg 70.    LINEZOLID Value in  next row Sensitive      SENSITIVE2    CEFAZOLIN Value in next row Resistant      SENSITIVE2    * STAPHYLOCOCCUS EPIDERMIDIS  Blood culture (routine x 2)     Status: None   Collection Time: 05/10/15  6:58 PM  Result Value Ref Range Status   Specimen Description BLOOD LEFT WRIST  Final   Special Requests BOTTLES DRAWN AEROBIC AND ANAEROBIC 2ML  Final   Culture  Setup Time   Final    GRAM POSITIVE COCCI ANAEROBIC BOTTLE ONLY CRITICAL RESULT CALLED TO, READ  BACK BY AND VERIFIED WITH: DOLL FERGUSON ON 05/11/15 AT 459 PM BY TB. CONFIRMED BY TB/SDR    Culture   Final    STAPHYLOCOCCUS EPIDERMIDIS WARNING: For oxacillin-resistant S.aureus and coagulase-negative staphylococci (MRS), other beta-lactam agents, ie, penicillins, beta-lactam/beta-lactamase inhibitor combinations, cephems (with the exception of the cephalosporins with anti-MRSA  activity), and carbapenems, may appear active in vitro, but are not effective clinically.  --CLSI, Vol.32 No.3, January 2012, pg 70.    Report Status 05/14/2015 FINAL  Final   Organism ID, Bacteria STAPHYLOCOCCUS EPIDERMIDIS  Final      Susceptibility   Staphylococcus epidermidis - MIC*    CIPROFLOXACIN >=8 RESISTANT Resistant     ERYTHROMYCIN >=8 RESISTANT Resistant     GENTAMICIN >=16 RESISTANT Resistant     OXACILLIN Value in next row Resistant      >=4 RESISTANTWARNING: For oxacillin-resistant S.aureus and coagulase-negative staphylococci (MRS), other beta-lactam agents, ie, penicillins, beta-lactam/beta-lactamase inhibitor combinations, cephems (with the exception of the cephalosporins with anti-MRSA activity), and carbapenems, may appear active in vitro, but are not effective clinically.  --CLSI, Vol.32 No.3, January 2012, pg 70.    TETRACYCLINE Value in next row Resistant      >=4 RESISTANTWARNING: For oxacillin-resistant S.aureus and coagulase-negative staphylococci (MRS), other beta-lactam agents, ie, penicillins, beta-lactam/beta-lactamase inhibitor  combinations, cephems (with the exception of the cephalosporins with anti-MRSA activity), and carbapenems, may appear active in vitro, but are not effective clinically.  --CLSI, Vol.32 No.3, January 2012, pg 70.    VANCOMYCIN Value in next row Sensitive      >=4 RESISTANTWARNING: For oxacillin-resistant S.aureus and coagulase-negative staphylococci (MRS), other beta-lactam agents, ie, penicillins, beta-lactam/beta-lactamase inhibitor combinations, cephems (with the exception of the cephalosporins with anti-MRSA activity), and carbapenems, may appear active in vitro, but are not effective clinically.  --CLSI, Vol.32 No.3, January 2012, pg 70.    CLINDAMYCIN Value in next row Resistant      >=4 RESISTANTWARNING: For oxacillin-resistant S.aureus and coagulase-negative staphylococci (MRS), other beta-lactam agents, ie, penicillins, beta-lactam/beta-lactamase inhibitor combinations, cephems (with the exception of the cephalosporins with anti-MRSA activity), and carbapenems, may appear active in vitro, but are not effective clinically.  --CLSI, Vol.32 No.3, January 2012, pg 70.    LINEZOLID Value in next row Sensitive      SENSITIVE1    CEFAZOLIN Value in next row Resistant      SENSITIVE1    LEVOFLOXACIN Value in next row Resistant      RESISTANT>=8    * STAPHYLOCOCCUS EPIDERMIDIS  C difficile quick scan w PCR reflex     Status: Abnormal   Collection Time: 05/11/15  6:35 PM  Result Value Ref Range Status   C Diff antigen POSITIVE (A) NEGATIVE Final   C Diff toxin NEGATIVE NEGATIVE Final   C Diff interpretation   Final    Negative for toxigenic C. difficile. Toxin gene and active toxin production not detected. May be a nontoxigenic strain of C. difficile bacteria present, lacking the ability to produce toxin.  Clostridium Difficile by PCR     Status: None   Collection Time: 05/11/15  6:35 PM  Result Value Ref Range Status   Toxigenic C Difficile by pcr NEGATIVE NEGATIVE Final  Culture, blood  (routine x 2)     Status: None   Collection Time: 05/12/15  9:35 AM  Result Value Ref Range Status   Specimen Description BLOOD LEFT WRIST  Final   Special Requests BOTTLES DRAWN AEROBIC AND ANAEROBIC  3 CC  Final   Culture NO  GROWTH 5 DAYS  Final   Report Status 05/17/2015 FINAL  Final  Culture, blood (routine x 2)     Status: None   Collection Time: 05/12/15  9:35 AM  Result Value Ref Range Status   Specimen Description BLOOD LEFT ASSIST CONTROL  Final   Special Requests BOTTLES DRAWN AEROBIC AND ANAEROBIC  5 CC  Final   Culture NO GROWTH 5 DAYS  Final   Report Status 05/17/2015 FINAL  Final    Coagulation Studies: No results for input(s): LABPROT, INR in the last 72 hours.  Urinalysis: No results for input(s): COLORURINE, LABSPEC, PHURINE, GLUCOSEU, HGBUR, BILIRUBINUR, KETONESUR, PROTEINUR, UROBILINOGEN, NITRITE, LEUKOCYTESUR in the last 72 hours.  Invalid input(s): APPERANCEUR    Imaging: No results found.   Medications:     . antiseptic oral rinse  7 mL Mouth Rinse BID  . exemestane  25 mg Oral Daily  . feeding supplement (ENSURE ENLIVE)  237 mL Oral BID WC  . insulin aspart  0-9 Units Subcutaneous TID AC & HS  . megestrol  80 mg Oral BID  . metroNIDAZOLE  500 mg Oral 3 times per day  . pantoprazole  40 mg Oral Daily  . rOPINIRole  1 mg Oral QHS  . simvastatin  40 mg Oral QHS   albuterol, diazepam, liver oil-zinc oxide, morphine injection, ondansetron **OR** ondansetron (ZOFRAN) IV, oxyCODONE, sodium chloride  Assessment/ Plan:  Ms. Becky Roberts is a 81 y.o. white female with diabetes mellitus type 2, nonalcoholic liver cirrhosis, ascites, chronic kidney disease stage III, was admitted on 05/10/2015 with nausea, diarrhea. She has been diagnosed with SBP. Her blood culture is growing coag-negative staph which is resistant to multiple antibiotics . C. difficile antigen is positive but C. difficile toxin is negative   1.Acute renal failure on chronic kidney  disease stage III/IV. Acute renal failure most consistent with ATN. Baseline creatinine from admission of 1.69, with eGFR of 27.  - Cr has been trending down quite slowly, will need continued monitoring of renal functino as outpt, will schedule follow up in our office with Dr. Candiss Norse.  2. Spontaneous bacterial peritonitis K65.2. ID is following. Treated with ceftriaxone.  3. Generalized edema: peripheral edema and ascites: remains off of diuretics as renal fu nction improving.    LOS: 8 Cordell Coke 8/17/201612:03 PM

## 2015-05-18 NOTE — Progress Notes (Signed)
[place] [facility] [place] NAME: Becky Roberts    MR#:  [id number]  DATE OF BIRTH:  [birth year]  SUBJECTIVE:  CHIEF COMPLAINT:   Chief Complaint  Patient presents with  . Nausea  . Emesis  . Diarrhea   No complaints presently. No diarrhea today. Abdominal pain, nausea, vomiting has resolved. Tolerating by mouth well.  Review of Systems  Constitutional: Positive for malaise/fatigue. Negative for fever, chills and weight loss.  HENT: Negative for congestion.   Eyes: Negative for blurred vision and double vision.  Respiratory: Negative for cough, sputum production, shortness of breath and wheezing.   Cardiovascular: Negative for chest pain, palpitations, orthopnea, leg swelling and PND.  Gastrointestinal: Negative for nausea, vomiting, abdominal pain, diarrhea, constipation and blood in stool.  Genitourinary: Negative for dysuria, urgency, frequency and hematuria.  Musculoskeletal: Negative for falls.  Neurological: Positive for weakness. Negative for dizziness, tremors, focal weakness and headaches.  Endo/Heme/Allergies: Does not bruise/bleed easily.  Psychiatric/Behavioral: Negative for depression. The patient does not have insomnia.     VITAL SIGNS: Blood pressure 128/65, pulse 86, temperature 98 F (36.7 C), temperature source Oral, resp. rate 18, height 5\' 2"  (1.575 m), weight 93.223 kg (205 lb 8.3 oz), SpO2 95 %.  PHYSICAL EXAMINATION:   GENERAL:  81 y.o.-year-old patient lying in the bed with no acute distress. Diffuse anasarca. EYES: Pupils equal, round, reactive to light and accommodation. No scleral icterus. Extraocular muscles intact.  HEENT: Head atraumatic, normocephalic. Oropharynx and nasopharynx clear.  NECK:  Supple, no jugular venous distention. No thyroid enlargement, no tenderness.  LUNGS: Diminished breath sounds bilaterally at bases, no wheezing, rales,rhonchi or crepitation. No use of accessory muscles of  respiration. Diffuse anasarca. Abdomen is ascites with fluid wave.  CARDIOVASCULAR: S1, S2 normal. No murmurs, rubs, or gallops.  ABDOMEN: Soft, NT, Distended. Fluid wave was felt No discrete areas of disc of pain was noted. No rebound or guarding , nondistended. Bowel sounds present. No organomegaly or mass.  EXTREMITIES: Trace lower extremity and pedal edema, no cyanosis, or clubbing.  NEUROLOGIC: Cranial nerves II through XII are intact. No focal motor or sensory deficits appreciated bilaterally. Globally weak.  PSYCHIATRIC: The patient is alert and oriented x 3.  SKIN: No obvious rash, lesion, or ulcer. Diffuse anasarca  CBC  Recent Labs Lab 05/13/15 0724 05/14/15 0403 05/18/15 0407  WBC 8.1  --   --   HGB 9.5* 10.0* 10.0*  HCT 28.8*  --   --   PLT 82*  --   --   MCV 96.6  --   --   MCH 31.7  --   --   MCHC 32.8  --   --   RDW 16.4*  --   --    ------------------------------------------------------------------------------------------------------------------  Chemistries   Recent Labs Lab 05/13/15 0724 05/13/15 1722 05/14/15 0403 05/15/15 0405 05/15/15 0923 05/16/15 0613 05/17/15 0404 05/18/15 0407  NA 139 137 138 138  --   --  143  --   K 3.6 3.9 3.4* 3.2*  --   --  3.8  --   CL 106 107 108 106  --   --  112*  --   CO2 26 26 26 27   --   --  27  --   GLUCOSE 93 117* 106* 112*  --   --  135*  --   BUN 28* 29* 29* 28*  --   --  22*  --   CREATININE  3.15* 3.02* 2.97* 2.90*  --  2.72* 2.48* 2.23*  CALCIUM 7.4* 7.6* 7.3* 7.9*  --   --  8.3*  --   MG 1.4*  --   --   --  2.3  --   --   --   AST  --   --  42*  --   --   --   --   --   ALT  --   --  16  --   --   --   --   --   ALKPHOS  --   --  63  --   --   --   --   --   BILITOT  --   --  0.8  --   --   --   --   --    ------------------------------------------------------------------------------------------------------------------ estimated creatinine clearance is 21 mL/min (by C-G formula based on Cr of  2.23). ------------------------------------------------------------------------------------------------------------------ No results for input(s): TSH, T4TOTAL, T3FREE, THYROIDAB in the last 72 hours.  Invalid input(s): FREET3  Cardiac Enzymes No results for input(s): CKMB, TROPONINI, MYOGLOBIN in the last 168 hours.  Invalid input(s): CK ------------------------------------------------------------------------------------------------------------------ Invalid input(s): POCBNP ---------------------------------------------------------------------------------------------------------------  RADIOLOGY: No results found.  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 Spontaneous bacterial peritonitis, completed Rocephin 6 day course as recommended by Dr. Ola Spurr ,  clinically improved.  No abdominal pain, fever, nausea, vomiting.  2. Bacteremia due to Staphylococcus aureus epidermidis, likely contamination ,  repeated blood cultures taken 11th of August 2016 are negative. Of vancomycin. Echocardiogram was unremarkable with no valvular involvement , appreciate infectious diseases input. Patient is stable clinically  3. Acute on Chronic renal failure, likely ATN due to hypotension with diarrhea, UA was unremarkable, not suggestive of urinary tract infection, received few days of albumin infusion, IV fluids and kidney function much improved and close to baseline. -No longer hypotensive. We'll repeat BMP tomorrow morning. Appreciate nephrology input.   4. Hypokalemia-improved and resolved with supplementation  5. Ascites -treated empirically for SBP. No evidence of tense  ascites presently. Diuretics are being held given her acute renal failure. Will resume Lasix upon discharge.  6.  C. difficile enterocolitis, continue soft diet  , continue Flagyl orally, will need to continue 14 day therapy after last dose of Rocephin, which is through 05/29/2015, diarrhea has subsided. -No diarrhea overnight.   7. Generalized weakness-seen by physical therapy and recommended short-term rehabilitation and will likely discharged tomorrow.  8. Vaginal bleeding status post endometrial biopsy at the bedside by Dr. Leonides Schanz recommended Megace at 80 mg twice daily dose, awaiting for cytology. Patient may need to have repeated the endometrial biopsy in 2 weeks as outpatient if sample is nondiagnostic. CT scan of abdomen is concerning for thickened endometrium per GYN specialist.  -Hemoglobin stable no further bleeding presently.   DRUG ALLERGIES:  Allergies  Allergen Reactions  . Sulfa Antibiotics Hives  . Tylenol [Acetaminophen] Other (See Comments)    Has cirrhosis of the liver secondary to Tylenol use.  . Vioxx [Rofecoxib] Hives and Itching  . Aspirin Palpitations    CODE STATUS:     Code Status Orders  Start     Ordered   05/10/15 2254  Do not attempt resuscitation (DNR)   Continuous    Question Answer Comment  In the event of cardiac or respiratory ARREST Do not call a "code blue"   In the event of cardiac or respiratory ARREST Do not perform Intubation, CPR, defibrillation or ACLS   In the event of cardiac or respiratory ARREST Use medication by any route, position, wound care, and other measures to relive pain and suffering. May use oxygen, suction and manual treatment of airway obstruction as needed for comfort.      05/10/15 2253      TOTAL TIME TAKING CARE OF THIS PATIENT: 30 minutes.   Likely discharged to skilled nursing facility tomorrow  Henreitta Leber M.D on 05/18/2015 at 5:05 PM  Between 7am to 6pm - Pager - [phone]  After 6pm go to  [email or web address] - password [email or web address] [facility]  [facility]  Office  [phone]  CC: Primary care physician; No primary care provider on file.

## 2015-05-18 NOTE — Progress Notes (Signed)
Patient rested quietly most of the day. Occasional nausea relieved by PRN medications. No other complaints. Sinus rhythm with occasional PVCs. Possible discharge in the next day or so to rehab. Patient seems to be in good spirits today.

## 2015-05-18 NOTE — Progress Notes (Signed)
Physical Therapy Treatment Patient Details Name: KIMBELLA HEISLER MRN: [id number] DOB: [birth year] Today's Date: 05/18/2015    History of Present Illness Pt was admitted to hospital with vommiting, signs of tachypnea, and was febrile.  Pt was diagnosed with sepsis secondary to spontaneous bacterial perionitis/gall stones/gallbladder disease.     PT Comments    Pt notes she is feeling better today than yesterday regarding bleeding in vaginal area. Pt does; however, complains of significant pain on her bottom noting that pressure increases pain. Pt did not wish in chair due to this; also refuses walking in the room. Pt agreeable to bed exercises as noted below. Pt education as well on rolling and bridging for pressure relief on bottom.   Follow Up Recommendations  SNF     Equipment Recommendations  Rolling walker with 5" wheels    Recommendations for Other Services       Precautions / Restrictions Precautions Precautions: Fall Restrictions Weight Bearing Restrictions: No    Mobility  Bed Mobility Overal bed mobility: Needs Assistance (rolls enough to shift weight off bottom with mod I ) Bed Mobility: Rolling Rolling: Modified independent (Device/Increase time) (using rails)         General bed mobility comments: did not wish out of bed/in chair due to too much pressure on bottom  Transfers                    Ambulation/Gait                 Stairs            Wheelchair Mobility    Modified Rankin (Stroke Patients Only)       Balance                                    Cognition Arousal/Alertness: Awake/alert Behavior During Therapy: WFL for tasks assessed/performed Overall Cognitive Status: Within Functional Limits for tasks assessed                      Exercises General Exercises - Lower Extremity Ankle Circles/Pumps: AROM;Both;20 reps;Supine Quad Sets: Strengthening;Both;20 reps;Supine Short Arc Quad:  AROM;Both;20 reps;Supine Heel Slides: AROM;Both;20 reps;Supine Hip ABduction/ADduction: AROM;Both;20 reps;Supine Straight Leg Raises: AAROM;Both;20 reps;Supine Other Exercises Other Exercises: bridging partial range 15x    General Comments        Pertinent Vitals/Pain Pain Assessment: 0-10 Pain Score: 9  Pain Location: Bottom Pain Descriptors / Indicators: Burning;Aching (better if on side)    Home Living                      Prior Function            PT Goals (current goals can now be found in the care plan section) Progress towards PT goals: Progressing toward goals    Frequency  Min 2X/week    PT Plan Current plan remains appropriate    Co-evaluation             End of Session   Activity Tolerance: Patient tolerated treatment well;Patient limited by fatigue Patient left: in bed;with call bell/phone within reach;with bed alarm set     Time: 8841-6606 PT Time Calculation (min) (ACUTE ONLY): 24 min  Charges:  $Therapeutic Exercise: 23-37 mins                    G Codes:  Erline Levine Bishop 05/18/2015, 11:58 AM

## 2015-05-19 LAB — BASIC METABOLIC PANEL
ANION GAP: 4 — AB (ref 5–15)
BUN: 18 mg/dL (ref 6–20)
CALCIUM: 8.4 mg/dL — AB (ref 8.9–10.3)
CO2: 27 mmol/L (ref 22–32)
Chloride: 109 mmol/L (ref 101–111)
Creatinine, Ser: 2.11 mg/dL — ABNORMAL HIGH (ref 0.44–1.00)
GFR calc non Af Amer: 21 mL/min — ABNORMAL LOW (ref >60)
GFR, EST AFRICAN AMERICAN: 24 mL/min — AB (ref >60)
Glucose, Bld: 127 mg/dL — ABNORMAL HIGH (ref 65–99)
Potassium: 3.5 mmol/L (ref 3.5–5.1)
SODIUM: 140 mmol/L (ref 135–145)

## 2015-05-19 LAB — GLUCOSE, CAPILLARY
GLUCOSE-CAPILLARY: 117 mg/dL — AB (ref 65–99)
GLUCOSE-CAPILLARY: 120 mg/dL — AB (ref 65–99)
Glucose-Capillary: 131 mg/dL — ABNORMAL HIGH (ref 65–99)

## 2015-05-19 LAB — CBC
HCT: 29.7 % — ABNORMAL LOW (ref 35.0–47.0)
HEMOGLOBIN: 9.7 g/dL — AB (ref 12.0–16.0)
MCH: 31.3 pg (ref 26.0–34.0)
MCHC: 32.5 g/dL (ref 32.0–36.0)
MCV: 96.1 fL (ref 80.0–100.0)
Platelets: 116 10*3/uL — ABNORMAL LOW (ref 150–440)
RBC: 3.09 MIL/uL — AB (ref 3.80–5.20)
RDW: 16.3 % — ABNORMAL HIGH (ref 11.5–14.5)
WBC: 7.5 10*3/uL (ref 3.6–11.0)

## 2015-05-19 MED ORDER — INSULIN ASPART 100 UNIT/ML ~~LOC~~ SOLN
0.0000 [IU] | Freq: Every day | SUBCUTANEOUS | Status: DC
Start: 2015-05-19 — End: 2015-05-19

## 2015-05-19 MED ORDER — ZINC OXIDE 40 % EX OINT: TOPICAL_OINTMENT | CUTANEOUS | Status: AC | PRN

## 2015-05-19 MED ORDER — INSULIN ASPART 100 UNIT/ML ~~LOC~~ SOLN: 0.0000 [IU] | Freq: Three times a day (TID) | SUBCUTANEOUS | Status: AC

## 2015-05-19 MED ORDER — MEGESTROL ACETATE 40 MG PO TABS: 80.0000 mg | ORAL_TABLET | Freq: Two times a day (BID) | ORAL | Status: AC

## 2015-05-19 MED ORDER — OXYCODONE HCL 5 MG PO TABS: 5.0000 mg | ORAL_TABLET | Freq: Three times a day (TID) | ORAL | Status: AC | PRN

## 2015-05-19 MED ORDER — INSULIN ASPART 100 UNIT/ML ~~LOC~~ SOLN
0.0000 [IU] | Freq: Three times a day (TID) | SUBCUTANEOUS | Status: DC
  Administered 2015-05-19: 1 [IU] via SUBCUTANEOUS
  Filled 2015-05-19: qty 1

## 2015-05-19 MED ORDER — METRONIDAZOLE 500 MG PO TABS: 500.0000 mg | ORAL_TABLET | Freq: Three times a day (TID) | ORAL | Status: AC

## 2015-05-19 MED ORDER — ALPRAZOLAM 0.25 MG PO TABS: 0.2500 mg | ORAL_TABLET | Freq: Every evening | ORAL | Status: AC | PRN

## 2015-05-19 MED ORDER — INSULIN ASPART 100 UNIT/ML ~~LOC~~ SOLN: 0.0000 [IU] | Freq: Every day | SUBCUTANEOUS | Status: AC

## 2015-05-19 NOTE — Discharge Instructions (Signed)
°  DIET:  Cardiac diet and Diabetic diet  DISCHARGE CONDITION:  Stable  ACTIVITY:  Activity as tolerated  OXYGEN:  Home Oxygen: No.   Oxygen Delivery: room air  DISCHARGE LOCATION:  nursing home   If you experience worsening of your admission symptoms, develop shortness of breath, life threatening emergency, suicidal or homicidal thoughts you must seek medical attention immediately by calling 911 or calling your MD immediately  if symptoms less severe.  You Must read complete instructions/literature along with all the possible adverse reactions/side effects for all the Medicines you take and that have been prescribed to you. Take any new Medicines after you have completely understood and accpet all the possible adverse reactions/side effects.   Please note  You were cared for by a hospitalist during your hospital stay. If you have any questions about your discharge medications or the care you received while you were in the hospital after you are discharged, you can call the unit and asked to speak with the hospitalist on call if the hospitalist that took care of you is not available. Once you are discharged, your primary care physician will handle any further medical issues. Please note that NO REFILLS for any discharge medications will be authorized once you are discharged, as it is imperative that you return to your primary care physician (or establish a relationship with a primary care physician if you do not have one) for your aftercare needs so that they can reassess your need for medications and monitor your lab values.

## 2015-05-19 NOTE — Discharge Summary (Signed)
[facility] [place] NAME: Becky Roberts    MR#:  [id number]  DATE OF BIRTH:  Feb 04, 1932  DATE OF ADMISSION:  05/10/2015 ADMITTING PHYSICIAN: Lance Coon, MD  DATE OF DISCHARGE: 05/19/2015  PRIMARY CARE PHYSICIAN: No primary care provider on file.    ADMISSION DIAGNOSIS:  Spontaneous bacterial peritonitis [K65.2] Sepsis, due to unspecified organism [A41.9]  DISCHARGE DIAGNOSIS:  Principal Problem:   Sepsis Active Problems:   Type II diabetes mellitus   Hypertension   COPD (chronic obstructive pulmonary disease)   Breast cancer   Chronic diastolic congestive heart failure   Restless legs syndrome   Cirrhosis of liver not due to alcohol   SBP (spontaneous bacterial peritonitis)   Malnutrition of moderate degree   SECONDARY DIAGNOSIS:   Past Medical History  Diagnosis Date  . Blood transfusion reaction   . Diabetes mellitus   . Depression   . Hypertension   . Anemia 2012    hgb 5.5, admitted,  workup multifactorial,  b12 deficiency  . Arthritis   . Asthma   . Portal hypertension February 2014    CT suggested a small nodular liver, multiple varices adjacent to the spleen and inferior mediastinum and paraesophageal region. No ascites.  . Breast cancer July 2,2013    T1c,N0,M0. ER + PR- , HER-2/neu not overexpressing..  . Cirrhosis   . Cholelithiases   . CKD (chronic kidney disease) stage 3, GFR 30-59 ml/min     HOSPITAL COURSE:   81 year old female with past medical history of liver cirrhosis, diabetes, hypertension, chronic kidney disease stage III, history of cholelithiasis, depression, hypertension, chronic anemia, who presented to the hospital due to abdominal pain nausea vomiting.  1. Spontaneous bacterial peritonitis-this was likely cause of patient's abdominal pain nausea and vomiting. Patient had a diagnostic paracentesis done which was positive for peritonitis. Although the fluid culture did not grow  any organisms. -Patient was treated with IV Rocephin and has finished a 6 day course. Patient was seen by infectious disease who agreed with this management. Presently patient has no abdominal pain, nausea, vomiting.  2. Bacteremia due to Staphylococcus aureus epidermidis, this was likely contamination.  Patient had repeat blood cultures taken 11th of August 2016 which were negative. Patient was taken off vancomycin. Echocardiogram was unremarkable with no valvular involvement -Patient was seen by infectious disease and he agreed with this management.  3. Acute on Chronic renal failure, likely ATN due to hypotension with diarrhea, UA was unremarkable, not suggestive of urinary tract infection, received few days of albumin infusion, IV fluids and kidney function much improved and close to baseline. -Patient is No longer hypotensive. Patient was seen by nephrology and patient is stable from their standpoint to be discharged to skilled nursing facility. Patient will follow-up with them as an outpatient in the next 2 weeks.  4. Hypokalemia-improved and resolved with supplementation  5. Ascites -treated empirically for SBP. No evidence of tense ascites presently.  -Patient will resume her Lasix upon discharge which was held due to acute on chronic renal failure.  6. C. difficile enterocolitis-patient's C. difficile antigen was positive. Although the PCR was negative. Patient did have diarrhea and was symptomatic and therefore is being discharged on oral Flagyl presently. -Patient presently has no acute diarrhea and has clinically improved significantly.  7. Generalized weakness-seen by physical therapy and recommended short-term rehabilitation and patient is being discharged in a presently.  8. Vaginal bleeding status post endometrial biopsy at the bedside  by Dr. Leonides Schanz recommended Megace at 80 mg twice daily dose, awaiting for cytology. Patient may need to have repeated the endometrial biopsy in 2  weeks as outpatient if sample is nondiagnostic. CT scan of abdomen is concerning for thickened endometrium per GYN specialist.  -Hemoglobin stable no further bleeding presently.  #9 diabetes type 2 with renal complication-patient's blood sugars have remained stable. Given her renal failure presently I'm discharging patient only on long acting Levemir and SSI coverage. Patient apparently was on high doses of 70/30 insulin which I'm discontinuing at this point but they can be resumed once her by mouth intake improves and her blood sugars can be followed at the skilled nursing facility.  DISCHARGE CONDITIONS:   Stable  CONSULTS OBTAINED:  Treatment Team:  Murlean Iba, MD  DRUG ALLERGIES:   Allergies  Allergen Reactions  . Sulfa Antibiotics Hives  . Tylenol [Acetaminophen] Other (See Comments)    Has cirrhosis of the liver secondary to Tylenol use.  . Vioxx [Rofecoxib] Hives and Itching  . Aspirin Palpitations    DISCHARGE MEDICATIONS:   Current Discharge Medication List    START taking these medications   Details  liver oil-zinc oxide (DESITIN) 40 % ointment Apply topically as needed for irritation. Qty: 56.7 g, Refills: 0    megestrol (MEGACE) 40 MG tablet Take 2 tablets (80 mg total) by mouth 2 (two) times daily.    metroNIDAZOLE (FLAGYL) 500 MG tablet Take 1 tablet (500 mg total) by mouth every 8 (eight) hours.      CONTINUE these medications which have CHANGED   Details  ALPRAZolam (XANAX) 0.25 MG tablet Take 1 tablet (0.25 mg total) by mouth at bedtime as needed for anxiety. Qty: 15 tablet, Refills: 0    oxyCODONE (OXY IR/ROXICODONE) 5 MG immediate release tablet Take 1 tablet (5 mg total) by mouth every 8 (eight) hours as needed for moderate pain. Qty: 30 tablet, Refills: 0      CONTINUE these medications which have NOT CHANGED   Details  albuterol (PROVENTIL HFA;VENTOLIN HFA) 108 (90 BASE) MCG/ACT inhaler Inhale 2 puffs into the lungs every 6 (six) hours as  needed for wheezing. Qty: 1 Inhaler, Refills: 11    Calcium Carbonate-Vitamin D (CALTRATE 600+D PO) Take 2 tablets by mouth daily.    chlorhexidine (PERIDEX) 0.12 % solution Use as directed 15 mLs in the mouth or throat 2 (two) times daily. Qty: 120 mL, Refills: 2   Associated Diagnoses: Gingivitis, chronic    cyanocobalamin (,VITAMIN B-12,) 1000 MCG/ML injection Inject 1 mL (1,000 mcg total) into the muscle every 30 (thirty) days. Qty: 10 mL, Refills: 12    diazepam (VALIUM) 2 MG tablet Take 2 mg by mouth 3 (three) times daily as needed for anxiety.    exemestane (AROMASIN) 25 MG tablet Take 25 mg by mouth daily.     furosemide (LASIX) 20 MG tablet Take 1 tablet (20 mg total) by mouth daily. Qty: 30 tablet, Refills: 0    insulin detemir (LEVEMIR) 100 UNIT/ML injection 16 to 20 units daily Qty: 15 mL, Refills: 12    metoCLOPramide (REGLAN) 10 MG tablet Take 10 mg by mouth 3 (three) times daily before meals.     metoprolol tartrate (LOPRESSOR) 25 MG tablet TAKE ONE TABLET TWICE DAILY Qty: 180 tablet, Refills: 3    nadolol (CORGARD) 20 MG tablet Take 1 tablet (20 mg total) by mouth daily. Qty: 30 tablet, Refills: 0    nystatin (MYCOSTATIN) 100000 UNIT/ML suspension Use as directed  5 mLs (500,000 Units total) in the mouth or throat 4 (four) times daily. Qty: 120 mL, Refills: 0    omeprazole (PRILOSEC) 20 MG capsule Take 20 mg by mouth daily.    rOPINIRole (REQUIP) 0.5 MG tablet Take 1 mg by mouth at bedtime.    simvastatin (ZOCOR) 40 MG tablet Take 40 mg by mouth at bedtime.    Vitamin D, Ergocalciferol, (DRISDOL) 50000 UNITS CAPS capsule Take 50,000 Units by mouth every 7 (seven) days. Pt takes on Monday.      STOP taking these medications     ciprofloxacin (CIPRO) 250 MG tablet      insulin aspart protamine- aspart (NOVOLOG MIX 70/30) (70-30) 100 UNIT/ML injection      losartan (COZAAR) 100 MG tablet          DISCHARGE INSTRUCTIONS:   DIET:  Cardiac diet and  Diabetic diet  DISCHARGE CONDITION:  Stable  ACTIVITY:  Activity as tolerated  OXYGEN:  Home Oxygen: No.   Oxygen Delivery: room air  DISCHARGE LOCATION:  nursing home   If you experience worsening of your admission symptoms, develop shortness of breath, life threatening emergency, suicidal or homicidal thoughts you must seek medical attention immediately by calling 911 or calling your MD immediately  if symptoms less severe.  You Must read complete instructions/literature along with all the possible adverse reactions/side effects for all the Medicines you take and that have been prescribed to you. Take any new Medicines after you have completely understood and accpet all the possible adverse reactions/side effects.   Please note  You were cared for by a hospitalist during your hospital stay. If you have any questions about your discharge medications or the care you received while you were in the hospital after you are discharged, you can call the unit and asked to speak with the hospitalist on call if the hospitalist that took care of you is not available. Once you are discharged, your primary care physician will handle any further medical issues. Please note that NO REFILLS for any discharge medications will be authorized once you are discharged, as it is imperative that you return to your primary care physician (or establish a relationship with a primary care physician if you do not have one) for your aftercare needs so that they can reassess your need for medications and monitor your lab values.     Today   Patient denies any abdominal pain, nausea, vomiting, diarrhea. Stable for discharged to SNF.   VITAL SIGNS:  Blood pressure 124/58, pulse 80, temperature 98.2 F (36.8 C), temperature source Oral, resp. rate 18, height 5' 2"  (1.575 m), weight 95.709 kg (211 lb), SpO2 95 %.  I/O:    Intake/Output Summary (Last 24 hours) at 05/19/15 1057 Last data filed at 05/19/15 0830   Gross per 24 hour  Intake      0 ml  Output      0 ml  Net      0 ml    PHYSICAL EXAMINATION:  GENERAL:  81 y.o.-year-old patient lying in the bed with no acute distress.  EYES: Pupils equal, round, reactive to light and accommodation. No scleral icterus. Extraocular muscles intact.  HEENT: Head atraumatic, normocephalic. Oropharynx and nasopharynx clear.  NECK:  Supple, no jugular venous distention. No thyroid enlargement, no tenderness.  LUNGS: Normal breath sounds bilaterally, no wheezing, rales,rhonchi. No use of accessory muscles of respiration.  CARDIOVASCULAR: S1, S2 normal. No murmurs, rubs, or gallops.  ABDOMEN: Soft, non-tender, Distended and +  for fluid wave and ascites. Bowel sounds present. No organomegaly or mass.  EXTREMITIES: No pedal edema, cyanosis, or clubbing. + 2 pedal & radial pulses b/l.  NEUROLOGIC: Cranial nerves II through XII are intact. No focal motor or sensory defecits b/l. Globally weak.   PSYCHIATRIC: The patient is alert and oriented x 3. Good affect.  SKIN: No obvious rash, lesion, or ulcer.   DATA REVIEW:   CBC  Recent Labs Lab 05/19/15 0359  WBC 7.5  HGB 9.7*  HCT 29.7*  PLT 116*    Chemistries   Recent Labs Lab 05/14/15 0403  05/15/15 0923  05/19/15 0359  NA 138  < >  --   < > 140  K 3.4*  < >  --   < > 3.5  CL 108  < >  --   < > 109  CO2 26  < >  --   < > 27  GLUCOSE 106*  < >  --   < > 127*  BUN 29*  < >  --   < > 18  CREATININE 2.97*  < >  --   < > 2.11*  CALCIUM 7.3*  < >  --   < > 8.4*  MG  --   --  2.3  --   --   AST 42*  --   --   --   --   ALT 16  --   --   --   --   ALKPHOS 63  --   --   --   --   BILITOT 0.8  --   --   --   --   < > = values in this interval not displayed.  Cardiac Enzymes No results for input(s): TROPONINI in the last 168 hours.  Microbiology Results  Results for orders placed or performed during the hospital encounter of 05/10/15  Body fluid culture     Status: None   Collection Time:  05/10/15  6:30 PM  Result Value Ref Range Status   Specimen Description PERITONEAL  Final   Special Requests Normal  Final   Gram Stain MANY WBC SEEN NO ORGANISMS SEEN   Final   Culture NO GROWTH 4 DAYS  Final   Report Status 05/14/2015 FINAL  Final  Blood culture (routine x 2)     Status: None   Collection Time: 05/10/15  6:58 PM  Result Value Ref Range Status   Specimen Description BLOOD LEFT ANTECUBITAL  Final   Special Requests BOTTLES DRAWN AEROBIC AND ANAEROBIC 5ML  Final   Culture  Setup Time   Final    GRAM POSITIVE COCCI IN BOTH AEROBIC AND ANAEROBIC BOTTLES PREVIOUSLY CALLED TO DOLL FERGUSON AT 5732 05/11/15 BY TB CONFIRMED BY TB    Culture   Final    STAPHYLOCOCCUS EPIDERMIDIS IN BOTH AEROBIC AND ANAEROBIC BOTTLES WARNING: For oxacillin-resistant S.aureus and coagulase-negative staphylococci (MRS), other beta-lactam agents, ie, penicillins, beta-lactam/beta-lactamase inhibitor combinations, cephems (with the exception of the cephalosporins with anti-MRSA  activity), and carbapenems, may appear active in vitro, but are not effective clinically.  --CLSI, Vol.32 No.3, January 2012, pg 70.    Report Status 05/13/2015 FINAL  Final   Organism ID, Bacteria STAPHYLOCOCCUS EPIDERMIDIS  Final      Susceptibility   Staphylococcus epidermidis - MIC*    CIPROFLOXACIN >=8 RESISTANT Resistant     ERYTHROMYCIN >=8 RESISTANT Resistant     GENTAMICIN <=0.5 SENSITIVE Sensitive     OXACILLIN Value  in next row Resistant      >=4 RESISTANTWARNING: For oxacillin-resistant S.aureus and coagulase-negative staphylococci (MRS), other beta-lactam agents, ie, penicillins, beta-lactam/beta-lactamase inhibitor combinations, cephems (with the exception of the cephalosporins with anti-MRSA activity), and carbapenems, may appear active in vitro, but are not effective clinically.  --CLSI, Vol.32 No.3, January 2012, pg 70.    TETRACYCLINE Value in next row Sensitive      >=4 RESISTANTWARNING: For  oxacillin-resistant S.aureus and coagulase-negative staphylococci (MRS), other beta-lactam agents, ie, penicillins, beta-lactam/beta-lactamase inhibitor combinations, cephems (with the exception of the cephalosporins with anti-MRSA activity), and carbapenems, may appear active in vitro, but are not effective clinically.  --CLSI, Vol.32 No.3, January 2012, pg 70.    VANCOMYCIN Value in next row Sensitive      >=4 RESISTANTWARNING: For oxacillin-resistant S.aureus and coagulase-negative staphylococci (MRS), other beta-lactam agents, ie, penicillins, beta-lactam/beta-lactamase inhibitor combinations, cephems (with the exception of the cephalosporins with anti-MRSA activity), and carbapenems, may appear active in vitro, but are not effective clinically.  --CLSI, Vol.32 No.3, January 2012, pg 70.    CLINDAMYCIN Value in next row Sensitive      >=4 RESISTANTWARNING: For oxacillin-resistant S.aureus and coagulase-negative staphylococci (MRS), other beta-lactam agents, ie, penicillins, beta-lactam/beta-lactamase inhibitor combinations, cephems (with the exception of the cephalosporins with anti-MRSA activity), and carbapenems, may appear active in vitro, but are not effective clinically.  --CLSI, Vol.32 No.3, January 2012, pg 70.    LINEZOLID Value in next row Sensitive      SENSITIVE2    CEFAZOLIN Value in next row Resistant      SENSITIVE2    * STAPHYLOCOCCUS EPIDERMIDIS  Blood culture (routine x 2)     Status: None   Collection Time: 05/10/15  6:58 PM  Result Value Ref Range Status   Specimen Description BLOOD LEFT WRIST  Final   Special Requests BOTTLES DRAWN AEROBIC AND ANAEROBIC 2ML  Final   Culture  Setup Time   Final    GRAM POSITIVE COCCI ANAEROBIC BOTTLE ONLY CRITICAL RESULT CALLED TO, READ BACK BY AND VERIFIED WITH: DOLL FERGUSON ON 05/11/15 AT 58 PM BY TB. CONFIRMED BY TB/SDR    Culture   Final    STAPHYLOCOCCUS EPIDERMIDIS WARNING: For oxacillin-resistant S.aureus and coagulase-negative  staphylococci (MRS), other beta-lactam agents, ie, penicillins, beta-lactam/beta-lactamase inhibitor combinations, cephems (with the exception of the cephalosporins with anti-MRSA  activity), and carbapenems, may appear active in vitro, but are not effective clinically.  --CLSI, Vol.32 No.3, January 2012, pg 70.    Report Status 05/14/2015 FINAL  Final   Organism ID, Bacteria STAPHYLOCOCCUS EPIDERMIDIS  Final      Susceptibility   Staphylococcus epidermidis - MIC*    CIPROFLOXACIN >=8 RESISTANT Resistant     ERYTHROMYCIN >=8 RESISTANT Resistant     GENTAMICIN >=16 RESISTANT Resistant     OXACILLIN Value in next row Resistant      >=4 RESISTANTWARNING: For oxacillin-resistant S.aureus and coagulase-negative staphylococci (MRS), other beta-lactam agents, ie, penicillins, beta-lactam/beta-lactamase inhibitor combinations, cephems (with the exception of the cephalosporins with anti-MRSA activity), and carbapenems, may appear active in vitro, but are not effective clinically.  --CLSI, Vol.32 No.3, January 2012, pg 70.    TETRACYCLINE Value in next row Resistant      >=4 RESISTANTWARNING: For oxacillin-resistant S.aureus and coagulase-negative staphylococci (MRS), other beta-lactam agents, ie, penicillins, beta-lactam/beta-lactamase inhibitor combinations, cephems (with the exception of the cephalosporins with anti-MRSA activity), and carbapenems, may appear active in vitro, but are not effective clinically.  --CLSI, Vol.32 No.3, January 2012, pg 70.  VANCOMYCIN Value in next row Sensitive      >=4 RESISTANTWARNING: For oxacillin-resistant S.aureus and coagulase-negative staphylococci (MRS), other beta-lactam agents, ie, penicillins, beta-lactam/beta-lactamase inhibitor combinations, cephems (with the exception of the cephalosporins with anti-MRSA activity), and carbapenems, may appear active in vitro, but are not effective clinically.  --CLSI, Vol.32 No.3, January 2012, pg 70.    CLINDAMYCIN Value in  next row Resistant      >=4 RESISTANTWARNING: For oxacillin-resistant S.aureus and coagulase-negative staphylococci (MRS), other beta-lactam agents, ie, penicillins, beta-lactam/beta-lactamase inhibitor combinations, cephems (with the exception of the cephalosporins with anti-MRSA activity), and carbapenems, may appear active in vitro, but are not effective clinically.  --CLSI, Vol.32 No.3, January 2012, pg 70.    LINEZOLID Value in next row Sensitive      SENSITIVE1    CEFAZOLIN Value in next row Resistant      SENSITIVE1    LEVOFLOXACIN Value in next row Resistant      RESISTANT>=8    * STAPHYLOCOCCUS EPIDERMIDIS  C difficile quick scan w PCR reflex     Status: Abnormal   Collection Time: 05/11/15  6:35 PM  Result Value Ref Range Status   C Diff antigen POSITIVE (A) NEGATIVE Final   C Diff toxin NEGATIVE NEGATIVE Final   C Diff interpretation   Final    Negative for toxigenic C. difficile. Toxin gene and active toxin production not detected. May be a nontoxigenic strain of C. difficile bacteria present, lacking the ability to produce toxin.  Clostridium Difficile by PCR     Status: None   Collection Time: 05/11/15  6:35 PM  Result Value Ref Range Status   Toxigenic C Difficile by pcr NEGATIVE NEGATIVE Final  Culture, blood (routine x 2)     Status: None   Collection Time: 05/12/15  9:35 AM  Result Value Ref Range Status   Specimen Description BLOOD LEFT WRIST  Final   Special Requests BOTTLES DRAWN AEROBIC AND ANAEROBIC  3 CC  Final   Culture NO GROWTH 5 DAYS  Final   Report Status 05/17/2015 FINAL  Final  Culture, blood (routine x 2)     Status: None   Collection Time: 05/12/15  9:35 AM  Result Value Ref Range Status   Specimen Description BLOOD LEFT ASSIST CONTROL  Final   Special Requests BOTTLES DRAWN AEROBIC AND ANAEROBIC  5 CC  Final   Culture NO GROWTH 5 DAYS  Final   Report Status 05/17/2015 FINAL  Final    RADIOLOGY:  No results found.    Management plans discussed  with the patient, family and they are in agreement.  CODE STATUS:     Code Status Orders        Start     Ordered   05/10/15 2254  Do not attempt resuscitation (DNR)   Continuous    Question Answer Comment  In the event of cardiac or respiratory ARREST Do not call a "code blue"   In the event of cardiac or respiratory ARREST Do not perform Intubation, CPR, defibrillation or ACLS   In the event of cardiac or respiratory ARREST Use medication by any route, position, wound care, and other measures to relive pain and suffering. May use oxygen, suction and manual treatment of airway obstruction as needed for comfort.      05/10/15 2253      TOTAL TIME TAKING CARE OF THIS PATIENT: 45 minutes.    Henreitta Leber M.D on 05/19/2015 at 10:57 AM  Between 7am to 6pm -  Pager - [phone]  After 6pm go to [email or web address] - password [email or web address] [facility]  [facility]  Office  [phone]  CC: Primary care physician; No primary care provider on file.

## 2015-05-19 NOTE — Clinical Social Work Placement (Signed)
   CLINICAL SOCIAL WORK PLACEMENT  NOTE  Date:  05/19/2015  Patient Details  Name: Becky Roberts MRN: [id number] Date of Birth: Aug 01, 1932  Clinical Social Work is seeking post-discharge placement for this patient at the [facility] level of care (*CSW will initial, date and re-position this form in  chart as items are completed):  Yes   Patient/family provided with [facility] Work Department's list of facilities offering this level of care within the geographic area requested by the patient (or if unable, by the patient's family).  Yes   Patient/family informed of their freedom to choose among providers that offer the needed level of care, that participate in Medicare, Medicaid or managed care program needed by the patient, have an available bed and are willing to accept the patient.  Yes   Patient/family informed of [facility]'s ownership interest in [facility] and [facility], as well as of the fact that they are under no obligation to receive care at these facilities.  PASRR submitted to EDS on 05/16/15     PASRR number received on 05/16/15     Existing PASRR number confirmed on       FL2 transmitted to all facilities in geographic area requested by pt/family on 05/16/15     FL2 transmitted to all facilities within larger geographic area on       Patient informed that his/her managed care company has contracts with or will negotiate with certain facilities, including the following:        Yes   Patient/family informed of bed offers received.  Patient chooses bed at  [facility])     Physician recommends and patient chooses bed at  [facility]    Patient to be transferred to  [facility]) on 05/19/15.  Patient to be transferred to facility by  (EMS)     Patient family notified on 05/19/15 of transfer.  Name of family member notified:        PHYSICIAN Please sign FL2     Additional Comment:     _______________________________________________ Shela Leff, LCSW 05/19/2015, 11:54 AM

## 2015-05-19 NOTE — Plan of Care (Signed)
Problem: Discharge Progression Outcomes Goal: Pain controlled with appropriate interventions Outcome: Completed/Met Date Met:  05/19/15 Pt is alert and oriented x 4, c/o back pain improved with roxicodone, on room air, fair appetite, loose bm throughout shift, on iso for c. Diff, flagyl po given, c/o nausea x 1 improved with zofran, daughter at bedside, bedridden, pt is d/c to [facility], pt to be transported via ems. Uneventful shift.

## 2015-05-19 NOTE — Progress Notes (Signed)
[facility]  ROUNDING NOTE   Subjective:  Cr only slightly lower today at 2.1. Will need outpt follow up as shes still not yet back to her baseline.  Objective:  Vital signs in last 24 hours:  Temp:  [98 F (36.7 C)-98.2 F (36.8 C)] 98.1 F (36.7 C) (08/18 1101) Pulse Rate:  [80-87] 87 (08/18 1101) Resp:  [16-18] 18 (08/18 1101) BP: (124-133)/(44-59) 133/44 mmHg (08/18 1101) SpO2:  [95 %-97 %] 97 % (08/18 1101) Weight:  [95.709 kg (211 lb)] 95.709 kg (211 lb) (08/18 9449)  Weight change: 2.486 kg (5 lb 7.7 oz) Filed Weights   05/17/15 0609 05/18/15 0614 05/19/15 6759  Weight: 92.761 kg (204 lb 8 oz) 93.223 kg (205 lb 8.3 oz) 95.709 kg (211 lb)    Intake/Output:     Intake/Output this shift:     Physical Exam: General: NAD  Head: Normocephalic, atraumatic. Moist oral mucosal membranes  Eyes: Anicteric  Neck: Supple, trachea midline  Lungs:  Clear to auscultation normal effort  Heart: Regular rate and rhythm  Abdomen:  +ascites, non tender  Extremities:  1+peripheral and dependent edema.  Neurologic: Nonfocal, moving all four extremities  Skin: No lesions       Basic Metabolic Panel:  Recent Labs Lab 05/13/15 0724 05/13/15 1722 05/14/15 0403 05/15/15 0405 05/15/15 0923 05/16/15 0613 05/17/15 0404 05/18/15 0407 05/19/15 0359  NA 139 137 138 138  --   --  143  --  140  K 3.6 3.9 3.4* 3.2*  --   --  3.8  --  3.5  CL 106 107 108 106  --   --  112*  --  109  CO2 26 26 26 27   --   --  27  --  27  GLUCOSE 93 117* 106* 112*  --   --  135*  --  127*  BUN 28* 29* 29* 28*  --   --  22*  --  18  CREATININE 3.15* 3.02* 2.97* 2.90*  --  2.72* 2.48* 2.23* 2.11*  CALCIUM 7.4* 7.6* 7.3* 7.9*  --   --  8.3*  --  8.4*  MG 1.4*  --   --   --  2.3  --   --   --   --   PHOS  --   --  3.6  --   --   --   --   --   --     Liver Function Tests:  Recent Labs Lab 05/14/15 0403  AST 42*  ALT 16  ALKPHOS 63  BILITOT 0.8  PROT 4.7*  ALBUMIN 1.9*   No  results for input(s): LIPASE, AMYLASE in the last 168 hours.  Recent Labs Lab 05/15/15 0923  AMMONIA 32    CBC:  Recent Labs Lab 05/13/15 0724 05/14/15 0403 05/18/15 0407 05/19/15 0359  WBC 8.1  --   --  7.5  HGB 9.5* 10.0* 10.0* 9.7*  HCT 28.8*  --   --  29.7*  MCV 96.6  --   --  96.1  PLT 82*  --   --  116*    Cardiac Enzymes: No results for input(s): CKTOTAL, CKMB, CKMBINDEX, TROPONINI in the last 168 hours.  BNP: Invalid input(s): POCBNP  CBG:  Recent Labs Lab 05/18/15 1637 05/18/15 1930 05/18/15 2158 05/19/15 0744 05/19/15 1133  GLUCAP 129* 106* 127* 117* 131*    Microbiology: Results for orders placed or performed during the hospital encounter of 05/10/15  Body  fluid culture     Status: None   Collection Time: 05/10/15  6:30 PM  Result Value Ref Range Status   Specimen Description PERITONEAL  Final   Special Requests Normal  Final   Gram Stain MANY WBC SEEN NO ORGANISMS SEEN   Final   Culture NO GROWTH 4 DAYS  Final   Report Status 05/14/2015 FINAL  Final  Blood culture (routine x 2)     Status: None   Collection Time: 05/10/15  6:58 PM  Result Value Ref Range Status   Specimen Description BLOOD LEFT ANTECUBITAL  Final   Special Requests BOTTLES DRAWN AEROBIC AND ANAEROBIC 5ML  Final   Culture  Setup Time   Final    GRAM POSITIVE COCCI IN BOTH AEROBIC AND ANAEROBIC BOTTLES PREVIOUSLY CALLED TO DOLL FERGUSON AT 5102 05/11/15 BY TB CONFIRMED BY TB    Culture   Final    STAPHYLOCOCCUS EPIDERMIDIS IN BOTH AEROBIC AND ANAEROBIC BOTTLES WARNING: For oxacillin-resistant S.aureus and coagulase-negative staphylococci (MRS), other beta-lactam agents, ie, penicillins, beta-lactam/beta-lactamase inhibitor combinations, cephems (with the exception of the cephalosporins with anti-MRSA  activity), and carbapenems, may appear active in vitro, but are not effective clinically.  --CLSI, Vol.32 No.3, January 2012, pg 70.    Report Status 05/13/2015 FINAL  Final    Organism ID, Bacteria STAPHYLOCOCCUS EPIDERMIDIS  Final      Susceptibility   Staphylococcus epidermidis - MIC*    CIPROFLOXACIN >=8 RESISTANT Resistant     ERYTHROMYCIN >=8 RESISTANT Resistant     GENTAMICIN <=0.5 SENSITIVE Sensitive     OXACILLIN Value in next row Resistant      >=4 RESISTANTWARNING: For oxacillin-resistant S.aureus and coagulase-negative staphylococci (MRS), other beta-lactam agents, ie, penicillins, beta-lactam/beta-lactamase inhibitor combinations, cephems (with the exception of the cephalosporins with anti-MRSA activity), and carbapenems, may appear active in vitro, but are not effective clinically.  --CLSI, Vol.32 No.3, January 2012, pg 70.    TETRACYCLINE Value in next row Sensitive      >=4 RESISTANTWARNING: For oxacillin-resistant S.aureus and coagulase-negative staphylococci (MRS), other beta-lactam agents, ie, penicillins, beta-lactam/beta-lactamase inhibitor combinations, cephems (with the exception of the cephalosporins with anti-MRSA activity), and carbapenems, may appear active in vitro, but are not effective clinically.  --CLSI, Vol.32 No.3, January 2012, pg 70.    VANCOMYCIN Value in next row Sensitive      >=4 RESISTANTWARNING: For oxacillin-resistant S.aureus and coagulase-negative staphylococci (MRS), other beta-lactam agents, ie, penicillins, beta-lactam/beta-lactamase inhibitor combinations, cephems (with the exception of the cephalosporins with anti-MRSA activity), and carbapenems, may appear active in vitro, but are not effective clinically.  --CLSI, Vol.32 No.3, January 2012, pg 70.    CLINDAMYCIN Value in next row Sensitive      >=4 RESISTANTWARNING: For oxacillin-resistant S.aureus and coagulase-negative staphylococci (MRS), other beta-lactam agents, ie, penicillins, beta-lactam/beta-lactamase inhibitor combinations, cephems (with the exception of the cephalosporins with anti-MRSA activity), and carbapenems, may appear active in vitro, but are not  effective clinically.  --CLSI, Vol.32 No.3, January 2012, pg 70.    LINEZOLID Value in next row Sensitive      SENSITIVE2    CEFAZOLIN Value in next row Resistant      SENSITIVE2    * STAPHYLOCOCCUS EPIDERMIDIS  Blood culture (routine x 2)     Status: None   Collection Time: 05/10/15  6:58 PM  Result Value Ref Range Status   Specimen Description BLOOD LEFT WRIST  Final   Special Requests BOTTLES DRAWN AEROBIC AND ANAEROBIC 2ML  Final   Culture  Setup  Time   Final    GRAM POSITIVE COCCI ANAEROBIC BOTTLE ONLY CRITICAL RESULT CALLED TO, READ BACK BY AND VERIFIED WITH: DOLL FERGUSON ON 05/11/15 AT 459 PM BY TB. CONFIRMED BY TB/SDR    Culture   Final    STAPHYLOCOCCUS EPIDERMIDIS WARNING: For oxacillin-resistant S.aureus and coagulase-negative staphylococci (MRS), other beta-lactam agents, ie, penicillins, beta-lactam/beta-lactamase inhibitor combinations, cephems (with the exception of the cephalosporins with anti-MRSA  activity), and carbapenems, may appear active in vitro, but are not effective clinically.  --CLSI, Vol.32 No.3, January 2012, pg 70.    Report Status 05/14/2015 FINAL  Final   Organism ID, Bacteria STAPHYLOCOCCUS EPIDERMIDIS  Final      Susceptibility   Staphylococcus epidermidis - MIC*    CIPROFLOXACIN >=8 RESISTANT Resistant     ERYTHROMYCIN >=8 RESISTANT Resistant     GENTAMICIN >=16 RESISTANT Resistant     OXACILLIN Value in next row Resistant      >=4 RESISTANTWARNING: For oxacillin-resistant S.aureus and coagulase-negative staphylococci (MRS), other beta-lactam agents, ie, penicillins, beta-lactam/beta-lactamase inhibitor combinations, cephems (with the exception of the cephalosporins with anti-MRSA activity), and carbapenems, may appear active in vitro, but are not effective clinically.  --CLSI, Vol.32 No.3, January 2012, pg 70.    TETRACYCLINE Value in next row Resistant      >=4 RESISTANTWARNING: For oxacillin-resistant S.aureus and coagulase-negative staphylococci  (MRS), other beta-lactam agents, ie, penicillins, beta-lactam/beta-lactamase inhibitor combinations, cephems (with the exception of the cephalosporins with anti-MRSA activity), and carbapenems, may appear active in vitro, but are not effective clinically.  --CLSI, Vol.32 No.3, January 2012, pg 70.    VANCOMYCIN Value in next row Sensitive      >=4 RESISTANTWARNING: For oxacillin-resistant S.aureus and coagulase-negative staphylococci (MRS), other beta-lactam agents, ie, penicillins, beta-lactam/beta-lactamase inhibitor combinations, cephems (with the exception of the cephalosporins with anti-MRSA activity), and carbapenems, may appear active in vitro, but are not effective clinically.  --CLSI, Vol.32 No.3, January 2012, pg 70.    CLINDAMYCIN Value in next row Resistant      >=4 RESISTANTWARNING: For oxacillin-resistant S.aureus and coagulase-negative staphylococci (MRS), other beta-lactam agents, ie, penicillins, beta-lactam/beta-lactamase inhibitor combinations, cephems (with the exception of the cephalosporins with anti-MRSA activity), and carbapenems, may appear active in vitro, but are not effective clinically.  --CLSI, Vol.32 No.3, January 2012, pg 70.    LINEZOLID Value in next row Sensitive      SENSITIVE1    CEFAZOLIN Value in next row Resistant      SENSITIVE1    LEVOFLOXACIN Value in next row Resistant      RESISTANT>=8    * STAPHYLOCOCCUS EPIDERMIDIS  C difficile quick scan w PCR reflex     Status: Abnormal   Collection Time: 05/11/15  6:35 PM  Result Value Ref Range Status   C Diff antigen POSITIVE (A) NEGATIVE Final   C Diff toxin NEGATIVE NEGATIVE Final   C Diff interpretation   Final    Negative for toxigenic C. difficile. Toxin gene and active toxin production not detected. May be a nontoxigenic strain of C. difficile bacteria present, lacking the ability to produce toxin.  Clostridium Difficile by PCR     Status: None   Collection Time: 05/11/15  6:35 PM  Result Value Ref Range  Status   Toxigenic C Difficile by pcr NEGATIVE NEGATIVE Final  Culture, blood (routine x 2)     Status: None   Collection Time: 05/12/15  9:35 AM  Result Value Ref Range Status   Specimen Description BLOOD LEFT WRIST  Final  Special Requests BOTTLES DRAWN AEROBIC AND ANAEROBIC  3 CC  Final   Culture NO GROWTH 5 DAYS  Final   Report Status 05/17/2015 FINAL  Final  Culture, blood (routine x 2)     Status: None   Collection Time: 05/12/15  9:35 AM  Result Value Ref Range Status   Specimen Description BLOOD LEFT ASSIST CONTROL  Final   Special Requests BOTTLES DRAWN AEROBIC AND ANAEROBIC  5 CC  Final   Culture NO GROWTH 5 DAYS  Final   Report Status 05/17/2015 FINAL  Final    Coagulation Studies: No results for input(s): LABPROT, INR in the last 72 hours.  Urinalysis: No results for input(s): COLORURINE, LABSPEC, PHURINE, GLUCOSEU, HGBUR, BILIRUBINUR, KETONESUR, PROTEINUR, UROBILINOGEN, NITRITE, LEUKOCYTESUR in the last 72 hours.  Invalid input(s): APPERANCEUR    Imaging: No results found.   Medications:     . antiseptic oral rinse  7 mL Mouth Rinse BID  . exemestane  25 mg Oral Daily  . feeding supplement (ENSURE ENLIVE)  237 mL Oral BID WC  . insulin aspart  0-5 Units Subcutaneous QHS  . insulin aspart  0-9 Units Subcutaneous TID WC  . megestrol  80 mg Oral BID  . metroNIDAZOLE  500 mg Oral 3 times per day  . pantoprazole  40 mg Oral Daily  . rOPINIRole  1 mg Oral QHS  . simvastatin  40 mg Oral QHS   albuterol, diazepam, liver oil-zinc oxide, morphine injection, ondansetron **OR** ondansetron (ZOFRAN) IV, oxyCODONE, sodium chloride  Assessment/ Plan:  Ms. Becky Roberts is a 81 y.o. white female with diabetes mellitus type 2, nonalcoholic liver cirrhosis, ascites, chronic kidney disease stage III, was admitted on 05/10/2015 with nausea, diarrhea. She has been diagnosed with SBP. Her blood culture is growing coag-negative staph which is resistant to multiple  antibiotics . C. difficile antigen is positive but C. difficile toxin is negative   1.Acute renal failure on chronic kidney disease stage III/IV. Acute renal failure most consistent with ATN. Baseline creatinine from admission of 1.69, with eGFR of 27.  - Cr continues to trend down slowly, currently Cr is 2.1.  As before will need outpt follow up for this issue with Dr. Candiss Norse.  2. Spontaneous bacterial peritonitis K65.2. ID was following. Treated with ceftriaxone, abdomen non tender at present.  3. Generalized edema: discussed with Dr. Verdell Carmine, pt to be restarted on low dose lasix 48m po daily upon discharge.     LOS: 9 Jahziel Sinn 8/18/20162:10 PM

## 2015-05-19 NOTE — Care Management Important Message (Signed)
Important Message  Patient Details  Name: MALAINA MORTELLARO MRN: [id number] Date of Birth: 07-16-1932   Medicare Important Message Given:  Yes-third notification given    Katrina Stack, RN 05/19/2015, 8:15 AM

## 2015-05-19 NOTE — Clinical Social Work Note (Signed)
Patient cleared for discharge and Becky Roberts has been received. [facility] can take today and has received the discharge summary. Patient to transport via EMS due to mobility and isolation precautions for current cdiff. Shela Leff MSW,LCSW [phone]

## 2015-05-25 LAB — GLUCOSE, CAPILLARY
GLUCOSE-CAPILLARY: 106 mg/dL — AB (ref 65–99)
Glucose-Capillary: 126 mg/dL — ABNORMAL HIGH (ref 65–99)

## 2015-05-31 ENCOUNTER — Inpatient Hospital Stay
Admission: EM | Admit: 2015-05-31 | Discharge: 2015-06-02 | DRG: 871 | Disposition: E | Payer: Medicare HMO | Attending: Internal Medicine | Admitting: Internal Medicine

## 2015-05-31 ENCOUNTER — Inpatient Hospital Stay: Payer: Medicare HMO

## 2015-05-31 ENCOUNTER — Emergency Department: Payer: Medicare HMO

## 2015-05-31 ENCOUNTER — Other Ambulatory Visit: Payer: Self-pay | Admitting: Internal Medicine

## 2015-05-31 DIAGNOSIS — K7290 Hepatic failure, unspecified without coma: Secondary | ICD-10-CM | POA: Diagnosis present

## 2015-05-31 DIAGNOSIS — R579 Shock, unspecified: Secondary | ICD-10-CM | POA: Diagnosis not present

## 2015-05-31 DIAGNOSIS — K7460 Unspecified cirrhosis of liver: Secondary | ICD-10-CM | POA: Diagnosis present

## 2015-05-31 DIAGNOSIS — Z833 Family history of diabetes mellitus: Secondary | ICD-10-CM

## 2015-05-31 DIAGNOSIS — E1122 Type 2 diabetes mellitus with diabetic chronic kidney disease: Secondary | ICD-10-CM | POA: Diagnosis present

## 2015-05-31 DIAGNOSIS — I959 Hypotension, unspecified: Secondary | ICD-10-CM | POA: Diagnosis present

## 2015-05-31 DIAGNOSIS — K652 Spontaneous bacterial peritonitis: Secondary | ICD-10-CM | POA: Diagnosis present

## 2015-05-31 DIAGNOSIS — F419 Anxiety disorder, unspecified: Secondary | ICD-10-CM | POA: Diagnosis not present

## 2015-05-31 DIAGNOSIS — Z882 Allergy status to sulfonamides status: Secondary | ICD-10-CM

## 2015-05-31 DIAGNOSIS — N390 Urinary tract infection, site not specified: Secondary | ICD-10-CM | POA: Diagnosis present

## 2015-05-31 DIAGNOSIS — E669 Obesity, unspecified: Secondary | ICD-10-CM | POA: Diagnosis present

## 2015-05-31 DIAGNOSIS — Z888 Allergy status to other drugs, medicaments and biological substances status: Secondary | ICD-10-CM

## 2015-05-31 DIAGNOSIS — A419 Sepsis, unspecified organism: Secondary | ICD-10-CM | POA: Diagnosis present

## 2015-05-31 DIAGNOSIS — Z794 Long term (current) use of insulin: Secondary | ICD-10-CM | POA: Diagnosis not present

## 2015-05-31 DIAGNOSIS — Z8249 Family history of ischemic heart disease and other diseases of the circulatory system: Secondary | ICD-10-CM

## 2015-05-31 DIAGNOSIS — Z79891 Long term (current) use of opiate analgesic: Secondary | ICD-10-CM | POA: Diagnosis not present

## 2015-05-31 DIAGNOSIS — J449 Chronic obstructive pulmonary disease, unspecified: Secondary | ICD-10-CM | POA: Diagnosis present

## 2015-05-31 DIAGNOSIS — Z66 Do not resuscitate: Secondary | ICD-10-CM | POA: Diagnosis present

## 2015-05-31 DIAGNOSIS — F329 Major depressive disorder, single episode, unspecified: Secondary | ICD-10-CM | POA: Diagnosis present

## 2015-05-31 DIAGNOSIS — I129 Hypertensive chronic kidney disease with stage 1 through stage 4 chronic kidney disease, or unspecified chronic kidney disease: Secondary | ICD-10-CM | POA: Diagnosis present

## 2015-05-31 DIAGNOSIS — G2581 Restless legs syndrome: Secondary | ICD-10-CM | POA: Diagnosis present

## 2015-05-31 DIAGNOSIS — N170 Acute kidney failure with tubular necrosis: Secondary | ICD-10-CM | POA: Diagnosis present

## 2015-05-31 DIAGNOSIS — Z515 Encounter for palliative care: Secondary | ICD-10-CM

## 2015-05-31 DIAGNOSIS — Z8261 Family history of arthritis: Secondary | ICD-10-CM

## 2015-05-31 DIAGNOSIS — R1310 Dysphagia, unspecified: Secondary | ICD-10-CM | POA: Diagnosis present

## 2015-05-31 DIAGNOSIS — Z79818 Long term (current) use of other agents affecting estrogen receptors and estrogen levels: Secondary | ICD-10-CM

## 2015-05-31 DIAGNOSIS — N179 Acute kidney failure, unspecified: Secondary | ICD-10-CM

## 2015-05-31 DIAGNOSIS — N183 Chronic kidney disease, stage 3 (moderate): Secondary | ICD-10-CM | POA: Diagnosis present

## 2015-05-31 DIAGNOSIS — R0600 Dyspnea, unspecified: Secondary | ICD-10-CM | POA: Diagnosis not present

## 2015-05-31 DIAGNOSIS — R6521 Severe sepsis with septic shock: Secondary | ICD-10-CM | POA: Diagnosis present

## 2015-05-31 DIAGNOSIS — Z853 Personal history of malignant neoplasm of breast: Secondary | ICD-10-CM

## 2015-05-31 DIAGNOSIS — R188 Other ascites: Secondary | ICD-10-CM | POA: Diagnosis present

## 2015-05-31 DIAGNOSIS — G894 Chronic pain syndrome: Secondary | ICD-10-CM | POA: Diagnosis present

## 2015-05-31 DIAGNOSIS — Z79899 Other long term (current) drug therapy: Secondary | ICD-10-CM

## 2015-05-31 DIAGNOSIS — E860 Dehydration: Secondary | ICD-10-CM | POA: Diagnosis present

## 2015-05-31 DIAGNOSIS — D684 Acquired coagulation factor deficiency: Secondary | ICD-10-CM | POA: Diagnosis present

## 2015-05-31 DIAGNOSIS — D649 Anemia, unspecified: Secondary | ICD-10-CM | POA: Diagnosis present

## 2015-05-31 DIAGNOSIS — Z6839 Body mass index (BMI) 39.0-39.9, adult: Secondary | ICD-10-CM | POA: Diagnosis not present

## 2015-05-31 DIAGNOSIS — Z9111 Patient's noncompliance with dietary regimen: Secondary | ICD-10-CM | POA: Diagnosis present

## 2015-05-31 DIAGNOSIS — Z87891 Personal history of nicotine dependence: Secondary | ICD-10-CM

## 2015-05-31 DIAGNOSIS — E785 Hyperlipidemia, unspecified: Secondary | ICD-10-CM | POA: Diagnosis present

## 2015-05-31 DIAGNOSIS — E119 Type 2 diabetes mellitus without complications: Secondary | ICD-10-CM | POA: Diagnosis present

## 2015-05-31 DIAGNOSIS — Z789 Other specified health status: Secondary | ICD-10-CM

## 2015-05-31 DIAGNOSIS — J45909 Unspecified asthma, uncomplicated: Secondary | ICD-10-CM | POA: Diagnosis present

## 2015-05-31 DIAGNOSIS — E538 Deficiency of other specified B group vitamins: Secondary | ICD-10-CM | POA: Diagnosis present

## 2015-05-31 DIAGNOSIS — J9811 Atelectasis: Secondary | ICD-10-CM | POA: Diagnosis present

## 2015-05-31 DIAGNOSIS — I5032 Chronic diastolic (congestive) heart failure: Secondary | ICD-10-CM | POA: Diagnosis present

## 2015-05-31 DIAGNOSIS — J9690 Respiratory failure, unspecified, unspecified whether with hypoxia or hypercapnia: Secondary | ICD-10-CM

## 2015-05-31 DIAGNOSIS — G9341 Metabolic encephalopathy: Secondary | ICD-10-CM | POA: Diagnosis present

## 2015-05-31 DIAGNOSIS — R401 Stupor: Secondary | ICD-10-CM | POA: Diagnosis present

## 2015-05-31 DIAGNOSIS — M1990 Unspecified osteoarthritis, unspecified site: Secondary | ICD-10-CM | POA: Diagnosis present

## 2015-05-31 LAB — CBC
HCT: 34.5 % — ABNORMAL LOW (ref 35.0–47.0)
Hemoglobin: 11.2 g/dL — ABNORMAL LOW (ref 12.0–16.0)
MCH: 31.6 pg (ref 26.0–34.0)
MCHC: 32.4 g/dL (ref 32.0–36.0)
MCV: 97.6 fL (ref 80.0–100.0)
PLATELETS: 252 10*3/uL (ref 150–440)
RBC: 3.54 MIL/uL — ABNORMAL LOW (ref 3.80–5.20)
RDW: 18.6 % — AB (ref 11.5–14.5)
WBC: 10.7 10*3/uL (ref 3.6–11.0)

## 2015-05-31 LAB — C DIFFICILE QUICK SCREEN W PCR REFLEX
C DIFFICILE (CDIFF) INTERP: NEGATIVE
C DIFFICILE (CDIFF) TOXIN: NEGATIVE
C DIFFICLE (CDIFF) ANTIGEN: NEGATIVE

## 2015-05-31 LAB — COMPREHENSIVE METABOLIC PANEL
ALK PHOS: 123 U/L (ref 38–126)
ALT: 15 U/L (ref 14–54)
ANION GAP: 14 (ref 5–15)
AST: 96 U/L — ABNORMAL HIGH (ref 15–41)
Albumin: 2.2 g/dL — ABNORMAL LOW (ref 3.5–5.0)
BUN: 62 mg/dL — ABNORMAL HIGH (ref 6–20)
CALCIUM: 7.7 mg/dL — AB (ref 8.9–10.3)
CO2: 19 mmol/L — ABNORMAL LOW (ref 22–32)
CREATININE: 6.03 mg/dL — AB (ref 0.44–1.00)
Chloride: 105 mmol/L (ref 101–111)
GFR, EST AFRICAN AMERICAN: 7 mL/min — AB (ref >60)
GFR, EST NON AFRICAN AMERICAN: 6 mL/min — AB (ref >60)
Glucose, Bld: 104 mg/dL — ABNORMAL HIGH (ref 65–99)
Potassium: 4.7 mmol/L (ref 3.5–5.1)
Sodium: 138 mmol/L (ref 135–145)
Total Bilirubin: 4.3 mg/dL — ABNORMAL HIGH (ref 0.3–1.2)
Total Protein: 6.3 g/dL — ABNORMAL LOW (ref 6.5–8.1)

## 2015-05-31 LAB — LACTIC ACID, PLASMA
Lactic Acid, Venous: 4.2 mmol/L (ref 0.5–2.0)
Lactic Acid, Venous: 4.3 mmol/L (ref 0.5–2.0)

## 2015-05-31 LAB — URINALYSIS COMPLETE WITH MICROSCOPIC (ARMC ONLY): Specific Gravity, Urine: 1.023 (ref 1.005–1.030)

## 2015-05-31 LAB — AMMONIA: Ammonia: 32 umol/L (ref 9–35)

## 2015-05-31 LAB — TROPONIN I
TROPONIN I: 0.1 ng/mL — AB (ref <0.031)
Troponin I: 0.12 ng/mL — ABNORMAL HIGH (ref <0.031)
Troponin I: 0.11 ng/mL — ABNORMAL HIGH (ref <0.031)

## 2015-05-31 LAB — CK: CK TOTAL: 302 U/L — AB (ref 38–234)

## 2015-05-31 LAB — BRAIN NATRIURETIC PEPTIDE: B Natriuretic Peptide: 254 pg/mL — ABNORMAL HIGH (ref 0.0–100.0)

## 2015-05-31 MED ORDER — LACTULOSE 10 GM/15ML PO SOLN: 40.0000 g | Freq: Every day | ORAL | Status: DC | PRN

## 2015-05-31 MED ORDER — VANCOMYCIN HCL IN DEXTROSE 1-5 GM/200ML-% IV SOLN
1000.0000 mg | Freq: Once | INTRAVENOUS | Status: AC
  Administered 2015-05-31: 1000 mg via INTRAVENOUS
  Filled 2015-05-31: qty 200

## 2015-05-31 MED ORDER — METRONIDAZOLE 500 MG PO TABS: 500.0000 mg | ORAL_TABLET | Freq: Three times a day (TID) | ORAL | Status: DC

## 2015-05-31 MED ORDER — ALBUTEROL SULFATE HFA 108 (90 BASE) MCG/ACT IN AERS: 2.0000 | INHALATION_SPRAY | Freq: Four times a day (QID) | RESPIRATORY_TRACT | Status: DC | PRN

## 2015-05-31 MED ORDER — NYSTATIN 100000 UNIT/ML MT SUSP: 5.0000 mL | Freq: Four times a day (QID) | OROMUCOSAL | Status: DC

## 2015-05-31 MED ORDER — PIPERACILLIN-TAZOBACTAM 3.375 G IVPB
3.3750 g | Freq: Two times a day (BID) | INTRAVENOUS | Status: DC
  Administered 2015-05-31 – 2015-06-01 (×3): 3.375 g via INTRAVENOUS
  Filled 2015-05-31 (×5): qty 50

## 2015-05-31 MED ORDER — SODIUM CHLORIDE 0.9 % IV BOLUS (SEPSIS)
2000.0000 mL | Freq: Once | INTRAVENOUS | Status: AC
  Administered 2015-05-31: 1000 mL via INTRAVENOUS

## 2015-05-31 MED ORDER — SODIUM CHLORIDE 0.9 % IV BOLUS (SEPSIS)
1000.0000 mL | Freq: Once | INTRAVENOUS | Status: AC
  Administered 2015-05-31: 1000 mL via INTRAVENOUS

## 2015-05-31 MED ORDER — ALBUTEROL SULFATE (2.5 MG/3ML) 0.083% IN NEBU: 2.5000 mg | INHALATION_SOLUTION | Freq: Four times a day (QID) | RESPIRATORY_TRACT | Status: DC | PRN

## 2015-05-31 MED ORDER — METOCLOPRAMIDE HCL 5 MG PO TABS: 10.0000 mg | ORAL_TABLET | Freq: Three times a day (TID) | ORAL | Status: DC

## 2015-05-31 MED ORDER — RIFAXIMIN 550 MG PO TABS: 550.0000 mg | ORAL_TABLET | Freq: Two times a day (BID) | ORAL | Status: DC

## 2015-05-31 MED ORDER — HEPARIN SODIUM (PORCINE) 5000 UNIT/ML IJ SOLN
5000.0000 [IU] | Freq: Three times a day (TID) | INTRAMUSCULAR | Status: DC
  Administered 2015-05-31 – 2015-06-01 (×3): 5000 [IU] via SUBCUTANEOUS
  Filled 2015-05-31 (×3): qty 1

## 2015-05-31 MED ORDER — MORPHINE SULFATE (PF) 2 MG/ML IV SOLN
1.0000 mg | INTRAVENOUS | Status: DC | PRN
  Administered 2015-06-01 (×2): 1 mg via INTRAVENOUS
  Filled 2015-05-31 (×3): qty 1

## 2015-05-31 MED ORDER — CETYLPYRIDINIUM CHLORIDE 0.05 % MT LIQD
7.0000 mL | Freq: Two times a day (BID) | OROMUCOSAL | Status: DC
  Administered 2015-05-31 – 2015-06-01 (×2): 7 mL via OROMUCOSAL

## 2015-05-31 MED ORDER — INFLUENZA VAC SPLIT QUAD 0.5 ML IM SUSY: 0.5000 mL | PREFILLED_SYRINGE | INTRAMUSCULAR | Status: DC

## 2015-05-31 MED ORDER — NALOXONE HCL 1 MG/ML IJ SOLN
0.4000 mg | Freq: Once | INTRAMUSCULAR | Status: AC
  Administered 2015-05-31: 0.4 mg via INTRAVENOUS
  Filled 2015-05-31: qty 2

## 2015-05-31 MED ORDER — ATORVASTATIN CALCIUM 20 MG PO TABS
20.0000 mg | ORAL_TABLET | Freq: Every day | ORAL | Status: DC
Start: 2015-05-31 — End: 2015-06-01

## 2015-05-31 MED ORDER — SODIUM CHLORIDE 0.9 % IV SOLN
INTRAVENOUS | Status: DC
  Administered 2015-05-31 (×2): via INTRAVENOUS

## 2015-05-31 NOTE — ED Notes (Signed)
Called tech to take to floor. Will be transported when tech is back from transporting another patient.

## 2015-05-31 NOTE — ED Notes (Signed)
Pt to room 17 via EMS from [facility],  Per EMS report when staff at [facility] went to patients room to administer her medications they found patient with decreased mental status.  She is alert an responsive to name only.

## 2015-05-31 NOTE — ED Provider Notes (Addendum)
[facility] Emergency Department Provider Note  ____________________________________________  Time seen: 37  I have reviewed the triage vital signs and the nursing notes.   HISTORY  Chief Complaint Altered Mental Status     HPI Becky Roberts is a 81 y.o. female who has recently had notable health problems including a diagnosis of cirrhosis, ascites, and Clostridium difficile  The family, most notably the son, tells me that 2 days ago she was alert and communicative, but yesterday when he went to visit her in the care facility, he found her moaning, nonverbal, with her head to the left. He spoke with the nursing staff at the care facility about this. He said he couldn't sleep last night and called facility a few times. The plan was for her to be sent to the hospital this morning, but the facility chose to send her earlier due to this altered mental status.    Past Medical History  Diagnosis Date  . Blood transfusion reaction   . Diabetes mellitus   . Depression   . Hypertension   . Anemia 2012    hgb 5.5, admitted,  workup multifactorial,  b12 deficiency  . Arthritis   . Asthma   . Portal hypertension February 2014    CT suggested a small nodular liver, multiple varices adjacent to the spleen and inferior mediastinum and paraesophageal region. No ascites.  . Breast cancer July 2,2013    T1c,N0,M0. ER + PR- , HER-2/neu not overexpressing..  . Cirrhosis   . Cholelithiases   . CKD (chronic kidney disease) stage 3, GFR 30-59 ml/min     Patient Active Problem List   Diagnosis Date Noted  . Malnutrition of moderate degree 05/16/2015  . Sepsis 05/10/2015  . SBP (spontaneous bacterial peritonitis) 04/18/2015  . Abdominal pain 04/14/2015  . UTI (urinary tract infection) 04/14/2015  . Enteritis 04/14/2015  . Ascites 04/14/2015  . Cirrhosis of liver not due to alcohol 02/14/2014  . Chronic pain syndrome 01/14/2014  . Diarrhea 01/14/2014  .  Urinary frequency 01/14/2014  . Gingivitis, chronic 01/13/2014  . Noncompliance of patient with dietary regimen 09/07/2013  . History of breast cancer 08/17/2013  . Dental infection 05/02/2013  . Restless legs syndrome 01/21/2013  . Chronic diastolic congestive heart failure 12/03/2012  . Chronic cholecystitis with calculus 11/05/2012  . Obesity (BMI 30-39.9) 04/17/2012  . Type II diabetes mellitus   . Depression   . Hypertension   . Arthritis   . COPD (chronic obstructive pulmonary disease)   . Breast cancer 03/31/2012  . Generalized muscle weakness 03/06/2012    Past Surgical History  Procedure Laterality Date  . Colonoscopy  2012  . Breast biopsy Right July 2013    [name]: wide excision     Current Outpatient Rx  Name  Route  Sig  Dispense  Refill  . albuterol (PROVENTIL HFA;VENTOLIN HFA) 108 (90 BASE) MCG/ACT inhaler   Inhalation   Inhale 2 puffs into the lungs every 6 (six) hours as needed for wheezing.   1 Inhaler   11   . ALPRAZolam (XANAX) 0.25 MG tablet   Oral   Take 1 tablet (0.25 mg total) by mouth at bedtime as needed for anxiety.   15 tablet   0   . atorvastatin (LIPITOR) 20 MG tablet   Oral   Take 20 mg by mouth daily.         . Calcium Carbonate-Vitamin D (CALTRATE 600+D PO)   Oral   Take 2 tablets  by mouth daily.         . chlorhexidine (PERIDEX) 0.12 % solution   Mouth/Throat   Use as directed 15 mLs in the mouth or throat 2 (two) times daily.   120 mL   2   . cyanocobalamin (,VITAMIN B-12,) 1000 MCG/ML injection   Intramuscular   Inject 1 mL (1,000 mcg total) into the muscle every 30 (thirty) days. Patient taking differently: Inject 1,000 mcg into the muscle every 30 (thirty) days. Pt uses on the 28th of every month.   10 mL   12   . diazepam (VALIUM) 2 MG tablet   Oral   Take 2 mg by mouth 3 (three) times daily as needed for anxiety.         Marland Kitchen exemestane (AROMASIN) 25 MG tablet   Oral   Take 25 mg by mouth daily.           . furosemide (LASIX) 20 MG tablet   Oral   Take 1 tablet (20 mg total) by mouth daily.   30 tablet   0   . lactulose (CHRONULAC) 10 GM/15ML solution   Oral   Take 40 g by mouth daily as needed for mild constipation.         . megestrol (MEGACE) 40 MG tablet   Oral   Take 2 tablets (80 mg total) by mouth 2 (two) times daily.         . metoCLOPramide (REGLAN) 10 MG tablet   Oral   Take 10 mg by mouth 3 (three) times daily before meals.          . metoprolol tartrate (LOPRESSOR) 25 MG tablet      TAKE ONE TABLET TWICE DAILY Patient taking differently: Take 25 mg by mouth 2 (two) times daily.    180 tablet   3   . metroNIDAZOLE (FLAGYL) 500 MG tablet   Oral   Take 500 mg by mouth 3 (three) times daily.         Marland Kitchen nystatin (MYCOSTATIN) 100000 UNIT/ML suspension   Mouth/Throat   Use as directed 5 mLs (500,000 Units total) in the mouth or throat 4 (four) times daily.   120 mL   0   . omeprazole (PRILOSEC) 20 MG capsule   Oral   Take 20 mg by mouth daily.         Marland Kitchen oxyCODONE (OXY IR/ROXICODONE) 5 MG immediate release tablet   Oral   Take 1 tablet (5 mg total) by mouth every 8 (eight) hours as needed for moderate pain.   30 tablet   0   . rOPINIRole (REQUIP) 0.5 MG tablet   Oral   Take 1 mg by mouth at bedtime.         . Vitamin D, Ergocalciferol, (DRISDOL) 50000 UNITS CAPS capsule   Oral   Take 50,000 Units by mouth every 7 (seven) days. Pt takes on Monday.         . insulin aspart (NOVOLOG) 100 UNIT/ML injection   Subcutaneous   Inject 0-5 Units into the skin at bedtime.   10 mL   11   . insulin aspart (NOVOLOG) 100 UNIT/ML injection   Subcutaneous   Inject 0-9 Units into the skin 3 (three) times daily with meals.   10 mL   11   . insulin detemir (LEVEMIR) 100 UNIT/ML injection      16 to 20 units daily Patient taking differently: Inject 16-20 Units into the skin 3 (three)  times daily as needed (for high blood sugar). Pt uses as needed per  sliding scale.   15 mL   12   . liver oil-zinc oxide (DESITIN) 40 % ointment   Topical   Apply topically as needed for irritation.   56.7 g   0   . nadolol (CORGARD) 20 MG tablet   Oral   Take 1 tablet (20 mg total) by mouth daily.   30 tablet   0     Allergies Sulfa antibiotics; Tylenol; Vioxx; and Aspirin  Family History  Problem Relation Age of Onset  . Arthritis Mother   . Hypertension Mother   . Diabetes Mother   . Arthritis Father   . Hypertension Father   . Diabetes Father   . Arthritis Other   . Cancer Other     breast/lung    Social History Social History  Substance Use Topics  . Smoking status: Former Research scientist (life sciences)  . Smokeless tobacco: Never Used  . Alcohol Use: No    Review of Systems Unable to obtain a review of systems due to the patient's noncommunicative altered mental status. ____________________________________________   PHYSICAL EXAM:  VITAL SIGNS: ED Triage Vitals  Enc Vitals Group     BP 05/22/2015 0732 94/36 mmHg     Pulse Rate 05/28/2015 0732 73     Resp --      Temp 05/04/2015 0732 96.8 F (36 C)     Temp Source 05/08/2015 0732 Axillary     SpO2 05/25/2015 0732 100 %     Weight 05/27/2015 0732 199 lb 11.2 oz (90.583 kg)     Height --      Head Cir --      Peak Flow --      Pain Score --      Pain Loc --      Pain Edu? --      Excl. in [place]? --     Constitutional: Ill-appearing, impaired, 81 year old female. Her head is turned to the left. She responds to noxious stimuli but is otherwise noncommunicative and not interactive.Marland Kitchen ENT   Head: Normocephalic and atraumatic.   Nose: No congestion/rhinnorhea.   Mouth/Throat: Mucous membranes are slightly dry with sticky secretions. She has very poor dentition.      Eyes: Pupils are small and not very responsive. Cardiovascular: Normal rate, regular rhythm, no murmur noted Respiratory:  Patient with slow respiratory effort but no acute respiratory distress..    Breath sounds are clear and  equal bilaterally.  Gastrointestinal: Abdomen appears large and a little bit distended. She appears to have some induration in the abdominal wall without erythema. This is diffuse and may simply be underlying adipose tissue. I am unable to determine if she has ascites present given the large size of her abdomen.  Musculoskeletal: No deformity noted. 1-2+ being edema up to both legs. Neurologic:  Nonverbal, fully responsive to noxious stimuli. Skin:  Skin is warm, dry. No rash noted. Psychiatric: Unresponsive except to noxious stimuli.  ____________________________________________    LABS (pertinent positives/negatives)  Labs Reviewed  COMPREHENSIVE METABOLIC PANEL - Abnormal; Notable for the following:    CO2 19 (*)    Glucose, Bld 104 (*)    BUN 62 (*)    Creatinine, Ser 6.03 (*)    Calcium 7.7 (*)    Total Protein 6.3 (*)    Albumin 2.2 (*)    AST 96 (*)    Total Bilirubin 4.3 (*)    GFR calc non Af  Amer 6 (*)    GFR calc Af Amer 7 (*)    All other components within normal limits  CBC - Abnormal; Notable for the following:    RBC 3.54 (*)    Hemoglobin 11.2 (*)    HCT 34.5 (*)    RDW 18.6 (*)    All other components within normal limits  LACTIC ACID, PLASMA - Abnormal; Notable for the following:    Lactic Acid, Venous 4.2 (*)    All other components within normal limits  CULTURE, BLOOD (ROUTINE X 2)  CULTURE, BLOOD (ROUTINE X 2)  URINE CULTURE  AMMONIA  LACTIC ACID, PLASMA  TROPONIN I  BRAIN NATRIURETIC PEPTIDE  URINALYSIS COMPLETEWITH MICROSCOPIC ([facility] ONLY)     ____________________________________________   EKG  ED ECG REPORT I, Windel Keziah W, the attending physician, personally viewed and interpreted this ECG.   Date: 05/28/2015  EKG Time:  10:24 AM  Rate: 80  Rhythm: Normal sinus rhythm  Axis: left axis deviation at -34  Intervals: Normal  ST&T Change: None noted   ____________________________________________    RADIOLOGY  CT scan  head: IMPRESSION: Unremarkable head CT for age.   Chest x-ray:  IMPRESSION: Cardiac enlargement  Left lower lobe consolidation which may represent atelectasis or pneumonia.   ____________________________________________   PROCEDURES  CRITICAL CARE Performed by: Ahmed Prima   Total critical care time: 40 minutes, due to the critical nature of this sick patient with hypotension and altered mental status. This included a discussion to obtain history as well as about CODE STATUS with the family and discussion with other physicians will continue to care for the patient.  Critical care time was exclusive of separately billable procedures and treating other patients.  Critical care was necessary to treat or prevent imminent or life-threatening deterioration.  Critical care was time spent personally by me on the following activities: development of treatment plan with patient and/or surrogate as well as nursing, discussions with consultants, evaluation of patient's response to treatment, examination of patient, obtaining history from patient or surrogate, ordering and performing treatments and interventions, ordering and review of laboratory studies, ordering and review of radiographic studies, pulse oximetry and re-evaluation of patient's condition.   ____________________________________________   INITIAL IMPRESSION / ASSESSMENT AND PLAN / ED COURSE  Pertinent labs & imaging results that were available during my care of the patient were reviewed by me and considered in my medical decision making (see chart for details).  Ill-appearing 81 year old female. She is likely septic, although she is on oxycodone and there is a chance this is induced from a buildup of oxycodone area she does have liver problems which would decrease her clearance of any such medication.  On arrival, she was somewhat hypotensive. She is being treated with IV fluids currently.  I have discussed the  patient's status with her son and husband. The patient does have a DO NOT RESUSCITATE form that appears to be appropriately signed, however the son and husband report that that is in air and that they want her to be full code. We discussed this further and I To them how the patient will receive full medical care but we would fall short of doing compressions and placing on a ventilator if she wanted to cardiac arrest. They agreed with this and reported they did not want her to be on life support. Currently, the family appears to have not made a firm decision about the CODE STATUS for this patient. They will speak among themselves.  I  will treat the patient with Narcan as well as vancomycin. I am still concerned about sepsis. With her recent diagnosis of C. difficile, that could be the cause. Lab results and imaging is pending.  ----------------------------------------- 10:03 AM on 05/10/2015 -----------------------------------------  Narcan, a total of 0.4 mg, had no change for this patient.  Blood tests show BUN and creatinine significantly elevated with acute renal failure. Her potassium is a 4.7. Blood count is reasonable with white blood cell count 10.7 and hemoglobin 11.2. Ammonia is normal at 32. Lactic acid is elevated at 4.2.  This patient is clearly hypoperfusing. We will continue aggressive fluid resuscitation.  We have discussed the case with the hospitalist service, Dr. Posey Pronto, for admission to the hospital.  ____________________________________________   FINAL CLINICAL IMPRESSION(S) / ED DIAGNOSES  Final diagnoses:  Stupor  Sepsis, due to unspecified organism  Acute renal failure, unspecified acute renal failure type      Ahmed Prima, MD 05/21/2015 [place], MD 05/03/2015 1049

## 2015-05-31 NOTE — ED Notes (Signed)
Attempting IV access,   Unable to use right arm - had tumor removed and lymph nodes removed

## 2015-05-31 NOTE — ED Notes (Signed)
Dr Joni Fears aware of patients arrival, her mental status and her vitals including low BP

## 2015-05-31 NOTE — Progress Notes (Signed)
I have seen and examined Ms Rini and agree with Sharyn Lull Johnson's a/p.   Recs: - diagnositic para to confirm if this definitely SBP - will need albumin on day 1 and day 3 if this is SBP. - titrate lactulose to 3 stool per day - start xifaxin 550 BID for H.E

## 2015-05-31 NOTE — Consult Note (Signed)
ANTIBIOTIC CONSULT NOTE - INITIAL  Pharmacy Consult for vancomycin/zosyn Indication: rule out sepsis  Allergies  Allergen Reactions  . Sulfa Antibiotics Hives  . Tylenol [Acetaminophen] Other (See Comments)    Has cirrhosis of the liver secondary to Tylenol use.  . Vioxx [Rofecoxib] Hives and Itching  . Aspirin Palpitations    Patient Measurements: Weight: 199 lb 11.2 oz (90.583 kg) Adjusted Body Weight: 66.3kg  Vital Signs: Temp: 96.4 F (35.8 C) (08/30 1050) Temp Source: Rectal (08/30 1050) BP: 102/46 mmHg (08/30 1230) Pulse Rate: 77 (08/30 1230) Intake/Output from previous day:   Intake/Output from this shift: Total I/O In: -  Out: 20 [Urine:20]  Labs:  Recent Labs  05/11/2015 0842  WBC 10.7  HGB 11.2*  PLT 252  CREATININE 6.03*   Estimated Creatinine Clearance: 7.7 mL/min (by C-G formula based on Cr of 6.03). No results for input(s): VANCOTROUGH, VANCOPEAK, VANCORANDOM, GENTTROUGH, GENTPEAK, GENTRANDOM, TOBRATROUGH, TOBRAPEAK, TOBRARND, AMIKACINPEAK, AMIKACINTROU, AMIKACIN in the last 72 hours.   Microbiology: Recent Results (from the past 720 hour(s))  Body fluid culture     Status: None   Collection Time: 05/10/15  6:30 PM  Result Value Ref Range Status   Specimen Description PERITONEAL  Final   Special Requests Normal  Final   Gram Stain MANY WBC SEEN NO ORGANISMS SEEN   Final   Culture NO GROWTH 4 DAYS  Final   Report Status 05/14/2015 FINAL  Final  Blood culture (routine x 2)     Status: None   Collection Time: 05/10/15  6:58 PM  Result Value Ref Range Status   Specimen Description BLOOD LEFT ANTECUBITAL  Final   Special Requests BOTTLES DRAWN AEROBIC AND ANAEROBIC 5ML  Final   Culture  Setup Time   Final    GRAM POSITIVE COCCI IN BOTH AEROBIC AND ANAEROBIC BOTTLES PREVIOUSLY CALLED TO DOLL FERGUSON AT 5277 05/11/15 BY TB CONFIRMED BY TB    Culture   Final    STAPHYLOCOCCUS EPIDERMIDIS IN BOTH AEROBIC AND ANAEROBIC BOTTLES WARNING: For  oxacillin-resistant S.aureus and coagulase-negative staphylococci (MRS), other beta-lactam agents, ie, penicillins, beta-lactam/beta-lactamase inhibitor combinations, cephems (with the exception of the cephalosporins with anti-MRSA  activity), and carbapenems, may appear active in vitro, but are not effective clinically.  --CLSI, Vol.32 No.3, January 2012, pg 70.    Report Status 05/13/2015 FINAL  Final   Organism ID, Bacteria STAPHYLOCOCCUS EPIDERMIDIS  Final      Susceptibility   Staphylococcus epidermidis - MIC*    CIPROFLOXACIN >=8 RESISTANT Resistant     ERYTHROMYCIN >=8 RESISTANT Resistant     GENTAMICIN <=0.5 SENSITIVE Sensitive     OXACILLIN Value in next row Resistant      >=4 RESISTANTWARNING: For oxacillin-resistant S.aureus and coagulase-negative staphylococci (MRS), other beta-lactam agents, ie, penicillins, beta-lactam/beta-lactamase inhibitor combinations, cephems (with the exception of the cephalosporins with anti-MRSA activity), and carbapenems, may appear active in vitro, but are not effective clinically.  --CLSI, Vol.32 No.3, January 2012, pg 70.    TETRACYCLINE Value in next row Sensitive      >=4 RESISTANTWARNING: For oxacillin-resistant S.aureus and coagulase-negative staphylococci (MRS), other beta-lactam agents, ie, penicillins, beta-lactam/beta-lactamase inhibitor combinations, cephems (with the exception of the cephalosporins with anti-MRSA activity), and carbapenems, may appear active in vitro, but are not effective clinically.  --CLSI, Vol.32 No.3, January 2012, pg 70.    VANCOMYCIN Value in next row Sensitive      >=4 RESISTANTWARNING: For oxacillin-resistant S.aureus and coagulase-negative staphylococci (MRS), other beta-lactam agents, ie, penicillins, beta-lactam/beta-lactamase inhibitor  combinations, cephems (with the exception of the cephalosporins with anti-MRSA activity), and carbapenems, may appear active in vitro, but are not effective clinically.  --CLSI, Vol.32  No.3, January 2012, pg 70.    CLINDAMYCIN Value in next row Sensitive      >=4 RESISTANTWARNING: For oxacillin-resistant S.aureus and coagulase-negative staphylococci (MRS), other beta-lactam agents, ie, penicillins, beta-lactam/beta-lactamase inhibitor combinations, cephems (with the exception of the cephalosporins with anti-MRSA activity), and carbapenems, may appear active in vitro, but are not effective clinically.  --CLSI, Vol.32 No.3, January 2012, pg 70.    LINEZOLID Value in next row Sensitive      SENSITIVE2    CEFAZOLIN Value in next row Resistant      SENSITIVE2    * STAPHYLOCOCCUS EPIDERMIDIS  Blood culture (routine x 2)     Status: None   Collection Time: 05/10/15  6:58 PM  Result Value Ref Range Status   Specimen Description BLOOD LEFT WRIST  Final   Special Requests BOTTLES DRAWN AEROBIC AND ANAEROBIC 2ML  Final   Culture  Setup Time   Final    GRAM POSITIVE COCCI ANAEROBIC BOTTLE ONLY CRITICAL RESULT CALLED TO, READ BACK BY AND VERIFIED WITH: DOLL FERGUSON ON 05/11/15 AT 93 PM BY TB. CONFIRMED BY TB/SDR    Culture   Final    STAPHYLOCOCCUS EPIDERMIDIS WARNING: For oxacillin-resistant S.aureus and coagulase-negative staphylococci (MRS), other beta-lactam agents, ie, penicillins, beta-lactam/beta-lactamase inhibitor combinations, cephems (with the exception of the cephalosporins with anti-MRSA  activity), and carbapenems, may appear active in vitro, but are not effective clinically.  --CLSI, Vol.32 No.3, January 2012, pg 70.    Report Status 05/14/2015 FINAL  Final   Organism ID, Bacteria STAPHYLOCOCCUS EPIDERMIDIS  Final      Susceptibility   Staphylococcus epidermidis - MIC*    CIPROFLOXACIN >=8 RESISTANT Resistant     ERYTHROMYCIN >=8 RESISTANT Resistant     GENTAMICIN >=16 RESISTANT Resistant     OXACILLIN Value in next row Resistant      >=4 RESISTANTWARNING: For oxacillin-resistant S.aureus and coagulase-negative staphylococci (MRS), other beta-lactam agents, ie,  penicillins, beta-lactam/beta-lactamase inhibitor combinations, cephems (with the exception of the cephalosporins with anti-MRSA activity), and carbapenems, may appear active in vitro, but are not effective clinically.  --CLSI, Vol.32 No.3, January 2012, pg 70.    TETRACYCLINE Value in next row Resistant      >=4 RESISTANTWARNING: For oxacillin-resistant S.aureus and coagulase-negative staphylococci (MRS), other beta-lactam agents, ie, penicillins, beta-lactam/beta-lactamase inhibitor combinations, cephems (with the exception of the cephalosporins with anti-MRSA activity), and carbapenems, may appear active in vitro, but are not effective clinically.  --CLSI, Vol.32 No.3, January 2012, pg 70.    VANCOMYCIN Value in next row Sensitive      >=4 RESISTANTWARNING: For oxacillin-resistant S.aureus and coagulase-negative staphylococci (MRS), other beta-lactam agents, ie, penicillins, beta-lactam/beta-lactamase inhibitor combinations, cephems (with the exception of the cephalosporins with anti-MRSA activity), and carbapenems, may appear active in vitro, but are not effective clinically.  --CLSI, Vol.32 No.3, January 2012, pg 70.    CLINDAMYCIN Value in next row Resistant      >=4 RESISTANTWARNING: For oxacillin-resistant S.aureus and coagulase-negative staphylococci (MRS), other beta-lactam agents, ie, penicillins, beta-lactam/beta-lactamase inhibitor combinations, cephems (with the exception of the cephalosporins with anti-MRSA activity), and carbapenems, may appear active in vitro, but are not effective clinically.  --CLSI, Vol.32 No.3, January 2012, pg 70.    LINEZOLID Value in next row Sensitive      SENSITIVE1    CEFAZOLIN Value in next row Resistant  SENSITIVE1    LEVOFLOXACIN Value in next row Resistant      RESISTANT>=8    * STAPHYLOCOCCUS EPIDERMIDIS  C difficile quick scan w PCR reflex     Status: Abnormal   Collection Time: 05/11/15  6:35 PM  Result Value Ref Range Status   C Diff antigen  POSITIVE (A) NEGATIVE Final   C Diff toxin NEGATIVE NEGATIVE Final   C Diff interpretation   Final    Negative for toxigenic C. difficile. Toxin gene and active toxin production not detected. May be a nontoxigenic strain of C. difficile bacteria present, lacking the ability to produce toxin.  Clostridium Difficile by PCR     Status: None   Collection Time: 05/11/15  6:35 PM  Result Value Ref Range Status   Toxigenic C Difficile by pcr NEGATIVE NEGATIVE Final  Culture, blood (routine x 2)     Status: None   Collection Time: 05/12/15  9:35 AM  Result Value Ref Range Status   Specimen Description BLOOD LEFT WRIST  Final   Special Requests BOTTLES DRAWN AEROBIC AND ANAEROBIC  3 CC  Final   Culture NO GROWTH 5 DAYS  Final   Report Status 05/17/2015 FINAL  Final  Culture, blood (routine x 2)     Status: None   Collection Time: 05/12/15  9:35 AM  Result Value Ref Range Status   Specimen Description BLOOD LEFT ASSIST CONTROL  Final   Special Requests BOTTLES DRAWN AEROBIC AND ANAEROBIC  5 CC  Final   Culture NO GROWTH 5 DAYS  Final   Report Status 05/17/2015 FINAL  Final    Medical History: Past Medical History  Diagnosis Date  . Blood transfusion reaction   . Diabetes mellitus   . Depression   . Hypertension   . Anemia 2012    hgb 5.5, admitted,  workup multifactorial,  b12 deficiency  . Arthritis   . Asthma   . Portal hypertension February 2014    CT suggested a small nodular liver, multiple varices adjacent to the spleen and inferior mediastinum and paraesophageal region. No ascites.  . Breast cancer July 2,2013    T1c,N0,M0. ER + PR- , HER-2/neu not overexpressing..  . Cirrhosis   . Cholelithiases   . CKD (chronic kidney disease) stage 3, GFR 30-59 ml/min     Medications:  Scheduled:   Assessment: Pt is a 81 year old female discharged 2 weeks ago being admitted for altered mental status hypotension, hypothermia, some atelecatis of chest x-ray. Vanc and Zosyn ordered.  Patient has worsening renal function. crcl 7.7  Goal of Therapy:  Vancomycin trough level 10-15 mcg/ml  Plan:  Measure antibiotic drug levels at steady state Follow up culture results zosyn 3.375 q 12hr due to renal function EI dosing. Patient receieved 1g of vancomycin in ER. due to renal function, will not give another dose at this time. Will recheck renal function in the AM after hydration is given.   Jimeka Balan D Keyston Ardolino 05/30/2015,12:56 PM

## 2015-05-31 NOTE — Progress Notes (Signed)
MD notified of blood pressure and no urinary output since admission. MD to place orders. Madlyn Frankel, RN

## 2015-05-31 NOTE — Progress Notes (Signed)
Dr. Anselm Jungling notified of critical lab results - lactic acid 4.3 and troponin 0.11 Madlyn Frankel, RN

## 2015-05-31 NOTE — H&P (Addendum)
[facility] NAME: Becky Roberts    MR#:  [id number]  DATE OF BIRTH:  1932/09/18  DATE OF ADMISSION:  05/02/2015  PRIMARY CARE PHYSICIAN: No primary care provider on file.   REQUESTING/REFERRING PHYSICIAN: Thomasene Lot  CHIEF COMPLAINT:   Chief Complaint  Patient presents with  . Altered Mental Status    HISTORY OF PRESENT ILLNESS: Becky Roberts  is a 81 y.o. female with a known history of diabetes, hypertension, liver cirrhosis, recent admission for spontaneous bacterial peritonitis, chronic kidney disease stage III- was discharged to rehabilitation Center 2 weeks ago- and as per son she was participating with physical therapy, eating satisfactorily, and did not had any swelling on her limbs for last 2 weeks. She was at her baseline day before yesterday. Son visits her every day at rehabilitation Center, yesterday morning he noticed she is more drowsy and she could not do much with physical therapy, she was in bed all day, not much arousable- he spoke to the nursing home[facility] as per them it might be due to change in her medications and they continue watching her all day and last night. She still remained very drowsy this morning so finally they called ambulance and she was brought to emergency room. She was barely responsive to painful stimuli and slightly hypotensive in ER, they also gave injection Naloxon without any response. She is found having severe worsening in renal function and dehydration, so given to hospitalist team for admission with sepsis. Patient is drowsy and history obtained from her son who is present in the room.  PAST MEDICAL HISTORY:   Past Medical History  Diagnosis Date  . Blood transfusion reaction   . Diabetes mellitus   . Depression   . Hypertension   . Anemia 2012    hgb 5.5, admitted,  workup multifactorial,  b12 deficiency  . Arthritis   . Asthma   . Portal hypertension February 2014     CT suggested a small nodular liver, multiple varices adjacent to the spleen and inferior mediastinum and paraesophageal region. No ascites.  . Breast cancer July 2,2013    T1c,N0,M0. ER + PR- , HER-2/neu not overexpressing..  . Cirrhosis   . Cholelithiases   . CKD (chronic kidney disease) stage 3, GFR 30-59 ml/min     PAST SURGICAL HISTORY:  Past Surgical History  Procedure Laterality Date  . Colonoscopy  2012  . Breast biopsy Right July 2013    [name]: wide excision     SOCIAL HISTORY:  Social History  Substance Use Topics  . Smoking status: Former Research scientist (life sciences)  . Smokeless tobacco: Never Used  . Alcohol Use: No    FAMILY HISTORY:  Family History  Problem Relation Age of Onset  . Arthritis Mother   . Hypertension Mother   . Diabetes Mother   . Arthritis Father   . Hypertension Father   . Diabetes Father   . Arthritis Other   . Cancer Other     breast/lung    DRUG ALLERGIES:  Allergies  Allergen Reactions  . Sulfa Antibiotics Hives  . Tylenol [Acetaminophen] Other (See Comments)    Has cirrhosis of the liver secondary to Tylenol use.  . Vioxx [Rofecoxib] Hives and Itching  . Aspirin Palpitations    REVIEW OF SYSTEMS:   Not able to get as she is drowsy.  MEDICATIONS AT HOME:  Prior to Admission medications   Medication Sig Start Date End Date Taking? Authorizing Provider  albuterol (PROVENTIL HFA;VENTOLIN HFA) 108 (90 BASE) MCG/ACT inhaler Inhale 2 puffs into the lungs every 6 (six) hours as needed for wheezing. 12/17/12  Yes Crecencio Mc, MD  ALPRAZolam Duanne Moron) 0.25 MG tablet Take 1 tablet (0.25 mg total) by mouth at bedtime as needed for anxiety. 05/19/15  Yes Henreitta Leber, MD  atorvastatin (LIPITOR) 20 MG tablet Take 20 mg by mouth daily.   Yes Historical Provider, MD  Calcium Carbonate-Vitamin D (CALTRATE 600+D PO) Take 2 tablets by mouth daily.   Yes Historical Provider, MD  chlorhexidine (PERIDEX) 0.12 % solution Use as directed 15 mLs in the mouth or  throat 2 (two) times daily. 01/13/14  Yes Crecencio Mc, MD  cyanocobalamin (,VITAMIN B-12,) 1000 MCG/ML injection Inject 1 mL (1,000 mcg total) into the muscle every 30 (thirty) days. Patient taking differently: Inject 1,000 mcg into the muscle every 30 (thirty) days. Pt uses on the 28th of every month. 07/09/12  Yes Crecencio Mc, MD  diazepam (VALIUM) 2 MG tablet Take 2 mg by mouth 3 (three) times daily as needed for anxiety.   Yes Historical Provider, MD  exemestane (AROMASIN) 25 MG tablet Take 25 mg by mouth daily.    Yes Historical Provider, MD  furosemide (LASIX) 20 MG tablet Take 1 tablet (20 mg total) by mouth daily. 04/18/15  Yes Srikar Sudini, MD  lactulose (CHRONULAC) 10 GM/15ML solution Take 40 g by mouth daily as needed for mild constipation.   Yes Historical Provider, MD  megestrol (MEGACE) 40 MG tablet Take 2 tablets (80 mg total) by mouth 2 (two) times daily. 05/19/15  Yes Henreitta Leber, MD  metoCLOPramide (REGLAN) 10 MG tablet Take 10 mg by mouth 3 (three) times daily before meals.    Yes Historical Provider, MD  metoprolol tartrate (LOPRESSOR) 25 MG tablet TAKE ONE TABLET TWICE DAILY Patient taking differently: Take 25 mg by mouth 2 (two) times daily.  04/14/14  Yes Minna Merritts, MD  metroNIDAZOLE (FLAGYL) 500 MG tablet Take 500 mg by mouth 3 (three) times daily.   Yes Historical Provider, MD  nystatin (MYCOSTATIN) 100000 UNIT/ML suspension Use as directed 5 mLs (500,000 Units total) in the mouth or throat 4 (four) times daily. 04/18/15  Yes Srikar Sudini, MD  omeprazole (PRILOSEC) 20 MG capsule Take 20 mg by mouth daily.   Yes Historical Provider, MD  oxyCODONE (OXY IR/ROXICODONE) 5 MG immediate release tablet Take 1 tablet (5 mg total) by mouth every 8 (eight) hours as needed for moderate pain. 05/19/15  Yes Henreitta Leber, MD  rOPINIRole (REQUIP) 0.5 MG tablet Take 1 mg by mouth at bedtime.   Yes Historical Provider, MD  Vitamin D, Ergocalciferol, (DRISDOL) 50000 UNITS CAPS  capsule Take 50,000 Units by mouth every 7 (seven) days. Pt takes on Monday.   Yes Historical Provider, MD  insulin aspart (NOVOLOG) 100 UNIT/ML injection Inject 0-5 Units into the skin at bedtime. 05/19/15   Henreitta Leber, MD  insulin aspart (NOVOLOG) 100 UNIT/ML injection Inject 0-9 Units into the skin 3 (three) times daily with meals. 05/19/15   Henreitta Leber, MD  insulin detemir (LEVEMIR) 100 UNIT/ML injection 16 to 20 units daily Patient taking differently: Inject 16-20 Units into the skin 3 (three) times daily as needed (for high blood sugar). Pt uses as needed per sliding scale. 12/16/12   Crecencio Mc, MD  liver oil-zinc oxide (DESITIN) 40 % ointment Apply topically as needed for irritation. 05/19/15   Belia Heman  Sainani, MD  nadolol (CORGARD) 20 MG tablet Take 1 tablet (20 mg total) by mouth daily. 04/18/15   Srikar Sudini, MD      PHYSICAL EXAMINATION:   VITAL SIGNS: Blood pressure 92/38, pulse 68, temperature 96.4 F (35.8 C), temperature source Rectal, resp. rate 14, weight 90.583 kg (199 lb 11.2 oz), SpO2 100 %.  GENERAL:  81 y.o.-year-old patient lying in the bed, drowsy.  EYES: Pupils equal, round, reactive to light and accommodation. No scleral icterus. Extraocular muscles intact.  HEENT: Head atraumatic, normocephalic. Oropharynx - have poor oral hyegiene.  NECK:  Supple, no jugular venous distention. No thyroid enlargement, no tenderness.  LUNGS: Normal breath sounds bilaterally, no wheezing, rales,rhonchi or crepitation. No use of accessory muscles of respiration.  CARDIOVASCULAR: S1, S2 normal. No murmurs, rubs, or gallops.  ABDOMEN: Soft, nontender, distended. Bowel sounds present. No organomegaly or mass.  EXTREMITIES: positive pedal edema, no cyanosis, or clubbing.  NEUROLOGIC: pt response to only painflu stimuli- by slight opening eyes and grunting. PSYCHIATRIC: drowsy.  SKIN: No obvious rash, lesion, or ulcer.   LABORATORY PANEL:   CBC  Recent Labs Lab  05/14/2015 0842  WBC 10.7  HGB 11.2*  HCT 34.5*  PLT 252  MCV 97.6  MCH 31.6  MCHC 32.4  RDW 18.6*   ------------------------------------------------------------------------------------------------------------------  Chemistries   Recent Labs Lab 05/03/2015 0842  NA 138  K 4.7  CL 105  CO2 19*  GLUCOSE 104*  BUN 62*  CREATININE 6.03*  CALCIUM 7.7*  AST 96*  ALT 15  ALKPHOS 123  BILITOT 4.3*   ------------------------------------------------------------------------------------------------------------------ estimated creatinine clearance is 7.7 mL/min (by C-G formula based on Cr of 6.03). ------------------------------------------------------------------------------------------------------------------ No results for input(s): TSH, T4TOTAL, T3FREE, THYROIDAB in the last 72 hours.  Invalid input(s): FREET3   Coagulation profile No results for input(s): INR, PROTIME in the last 168 hours. ------------------------------------------------------------------------------------------------------------------- No results for input(s): DDIMER in the last 72 hours. -------------------------------------------------------------------------------------------------------------------  Cardiac Enzymes  Recent Labs Lab 05/22/2015 0842  TROPONINI 0.12*   ------------------------------------------------------------------------------------------------------------------ Invalid input(s): POCBNP  ---------------------------------------------------------------------------------------------------------------  Urinalysis    Component Value Date/Time   COLORURINE AMBER* 05/18/2015 1054   COLORURINE YELLOW 04/21/2014 1906   APPEARANCEUR TURBID* 05/30/2015 1054   APPEARANCEUR CLEAR 04/21/2014 1906   LABSPEC 1.023 05/10/2015 1054   LABSPEC 1.010 04/21/2014 1906   PHURINE  05/20/2015 1054    TEST NOT REPORTED DUE TO COLOR INTERFERENCE OF URINE PIGMENT   PHURINE 6.0 04/21/2014 1906    GLUCOSEU * 05/14/2015 1054    TEST NOT REPORTED DUE TO COLOR INTERFERENCE OF URINE PIGMENT   GLUCOSEU 100 mg/dL 04/21/2014 1906   HGBUR * 05/30/2015 1054    TEST NOT REPORTED DUE TO COLOR INTERFERENCE OF URINE PIGMENT   HGBUR 1+ 04/21/2014 1906   BILIRUBINUR * 05/28/2015 1054    TEST NOT REPORTED DUE TO COLOR INTERFERENCE OF URINE PIGMENT   BILIRUBINUR NEGATIVE 04/21/2014 1906   BILIRUBINUR neg 02/12/2014 1424   KETONESUR * 05/25/2015 1054    TEST NOT REPORTED DUE TO COLOR INTERFERENCE OF URINE PIGMENT   KETONESUR NEGATIVE 04/21/2014 1906   PROTEINUR * 05/10/2015 1054    TEST NOT REPORTED DUE TO COLOR INTERFERENCE OF URINE PIGMENT   PROTEINUR NEGATIVE 04/21/2014 1906   PROTEINUR neg 02/12/2014 1424   UROBILINOGEN 1.0 02/12/2014 1424   NITRITE * 05/29/2015 1054    TEST NOT REPORTED DUE TO COLOR INTERFERENCE OF URINE PIGMENT   NITRITE NEGATIVE 04/21/2014 1906   NITRITE neg 02/12/2014 1424   LEUKOCYTESUR *  05/29/2015 1054    TEST NOT REPORTED DUE TO COLOR INTERFERENCE OF URINE PIGMENT   LEUKOCYTESUR 1+ 04/21/2014 1906     RADIOLOGY: Ct Head Wo Contrast  05/08/2015   CLINICAL DATA:  Altered mental status  EXAM: CT HEAD WITHOUT CONTRAST  TECHNIQUE: Contiguous axial images were obtained from the base of the skull through the vertex without intravenous contrast.  COMPARISON:  None.  FINDINGS: Skull and Sinuses:Negative for fracture or destructive process. The mastoids, middle ears, and imaged paranasal sinuses are clear.  Orbits: Right cataract resection at least.  No acute finding.  Brain: No evidence of acute infarction, hemorrhage, hydrocephalus, or mass lesion/mass effect.  Generalized cerebral volume loss and mild periventricular ischemic gliosis, expected for age.  IMPRESSION: Unremarkable head CT for age.   Electronically Signed   By: Monte Fantasia M.D.   On: 05/18/2015 09:43   Dg Chest Port 1 View  05/05/2015   CLINICAL DATA:  Altered mental status  EXAM: PORTABLE CHEST - 1 VIEW   COMPARISON:  04/21/2014  FINDINGS: Cardiac enlargement without heart failure. Left lower lobe consolidation has developed since prior study. Possible small left effusion. Right lung is clear  IMPRESSION: Cardiac enlargement  Left lower lobe consolidation which may represent atelectasis or pneumonia.   Electronically Signed   By: Franchot Gallo M.D.   On: 05/16/2015 09:48    EKG:  NSR.  IMPRESSION AND PLAN:  * Sepsis  Evident by hypothermia, hypotension, altered mental status.  Likely cause his SBP, but there is some evidence of atelectasis on chest x-ray also  As she was in hospital recently, I will give IV vancomycin and Zosyn for now.  Due to recent admission with GI issues, I will call GI consult, and get checked for C. Difficile.  * Acute on chronic renal failure due to dehydration  Severe worsening, most likely due to no oral intake in last 24 hours.  IV fluid and recheck tomorrow, currently electrolytes are stable.  * Liver cirrhosis  Continue lactulose, there is some ascites and a little edema on examination  Due to hypotension I will hold on Lasix for now.  We may need to 2 paracentesis if there remains a concern about source of sepsis.  * Diabetes  As she is with altered mental status and will be nothing by mouth, I will hold her insulin.  * Hyperlipidemia  Continue statin.  * Elevated troponin Likely secondary to renal failure, continue monitoring.  All the records are reviewed and case discussed with ED provider. Management plans discussed with the patient, family and they are in agreement.  CODE STATUS: Advance Directive Documentation        Most Recent Value   Type of Advance Directive  -- [pt arrived with DNR,  her son at bedside and her husband state that that is no correct and the patient wishes to be resusitated]   Pre-existing out of facility DNR order (yellow form or pink MOST form)     "MOST" Form in Place?         TOTAL TIME TAKING CARE OF THIS  PATIENT: 50 minutes.   Code status- DNR.  Vaughan Basta M.D on 05/26/2015   Between 7am to 6pm - Pager - [phone]  After 6pm go to [email or web address] - password [email or web address] [facility]  [facility]  Office  [phone]  CC: Primary care physician; No primary care provider on file.

## 2015-05-31 NOTE — Plan of Care (Signed)
Problem: Discharge Progression Outcomes Goal: Pain controlled with appropriate interventions Outcome: Progressing Moans at times  Goal: Complications resolved/controlled Outcome: Not Progressing Cont with ams. Unresponsive to  Voice and command Goal: Activity appropriate for discharge plan Outcome: Not Progressing Bedrest for now Goal: Other Discharge Outcomes/Goals Outcome: Not Progressing Plan of care with progress to goal. From [facility]. Pt there  For 10 days on c diff  Precautions. Gi to see. Foley in place from ed.

## 2015-05-31 NOTE — Progress Notes (Signed)
Paged and spoke vwith Dr. Lavetta Nielsen about pt fluid status, low BP 99/30 and rectal temp 94.6. Pt has generalized edema. Per Dr. Lavetta Nielsen leave fluids to help with kidney function.

## 2015-05-31 NOTE — Consult Note (Signed)
GI Inpatient Consult Note  Reason for Consult: Ascites & SBP   Attending Requesting Consult: Vachanni  History of Present Illness: Becky Roberts is a 81 y.o. female with a h/o multiple medical comorbidities including cirrhosis, ascites, and recent admission for sepsis/SBP admitted for altered mental status.  She was d/c to a rehab center two weeks ago after hospitalization and had been doing well per her son, who visits her daily.  She was found to be very drowsy and difficult to arouse yesterday morning, which not improve over the next 24 hours.  Patient was barely responsive to EMS, hypotensive and hypothermic in the [facility] ED, so she was admitted for sepsis .  Labs revealed declining renal function and dehydration, lactic acid 4.2, CK 302, BNP 254.  CXR showed some evidence of atelectasis.  C. diff studies pending.  Due to recent admission for SBP, she was started on IV vanc and zosyn empirically.  She was also started on lactulose for AMS, holding Lasix at this time.  GI was consulted for additional recommendations.   History was obtained from daughter present at bedside, as patient is still minimally responsive.  Per her daughter, he knows as much as described above.  Her brother stops by to see her mother at rehab every day, and she had been doing well until yesterday.  Patient noted she was feeling good, eating well, participating in PT, and making good progress until the change in mental status began suddenly and progressed fairly quickly.  Patient's daughter does not recall any new complaints from her mom, including liver-related problems like increased abdominal swelling or LE swelling, jaundice, itching, confusion, rectal bleeding, and dark stools.   Past Medical History:  Past Medical History  Diagnosis Date  . Blood transfusion reaction   . Diabetes mellitus   . Depression   . Hypertension   . Anemia 2012    hgb 5.5, admitted,  workup multifactorial,  b12 deficiency  .  Arthritis   . Asthma   . Portal hypertension February 2014    CT suggested a small nodular liver, multiple varices adjacent to the spleen and inferior mediastinum and paraesophageal region. No ascites.  . Breast cancer July 2,2013    T1c,N0,M0. ER + PR- , HER-2/neu not overexpressing..  . Cirrhosis   . Cholelithiases   . CKD (chronic kidney disease) stage 3, GFR 30-59 ml/min     Problem List: Patient Active Problem List   Diagnosis Date Noted  . Malnutrition of moderate degree 05/16/2015  . Sepsis 05/10/2015  . SBP (spontaneous bacterial peritonitis) 04/18/2015  . Abdominal pain 04/14/2015  . UTI (urinary tract infection) 04/14/2015  . Enteritis 04/14/2015  . Ascites 04/14/2015  . Cirrhosis of liver not due to alcohol 02/14/2014  . Chronic pain syndrome 01/14/2014  . Diarrhea 01/14/2014  . Urinary frequency 01/14/2014  . Gingivitis, chronic 01/13/2014  . Noncompliance of patient with dietary regimen 09/07/2013  . History of breast cancer 08/17/2013  . Dental infection 05/02/2013  . Restless legs syndrome 01/21/2013  . Chronic diastolic congestive heart failure 12/03/2012  . Chronic cholecystitis with calculus 11/05/2012  . Obesity (BMI 30-39.9) 04/17/2012  . Type II diabetes mellitus   . Depression   . Hypertension   . Arthritis   . COPD (chronic obstructive pulmonary disease)   . Breast cancer 03/31/2012  . Generalized muscle weakness 03/06/2012    Past Surgical History: Past Surgical History  Procedure Laterality Date  . Colonoscopy  2012  . Breast biopsy Right  July 2013    [name]: wide excision     Allergies: Allergies  Allergen Reactions  . Sulfa Antibiotics Hives  . Tylenol [Acetaminophen] Other (See Comments)    Has cirrhosis of the liver secondary to Tylenol use.  . Vioxx [Rofecoxib] Hives and Itching  . Aspirin Palpitations    Home Medications: Prescriptions prior to admission  Medication Sig Dispense Refill Last Dose  . albuterol (PROVENTIL  HFA;VENTOLIN HFA) 108 (90 BASE) MCG/ACT inhaler Inhale 2 puffs into the lungs every 6 (six) hours as needed for wheezing. 1 Inhaler 11 prn at prn  . ALPRAZolam (XANAX) 0.25 MG tablet Take 1 tablet (0.25 mg total) by mouth at bedtime as needed for anxiety. 15 tablet 0 05/24/2015 at prn  . atorvastatin (LIPITOR) 20 MG tablet Take 20 mg by mouth daily.   05/30/2015 at 0900  . Calcium Carbonate-Vitamin D (CALTRATE 600+D PO) Take 2 tablets by mouth daily.   05/02/2015 at 0600  . chlorhexidine (PERIDEX) 0.12 % solution Use as directed 15 mLs in the mouth or throat 2 (two) times daily. 120 mL 2 05/30/2015 at 1700  . cyanocobalamin (,VITAMIN B-12,) 1000 MCG/ML injection Inject 1 mL (1,000 mcg total) into the muscle every 30 (thirty) days. (Patient taking differently: Inject 1,000 mcg into the muscle every 30 (thirty) days. Pt uses on the 28th of every month.) 10 mL 12 05/20/2015  . diazepam (VALIUM) 2 MG tablet Take 2 mg by mouth 3 (three) times daily as needed for anxiety.   prn at prn  . exemestane (AROMASIN) 25 MG tablet Take 25 mg by mouth daily.    05/30/2015 at 0900  . furosemide (LASIX) 20 MG tablet Take 1 tablet (20 mg total) by mouth daily. 30 tablet 0 05/30/2015 at 0900  . lactulose (CHRONULAC) 10 GM/15ML solution Take 40 g by mouth daily as needed for mild constipation.   05/30/2015 at [place]  . megestrol (MEGACE) 40 MG tablet Take 2 tablets (80 mg total) by mouth 2 (two) times daily.   05/30/2015 at 1700  . metoCLOPramide (REGLAN) 10 MG tablet Take 10 mg by mouth 3 (three) times daily before meals.    05/24/2015 at 0600  . metoprolol tartrate (LOPRESSOR) 25 MG tablet TAKE ONE TABLET TWICE DAILY (Patient taking differently: Take 25 mg by mouth 2 (two) times daily. ) 180 tablet 3 05/30/2015 at 1700  . metroNIDAZOLE (FLAGYL) 500 MG tablet Take 500 mg by mouth 3 (three) times daily.   05/26/2015 at 0000  . nystatin (MYCOSTATIN) 100000 UNIT/ML suspension Use as directed 5 mLs (500,000 Units total) in the mouth or  throat 4 (four) times daily. 120 mL 0 05/18/2015 at 0600  . omeprazole (PRILOSEC) 20 MG capsule Take 20 mg by mouth daily.   05/19/2015 at 0600  . oxyCODONE (OXY IR/ROXICODONE) 5 MG immediate release tablet Take 1 tablet (5 mg total) by mouth every 8 (eight) hours as needed for moderate pain. 30 tablet 0 05/29/2015 at [place]  . rOPINIRole (REQUIP) 0.5 MG tablet Take 1 mg by mouth at bedtime.   05/30/2015 at 2100  . Vitamin D, Ergocalciferol, (DRISDOL) 50000 UNITS CAPS capsule Take 50,000 Units by mouth every 7 (seven) days. Pt takes on Monday.   05/30/2015 at 0900  . insulin aspart (NOVOLOG) 100 UNIT/ML injection Inject 0-5 Units into the skin at bedtime. 10 mL 11   . insulin aspart (NOVOLOG) 100 UNIT/ML injection Inject 0-9 Units into the skin 3 (three) times daily with meals. 10 mL 11   .  insulin detemir (LEVEMIR) 100 UNIT/ML injection 16 to 20 units daily (Patient taking differently: Inject 16-20 Units into the skin 3 (three) times daily as needed (for high blood sugar). Pt uses as needed per sliding scale.) 15 mL 12 Unknown  . liver oil-zinc oxide (DESITIN) 40 % ointment Apply topically as needed for irritation. 56.7 g 0 prn at prn  . nadolol (CORGARD) 20 MG tablet Take 1 tablet (20 mg total) by mouth daily. 30 tablet 0 05/27/2015 at 0900   Home medication reconciliation was completed with the patient.   Scheduled Inpatient Medications:   . atorvastatin  20 mg Oral Daily  . heparin  5,000 Units Subcutaneous 3 times per day  . metoCLOPramide  10 mg Oral TID AC  . metroNIDAZOLE  500 mg Oral TID  . nystatin  5 mL Mouth/Throat QID  . piperacillin-tazobactam (ZOSYN)  IV  3.375 g Intravenous Q12H    Continuous Inpatient Infusions:   . sodium chloride 75 mL/hr at 05/29/2015 1325    PRN Inpatient Medications:  albuterol, lactulose  Family History: family history includes Arthritis in her father, mother, and other; Cancer in her other; Diabetes in her father and mother; Hypertension in her father  and mother.    Social History:   reports that she has quit smoking. She has never used smokeless tobacco. She reports that she does not drink alcohol or use illicit drugs.    Review of Systems: Unable to perform, patient sleeping and difficult to arouse.   Physical Examination: BP 105/47 mmHg  Pulse 75  Temp(Src) 97.4 F (36.3 C) (Oral)  Resp 20  Wt 90.583 kg (199 lb 11.2 oz)  SpO2 97% Gen: Patient sleeping in bed, minimally aroused during exam, non-communicative and noninteractive Neck: supple, no JVD or thyromegaly Chest: CTA bilaterally, no wheezes, crackles, or other adventitious sounds CV: RRR, no m/g/c/r Abd: moderately distended, soft abdomen with ?mild ascites, +BS in all four quadrants; no HSM, guarding, ridigity, or rebound tenderness Ext: 1-2+ pedal edema bilaterally, pulses intact Skin: no rash or lesions noted Lymph: no LAD  Data: Lab Results  Component Value Date   WBC 10.7 05/04/2015   HGB 11.2* 05/22/2015   HCT 34.5* 05/28/2015   MCV 97.6 05/24/2015   PLT 252 05/29/2015    Recent Labs Lab 05/05/2015 0842  HGB 11.2*   Lab Results  Component Value Date   NA 138 05/08/2015   K 4.7 05/19/2015   CL 105 05/12/2015   CO2 19* 05/12/2015   BUN 62* 05/26/2015   CREATININE 6.03* 05/14/2015   Lab Results  Component Value Date   ALT 15 05/03/2015   AST 96* 05/06/2015   ALKPHOS 123 05/11/2015   BILITOT 4.3* 05/05/2015   No results for input(s): APTT, INR, PTT in the last 168 hours. Assessment/Plan: Ms. Rion is a 81 y.o. female a h/o multiple medical comorbidities including cirrhosis, ascites, and recent admission for sepsis/SBP admitted for altered mental status.  Given her presentation and recent admission with SBP, this is the most likely etiology.  I ordered a diagnostic paracentesis with gram stain and cell count to calculate total neutrophils.  If >250, patient will need IV albumin on days 1 and 3 of hospitalization.  Recommendations: -  Diagnostic paracentesis w/ gram stain and cell count ordered - If total neutrophils >250, will need IV albumin on days 1 and 3 of hosptialization - Continue lactulose, hold lasix for now   Thank you for the consult. We will follow along with you.  Please call with questions or concerns.  Becky Guise, PA-C [facility] Phone: [phone] Pager: [phone]

## 2015-06-01 ENCOUNTER — Inpatient Hospital Stay: Payer: Medicare HMO

## 2015-06-01 DIAGNOSIS — N179 Acute kidney failure, unspecified: Secondary | ICD-10-CM

## 2015-06-01 DIAGNOSIS — R6521 Severe sepsis with septic shock: Secondary | ICD-10-CM

## 2015-06-01 DIAGNOSIS — K7460 Unspecified cirrhosis of liver: Secondary | ICD-10-CM

## 2015-06-01 DIAGNOSIS — A419 Sepsis, unspecified organism: Principal | ICD-10-CM

## 2015-06-01 DIAGNOSIS — R579 Shock, unspecified: Secondary | ICD-10-CM

## 2015-06-01 LAB — CBC
HCT: 33.6 % — ABNORMAL LOW (ref 35.0–47.0)
HEMOGLOBIN: 10.8 g/dL — AB (ref 12.0–16.0)
MCH: 31.8 pg (ref 26.0–34.0)
MCHC: 32.1 g/dL (ref 32.0–36.0)
MCV: 98.9 fL (ref 80.0–100.0)
PLATELETS: 177 10*3/uL (ref 150–440)
RBC: 3.4 MIL/uL — AB (ref 3.80–5.20)
RDW: 18.5 % — ABNORMAL HIGH (ref 11.5–14.5)
WBC: 10.4 10*3/uL (ref 3.6–11.0)

## 2015-06-01 LAB — BLOOD GAS, ARTERIAL
ACID-BASE DEFICIT: 11.1 mmol/L — AB (ref 0.0–2.0)
Allens test (pass/fail): POSITIVE — AB
BICARBONATE: 13.8 meq/L — AB (ref 21.0–28.0)
FIO2: 0.21
O2 SAT: 97 %
PATIENT TEMPERATURE: 37
PO2 ART: 100 mmHg (ref 83.0–108.0)
pCO2 arterial: 28 mmHg — ABNORMAL LOW (ref 32.0–48.0)
pH, Arterial: 7.3 — ABNORMAL LOW (ref 7.350–7.450)

## 2015-06-01 LAB — BASIC METABOLIC PANEL
ANION GAP: 14 (ref 5–15)
BUN: 61 mg/dL — ABNORMAL HIGH (ref 6–20)
CALCIUM: 6.5 mg/dL — AB (ref 8.9–10.3)
CO2: 15 mmol/L — AB (ref 22–32)
Chloride: 109 mmol/L (ref 101–111)
Creatinine, Ser: 6.11 mg/dL — ABNORMAL HIGH (ref 0.44–1.00)
GFR, EST AFRICAN AMERICAN: 7 mL/min — AB (ref >60)
GFR, EST NON AFRICAN AMERICAN: 6 mL/min — AB (ref >60)
Glucose, Bld: 101 mg/dL — ABNORMAL HIGH (ref 65–99)
Potassium: 4.7 mmol/L (ref 3.5–5.1)
SODIUM: 138 mmol/L (ref 135–145)

## 2015-06-01 LAB — GLUCOSE, CAPILLARY: Glucose-Capillary: 88 mg/dL (ref 65–99)

## 2015-06-01 LAB — VANCOMYCIN, RANDOM: VANCOMYCIN RM: 11 ug/mL

## 2015-06-01 MED ORDER — ALBUMIN HUMAN 25 % IV SOLN
25.0000 g | Freq: Once | INTRAVENOUS | Status: DC
  Filled 2015-06-01: qty 500

## 2015-06-01 MED ORDER — NOREPINEPHRINE BITARTRATE 1 MG/ML IV SOLN: 0.0000 ug/min | INTRAVENOUS | Status: DC

## 2015-06-01 MED ORDER — MORPHINE SULFATE (PF) 2 MG/ML IV SOLN
2.0000 mg | INTRAVENOUS | Status: DC | PRN
  Administered 2015-06-01: 2 mg via INTRAVENOUS

## 2015-06-01 MED ORDER — SODIUM BICARBONATE 8.4 % IV SOLN
INTRAVENOUS | Status: DC
  Administered 2015-06-01: 11:00:00 via INTRAVENOUS
  Filled 2015-06-01 (×3): qty 150

## 2015-06-01 MED ORDER — VANCOMYCIN HCL IN DEXTROSE 750-5 MG/150ML-% IV SOLN
750.0000 mg | INTRAVENOUS | Status: DC
  Filled 2015-06-01: qty 150

## 2015-06-01 MED ORDER — NOREPINEPHRINE 4 MG/250ML-% IV SOLN
0.0000 ug/min | INTRAVENOUS | Status: DC
  Administered 2015-06-01: 5 ug/min via INTRAVENOUS
  Filled 2015-06-01: qty 250

## 2015-06-01 MED ORDER — METRONIDAZOLE IN NACL 5-0.79 MG/ML-% IV SOLN
500.0000 mg | Freq: Three times a day (TID) | INTRAVENOUS | Status: DC
  Administered 2015-06-01: 500 mg via INTRAVENOUS
  Filled 2015-06-01 (×4): qty 100

## 2015-06-01 MED ORDER — MORPHINE SULFATE (PF) 2 MG/ML IV SOLN
INTRAVENOUS | Status: AC
  Filled 2015-06-01: qty 1

## 2015-06-01 MED ORDER — INSULIN ASPART 100 UNIT/ML ~~LOC~~ SOLN: 0.0000 [IU] | SUBCUTANEOUS | Status: DC

## 2015-06-01 MED ORDER — LORAZEPAM 2 MG/ML IJ SOLN
1.0000 mg | INTRAMUSCULAR | Status: DC | PRN
  Administered 2015-06-01: 1 mg via INTRAVENOUS

## 2015-06-01 MED ORDER — PANTOPRAZOLE SODIUM 40 MG IV SOLR: 40.0000 mg | INTRAVENOUS | Status: DC

## 2015-06-01 MED ORDER — LORAZEPAM 2 MG/ML IJ SOLN
INTRAMUSCULAR | Status: AC
  Filled 2015-06-01: qty 1

## 2015-06-01 MED ORDER — INSULIN ASPART 100 UNIT/ML ~~LOC~~ SOLN: 0.0000 [IU] | Freq: Three times a day (TID) | SUBCUTANEOUS | Status: DC

## 2015-06-01 MED ORDER — SODIUM CHLORIDE 0.9 % IV SOLN: 1.0000 mg/h | INTRAVENOUS | Status: DC

## 2015-06-02 NOTE — Clinical Social Work Note (Signed)
In addition to previous CSW note, patient has medicare humana and will require reauth prior to return to [facility]. Shela Leff MSW,LCSW [phone]

## 2015-06-02 NOTE — Plan of Care (Signed)
Problem: Discharge Progression Outcomes Goal: Other Discharge Outcomes/Goals Outcome: Not Progressing Called  Dr. Volanda Napoleon in the early am, about patient overall condition BP and body temp she ordered Bearhugger and  we placed bearhugger  Rechecked temp still low MD aware of temp and BP  Patient given morphine x1 for pain Patient transferred to step down per Dr Volanda Napoleon Possible paracentesis later today in BP comes up  Report called to CCU nurse and patient transferred to CCU stepdown

## 2015-06-02 NOTE — Progress Notes (Signed)
Patient was brought from floor unresponsive- low BP- guppy breathing- Dr. Alva Garnet and Dr. Volanda Napoleon aware- Plan was to start CRRT- trialysis was placed by Dr. Alva Garnet. Patient oxygen saturation dropped to 70's- Patient placed on non-rebreather and Dr. Volanda Napoleon notified- Patient turning cyanotic in the face- Pt then made comfort care per Dr. Volanda Napoleon. Patient passed at 16:50.  Family at bedside with [place].

## 2015-06-02 NOTE — Discharge Summary (Signed)
[place] at [facility]    Death Summary  Ms. Moseman is an 81 year old female with known history of diabetes, hypertension, cirrhosis with ascites, chronic kidney disease stage III who had recently been admitted for spontaneous bacterial peritonitis complicated by renal failure. She was discharged to a rehabilitation Center 2 weeks ago and had been doing very well with physical therapy until approximately 24 hours prior to discharge when it was noted that she was lethargic. She was then brought to emergency room for evaluation at that time she was nonresponsive, hypotensive and hypothermic. Her UA is positive for urinary tract infection. Paracentesis had been planned to evaluate for spontaneous bacterial peritonitis but this was not able to be performed due to hypotension throughout the day today. She was also in acute renal failure with a creatinine of 6. She was seen by the nephrology service and emergent dialysis was planned. Hemodialysis access was placed by intensive care service. She was started on levo fed in the ICU. Unfortunately, even on levo fed her blood pressures remained very low, she developed respiratory distress. She was already a DO NOT RESUSCITATE from prior admissions. I met with the family multiple times throughout the day to discuss prognosis and treatment options. This afternoon when she developed respiratory distress and hypotension resistant to fluid and pressor agents we made the decision to transition to comfort care. She was started on morphine and Ativan for air hunger and anxiety. Levophed was discontinued. She passed soon after.    [place] [id number],[id number] is a 81 y.o. female, Outpatient Primary MD for the patient is No primary care provider on file.  Pronounced dead by Charyl Bigger RN on  8/31    @  1700                 Cause of death sepsis, acute renal failure   Myrtis Ser M.D on 03-Jun-2015 at  5:04 PM  [place] at [place] [phone]  Total clinical and documentation time for today 65 minutes

## 2015-06-02 NOTE — Plan of Care (Signed)
Problem: Discharge Progression Outcomes Goal: Other Discharge Outcomes/Goals Outcome: Progressing Plan of Care Progress to Goal:   Pt BP is low and pt is still non responsive. Pt was moaning once during shift and was given morphine. MD was paged about pt BP, rectal temp and level of care. Per Dr. Marcille Blanco, we should leave fluids hanging even though pt has generalized edema. No other signs of distress noted. Will continue to monitor.

## 2015-06-02 NOTE — Procedures (Signed)
PROCEDURE NOTE: HD CATH PLACEMENT  INDICATION:    CRRT  CONSENT:   Risks of procedure as well as the alternatives were explained to the patient or surrogate. Consent for procedure obtained. A time out was performed.   PROCEDURE  Sterile technique was used including antiseptics, cap, gloves, gown, hand hygiene, mask and full body sheet.  Skin prep: Chlorhexidine; local anesthetic administered  A triple lumen catheter was placed in the R IJ vein using the Seldinger technique.  Ultrasound was used for vessel identification and guidance.   EVALUATION:  Blood flow good  Complications: No apparent complications  Patient tolerated the procedure well.  Chest X-ray ordered to verify placement and is pending   Merton Border, MD PCCM service Mobile [phone]

## 2015-06-02 NOTE — Progress Notes (Signed)
Called Dr Volanda Napoleon about patient overall condition  BP Temp and LOC she is coming by to see patient right

## 2015-06-02 NOTE — Progress Notes (Signed)
Got a verbal consent for Paracentesis over phone from son  (Scotty Hendricks Milo), second nurse verified

## 2015-06-02 NOTE — Consult Note (Signed)
ANTIBIOTIC CONSULT NOTE - INITIAL  Pharmacy Consult for vancomycin/zosyn Indication: rule out sepsis  Allergies  Allergen Reactions  . Sulfa Antibiotics Hives  . Tylenol [Acetaminophen] Other (See Comments)    Has cirrhosis of the liver secondary to Tylenol use.  . Vioxx [Rofecoxib] Hives and Itching  . Aspirin Palpitations    Patient Measurements: Height: 5' 2"  (157.5 cm) Weight: 213 lb 14.4 oz (97.024 kg) IBW/kg (Calculated) : 50.1 Adjusted Body Weight: 66.3kg  Vital Signs: Temp: 93.7 F (34.3 C) (08/31 1245) Temp Source: Rectal (08/31 1245) BP: 78/42 mmHg (08/31 1134) Pulse Rate: 63 (08/31 1134) Intake/Output from previous day: 08/30 0701 - 08/31 0700 In: 343.8 [I.V.:293.8; IV Piggyback:50] Out: 20 [Urine:20]  Labs:  Recent Labs  05/19/2015 0842 June 21, 2015 0805  WBC 10.7 10.4  HGB 11.2* 10.8*  PLT 252 177  CREATININE 6.03* 6.11*   Estimated Creatinine Clearance: 7.9 mL/min (by C-G formula based on Cr of 6.11).  Recent Labs  June 21, 2015 0805  [facility] 11     Microbiology: Recent Results (from the past 720 hour(s))  Body fluid culture     Status: None   Collection Time: 05/10/15  6:30 PM  Result Value Ref Range Status   Specimen Description PERITONEAL  Final   Special Requests Normal  Final   Gram Stain MANY WBC SEEN NO ORGANISMS SEEN   Final   Culture NO GROWTH 4 DAYS  Final   Report Status 05/14/2015 FINAL  Final  Blood culture (routine x 2)     Status: None   Collection Time: 05/10/15  6:58 PM  Result Value Ref Range Status   Specimen Description BLOOD LEFT ANTECUBITAL  Final   Special Requests BOTTLES DRAWN AEROBIC AND ANAEROBIC 5ML  Final   Culture  Setup Time   Final    GRAM POSITIVE COCCI IN BOTH AEROBIC AND ANAEROBIC BOTTLES PREVIOUSLY CALLED TO DOLL FERGUSON AT 0626 05/11/15 BY TB CONFIRMED BY TB    Culture   Final    STAPHYLOCOCCUS EPIDERMIDIS IN BOTH AEROBIC AND ANAEROBIC BOTTLES WARNING: For oxacillin-resistant S.aureus and  coagulase-negative staphylococci (MRS), other beta-lactam agents, ie, penicillins, beta-lactam/beta-lactamase inhibitor combinations, cephems (with the exception of the cephalosporins with anti-MRSA  activity), and carbapenems, may appear active in vitro, but are not effective clinically.  --CLSI, Vol.32 No.3, January 2012, pg 70.    Report Status 05/13/2015 FINAL  Final   Organism ID, Bacteria STAPHYLOCOCCUS EPIDERMIDIS  Final      Susceptibility   Staphylococcus epidermidis - MIC*    CIPROFLOXACIN >=8 RESISTANT Resistant     ERYTHROMYCIN >=8 RESISTANT Resistant     GENTAMICIN <=0.5 SENSITIVE Sensitive     OXACILLIN Value in next row Resistant      >=4 RESISTANTWARNING: For oxacillin-resistant S.aureus and coagulase-negative staphylococci (MRS), other beta-lactam agents, ie, penicillins, beta-lactam/beta-lactamase inhibitor combinations, cephems (with the exception of the cephalosporins with anti-MRSA activity), and carbapenems, may appear active in vitro, but are not effective clinically.  --CLSI, Vol.32 No.3, January 2012, pg 70.    TETRACYCLINE Value in next row Sensitive      >=4 RESISTANTWARNING: For oxacillin-resistant S.aureus and coagulase-negative staphylococci (MRS), other beta-lactam agents, ie, penicillins, beta-lactam/beta-lactamase inhibitor combinations, cephems (with the exception of the cephalosporins with anti-MRSA activity), and carbapenems, may appear active in vitro, but are not effective clinically.  --CLSI, Vol.32 No.3, January 2012, pg 70.    VANCOMYCIN Value in next row Sensitive      >=4 RESISTANTWARNING: For oxacillin-resistant S.aureus and coagulase-negative staphylococci (MRS), other beta-lactam agents,  ie, penicillins, beta-lactam/beta-lactamase inhibitor combinations, cephems (with the exception of the cephalosporins with anti-MRSA activity), and carbapenems, may appear active in vitro, but are not effective clinically.  --CLSI, Vol.32 No.3, January 2012, pg 70.     CLINDAMYCIN Value in next row Sensitive      >=4 RESISTANTWARNING: For oxacillin-resistant S.aureus and coagulase-negative staphylococci (MRS), other beta-lactam agents, ie, penicillins, beta-lactam/beta-lactamase inhibitor combinations, cephems (with the exception of the cephalosporins with anti-MRSA activity), and carbapenems, may appear active in vitro, but are not effective clinically.  --CLSI, Vol.32 No.3, January 2012, pg 70.    LINEZOLID Value in next row Sensitive      SENSITIVE2    CEFAZOLIN Value in next row Resistant      SENSITIVE2    * STAPHYLOCOCCUS EPIDERMIDIS  Blood culture (routine x 2)     Status: None   Collection Time: 05/10/15  6:58 PM  Result Value Ref Range Status   Specimen Description BLOOD LEFT WRIST  Final   Special Requests BOTTLES DRAWN AEROBIC AND ANAEROBIC 2ML  Final   Culture  Setup Time   Final    GRAM POSITIVE COCCI ANAEROBIC BOTTLE ONLY CRITICAL RESULT CALLED TO, READ BACK BY AND VERIFIED WITH: DOLL FERGUSON ON 05/11/15 AT 89 PM BY TB. CONFIRMED BY TB/SDR    Culture   Final    STAPHYLOCOCCUS EPIDERMIDIS WARNING: For oxacillin-resistant S.aureus and coagulase-negative staphylococci (MRS), other beta-lactam agents, ie, penicillins, beta-lactam/beta-lactamase inhibitor combinations, cephems (with the exception of the cephalosporins with anti-MRSA  activity), and carbapenems, may appear active in vitro, but are not effective clinically.  --CLSI, Vol.32 No.3, January 2012, pg 70.    Report Status 05/14/2015 FINAL  Final   Organism ID, Bacteria STAPHYLOCOCCUS EPIDERMIDIS  Final      Susceptibility   Staphylococcus epidermidis - MIC*    CIPROFLOXACIN >=8 RESISTANT Resistant     ERYTHROMYCIN >=8 RESISTANT Resistant     GENTAMICIN >=16 RESISTANT Resistant     OXACILLIN Value in next row Resistant      >=4 RESISTANTWARNING: For oxacillin-resistant S.aureus and coagulase-negative staphylococci (MRS), other beta-lactam agents, ie, penicillins,  beta-lactam/beta-lactamase inhibitor combinations, cephems (with the exception of the cephalosporins with anti-MRSA activity), and carbapenems, may appear active in vitro, but are not effective clinically.  --CLSI, Vol.32 No.3, January 2012, pg 70.    TETRACYCLINE Value in next row Resistant      >=4 RESISTANTWARNING: For oxacillin-resistant S.aureus and coagulase-negative staphylococci (MRS), other beta-lactam agents, ie, penicillins, beta-lactam/beta-lactamase inhibitor combinations, cephems (with the exception of the cephalosporins with anti-MRSA activity), and carbapenems, may appear active in vitro, but are not effective clinically.  --CLSI, Vol.32 No.3, January 2012, pg 70.    VANCOMYCIN Value in next row Sensitive      >=4 RESISTANTWARNING: For oxacillin-resistant S.aureus and coagulase-negative staphylococci (MRS), other beta-lactam agents, ie, penicillins, beta-lactam/beta-lactamase inhibitor combinations, cephems (with the exception of the cephalosporins with anti-MRSA activity), and carbapenems, may appear active in vitro, but are not effective clinically.  --CLSI, Vol.32 No.3, January 2012, pg 70.    CLINDAMYCIN Value in next row Resistant      >=4 RESISTANTWARNING: For oxacillin-resistant S.aureus and coagulase-negative staphylococci (MRS), other beta-lactam agents, ie, penicillins, beta-lactam/beta-lactamase inhibitor combinations, cephems (with the exception of the cephalosporins with anti-MRSA activity), and carbapenems, may appear active in vitro, but are not effective clinically.  --CLSI, Vol.32 No.3, January 2012, pg 70.    LINEZOLID Value in next row Sensitive      SENSITIVE1    CEFAZOLIN Value in next  row Resistant      SENSITIVE1    LEVOFLOXACIN Value in next row Resistant      RESISTANT>=8    * STAPHYLOCOCCUS EPIDERMIDIS  C difficile quick scan w PCR reflex     Status: Abnormal   Collection Time: 05/11/15  6:35 PM  Result Value Ref Range Status   C Diff antigen POSITIVE (A)  NEGATIVE Final   C Diff toxin NEGATIVE NEGATIVE Final   C Diff interpretation   Final    Negative for toxigenic C. difficile. Toxin gene and active toxin production not detected. May be a nontoxigenic strain of C. difficile bacteria present, lacking the ability to produce toxin.  Clostridium Difficile by PCR     Status: None   Collection Time: 05/11/15  6:35 PM  Result Value Ref Range Status   Toxigenic C Difficile by pcr NEGATIVE NEGATIVE Final  Culture, blood (routine x 2)     Status: None   Collection Time: 05/12/15  9:35 AM  Result Value Ref Range Status   Specimen Description BLOOD LEFT WRIST  Final   Special Requests BOTTLES DRAWN AEROBIC AND ANAEROBIC  3 CC  Final   Culture NO GROWTH 5 DAYS  Final   Report Status 05/17/2015 FINAL  Final  Culture, blood (routine x 2)     Status: None   Collection Time: 05/12/15  9:35 AM  Result Value Ref Range Status   Specimen Description BLOOD LEFT ASSIST CONTROL  Final   Special Requests BOTTLES DRAWN AEROBIC AND ANAEROBIC  5 CC  Final   Culture NO GROWTH 5 DAYS  Final   Report Status 05/17/2015 FINAL  Final  Culture, blood (routine x 2)     Status: None (Preliminary result)   Collection Time: 05/23/2015  8:15 AM  Result Value Ref Range Status   Specimen Description BLOOD LEFT ASSIST CONTROL  Final   Special Requests BOTTLES DRAWN AEROBIC AND ANAEROBIC  1CC  Final   Culture NO GROWTH 1 DAY  Final   Report Status PENDING  Incomplete  Culture, blood (routine x 2)     Status: None (Preliminary result)   Collection Time: 05/19/2015  8:30 AM  Result Value Ref Range Status   Specimen Description BLOOD LEFT ASSIST CONTROL  Final   Special Requests BOTTLES DRAWN AEROBIC AND ANAEROBIC  2 CC  Final   Culture  Setup Time   Final    GRAM POSITIVE COCCI IN CLUSTERS ANAEROBIC BOTTLE ONLY CRITICAL RESULT CALLED TO, READ BACK BY AND VERIFIED WITH: CHARLI FLEETWOOD AT 1302 06-28-15 DV    Culture GRAM POSITIVE COCCI IDENTIFICATION TO FOLLOW   Final    Report Status PENDING  Incomplete  Urine culture     Status: None (Preliminary result)   Collection Time: 05/13/2015 10:54 AM  Result Value Ref Range Status   Specimen Description URINE, CATHETERIZED  Final   Special Requests Normal  Final   Culture TOO YOUNG TO READ  Final   Report Status PENDING  Incomplete  C difficile quick scan w PCR reflex     Status: None   Collection Time: 05/22/2015  6:25 PM  Result Value Ref Range Status   C Diff antigen NEGATIVE NEGATIVE Final   C Diff toxin NEGATIVE NEGATIVE Final   C Diff interpretation Negative for C. difficile  Final    Medical History: Past Medical History  Diagnosis Date  . Blood transfusion reaction   . Diabetes mellitus   . Depression   . Hypertension   .  Anemia 2012    hgb 5.5, admitted,  workup multifactorial,  b12 deficiency  . Arthritis   . Asthma   . Portal hypertension February 2014    CT suggested a small nodular liver, multiple varices adjacent to the spleen and inferior mediastinum and paraesophageal region. No ascites.  . Breast cancer July 2,2013    T1c,N0,M0. ER + PR- , HER-2/neu not overexpressing..  . Cirrhosis   . Cholelithiases   . CKD (chronic kidney disease) stage 3, GFR 30-59 ml/min     Medications:  Scheduled:  . albumin human  25 g Intravenous Once  . antiseptic oral rinse  7 mL Mouth Rinse BID  . heparin  5,000 Units Subcutaneous 3 times per day  . Influenza vac split quadrivalent PF  0.5 mL Intramuscular Tomorrow-1000  . metronidazole  500 mg Intravenous Q8H  . piperacillin-tazobactam (ZOSYN)  IV  3.375 g Intravenous Q12H  . rifaximin  550 mg Oral BID  . vancomycin  750 mg Intravenous Q48H   Assessment: Pt is a 81 year old female discharged 2 weeks ago being admitted for altered mental status hypotension, hypothermia, some atelecatis of chest x-ray. Vanc and Zosyn ordered. Patient has worsening renal function with CrCl 7.7 ml/min.  Goal of Therapy:  Vancomycin trough level 15-20 mcg/ml  Plan:   Patient was given Vancomycin 1g IV x1 in ED.   Random vancomycin level drawn this AM was subtherapeutic at 11.   Scheduled patient on vancomycin 749m IV q48 hrs with next vancomycin level scheduled for 9/2 @ 1300 (prior to dose on that day). This level will not be indicative of steady state, as the patient's t1/2= 65 hours.  Ke= 0.010 hr-1 T1/2= 65 hrs Vd= 67.9L Estimated Cpk= 26.86 Estimated Ctr= 16.63  Zosyn 3.375 q 12hr due to renal function EI dosing.   Will  Continue to monitor renal function.  Pharmacy will continue to follow.  AVena [id number] PM

## 2015-06-02 NOTE — Care Management (Signed)
Admitted to [facility] center with the diagnosis of sepsis. Discharged to [facility] 05/19/15. Son is Air traffic controller Mabe [phone]).  BUN 62, creatinine 6.03 on admission. Observed for renal disease during last admission. Found unresponsive and temperature of 94 this morning. Dr. Volanda Napoleon ordered ABG and paracentesis.  Son, [place], updated. Shelbie Ammons RN MSN Care Management [phone]

## 2015-06-02 NOTE — Progress Notes (Signed)
   2015-06-13 1700  Clinical Encounter Type  Visited With Patient and family together  Visit Type Follow-up;Death  Referral From Nurse  Consult/Referral To Chaplain  Spiritual Encounters  Spiritual Needs Ritual;Emotional;Grief support  Stress Factors  Patient Stress Factors Loss  Family Stress Factors Loss  Met w/family following patient's death. Provided sacramental ministration for the dead and grief care for family. Escorted family members for final farewells. Chap. Ryler Laskowski G. [place]
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
## 2015-06-02 NOTE — Progress Notes (Signed)
Patient was evaluated earlier in day, deemed she needed CRRT, however patient passed before this could be instituted.

## 2015-06-02 NOTE — Consult Note (Signed)
PULMONARY / CRITICAL CARE MEDICINE   Name: Becky Roberts MRN: [id number] DOB: Dec 19, 1931    ADMISSION DATE:  05/03/2015 CONSULTATION DATE:  8/31   INITIAL PRESENTATION:  89 F with ESLD, CKD, recurrent SBP, poor baseline functional status. Admitted 8/09 - 8/18 with severe sepsis due to SBP. She was seen by Palliative Care during that hospitalization and made DNR. She was improving in NH until admission 8/30 with AMS and diagnosis of severe sepsis, likely due to recurrent SBP. Transferred to ICU 8/31 with worsening LOC, shock, worsening renal function. PCCM asked to assist in mgmt of hypotension and to place HD cath for CRRT  MAJOR EVENTS/TEST RESULTS: 8/30 CT head: NAD   INDWELLING DEVICES:: R IJ HD cath 8/31 >>   MICRO DATA: C diff 8/30 >> NEG Urine 8/30 >>  Blood 8/30 >> 1/2 GPC clusters >>   ANTIMICROBIALS:  Metronidazole 8/30 >> 8/31 Vanc 8/30 >>  Pip-tazo 8/30 >>    PAST MEDICAL HISTORY :   has a past medical history of Blood transfusion reaction; Diabetes mellitus; Depression; Hypertension; Anemia (2012); Arthritis; Asthma; Portal hypertension (February 2014); Breast cancer (July 2,2013); Cirrhosis; Cholelithiases; and CKD (chronic kidney disease) stage 3, GFR 30-59 ml/min.  has past surgical history that includes Colonoscopy (2012) and Breast biopsy (Right, July 2013). Prior to Admission medications   Medication Sig Start Date End Date Taking? Authorizing Provider  albuterol (PROVENTIL HFA;VENTOLIN HFA) 108 (90 BASE) MCG/ACT inhaler Inhale 2 puffs into the lungs every 6 (six) hours as needed for wheezing. 12/17/12  Yes Crecencio Mc, MD  ALPRAZolam Duanne Moron) 0.25 MG tablet Take 1 tablet (0.25 mg total) by mouth at bedtime as needed for anxiety. 05/19/15  Yes Henreitta Leber, MD  atorvastatin (LIPITOR) 20 MG tablet Take 20 mg by mouth daily.   Yes Historical Provider, MD  Calcium Carbonate-Vitamin D (CALTRATE 600+D PO) Take 2 tablets by mouth daily.   Yes Historical  Provider, MD  chlorhexidine (PERIDEX) 0.12 % solution Use as directed 15 mLs in the mouth or throat 2 (two) times daily. 01/13/14  Yes Crecencio Mc, MD  cyanocobalamin (,VITAMIN B-12,) 1000 MCG/ML injection Inject 1 mL (1,000 mcg total) into the muscle every 30 (thirty) days. Patient taking differently: Inject 1,000 mcg into the muscle every 30 (thirty) days. Pt uses on the 28th of every month. 07/09/12  Yes Crecencio Mc, MD  diazepam (VALIUM) 2 MG tablet Take 2 mg by mouth 3 (three) times daily as needed for anxiety.   Yes Historical Provider, MD  exemestane (AROMASIN) 25 MG tablet Take 25 mg by mouth daily.    Yes Historical Provider, MD  furosemide (LASIX) 20 MG tablet Take 1 tablet (20 mg total) by mouth daily. 04/18/15  Yes Srikar Sudini, MD  lactulose (CHRONULAC) 10 GM/15ML solution Take 40 g by mouth daily as needed for mild constipation.   Yes Historical Provider, MD  megestrol (MEGACE) 40 MG tablet Take 2 tablets (80 mg total) by mouth 2 (two) times daily. 05/19/15  Yes Henreitta Leber, MD  metoCLOPramide (REGLAN) 10 MG tablet Take 10 mg by mouth 3 (three) times daily before meals.    Yes Historical Provider, MD  metoprolol tartrate (LOPRESSOR) 25 MG tablet TAKE ONE TABLET TWICE DAILY Patient taking differently: Take 25 mg by mouth 2 (two) times daily.  04/14/14  Yes Minna Merritts, MD  metroNIDAZOLE (FLAGYL) 500 MG tablet Take 500 mg by mouth 3 (three) times daily.   Yes Historical Provider, MD  nystatin (MYCOSTATIN) 100000 UNIT/ML suspension Use as directed 5 mLs (500,000 Units total) in the mouth or throat 4 (four) times daily. 04/18/15  Yes Srikar Sudini, MD  omeprazole (PRILOSEC) 20 MG capsule Take 20 mg by mouth daily.   Yes Historical Provider, MD  oxyCODONE (OXY IR/ROXICODONE) 5 MG immediate release tablet Take 1 tablet (5 mg total) by mouth every 8 (eight) hours as needed for moderate pain. 05/19/15  Yes Henreitta Leber, MD  rOPINIRole (REQUIP) 0.5 MG tablet Take 1 mg by mouth at  bedtime.   Yes Historical Provider, MD  Vitamin D, Ergocalciferol, (DRISDOL) 50000 UNITS CAPS capsule Take 50,000 Units by mouth every 7 (seven) days. Pt takes on Monday.   Yes Historical Provider, MD  insulin aspart (NOVOLOG) 100 UNIT/ML injection Inject 0-5 Units into the skin at bedtime. 05/19/15   Henreitta Leber, MD  insulin aspart (NOVOLOG) 100 UNIT/ML injection Inject 0-9 Units into the skin 3 (three) times daily with meals. 05/19/15   Henreitta Leber, MD  insulin detemir (LEVEMIR) 100 UNIT/ML injection 16 to 20 units daily Patient taking differently: Inject 16-20 Units into the skin 3 (three) times daily as needed (for high blood sugar). Pt uses as needed per sliding scale. 12/16/12   Crecencio Mc, MD  liver oil-zinc oxide (DESITIN) 40 % ointment Apply topically as needed for irritation. 05/19/15   Henreitta Leber, MD  nadolol (CORGARD) 20 MG tablet Take 1 tablet (20 mg total) by mouth daily. 04/18/15   Hillary Bow, MD   Allergies  Allergen Reactions  . Sulfa Antibiotics Hives  . Tylenol [Acetaminophen] Other (See Comments)    Has cirrhosis of the liver secondary to Tylenol use.  . Vioxx [Rofecoxib] Hives and Itching  . Aspirin Palpitations    FAMILY HISTORY:  indicated that her mother is deceased. She indicated that her father is deceased. She indicated that her other is alive.  SOCIAL HISTORY:  reports that she has quit smoking. She has never used smokeless tobacco. She reports that she does not drink alcohol or use illicit drugs.  REVIEW OF SYSTEMS:  Cannot be obtained  SUBJECTIVE:   VITAL SIGNS: Temp:  [92.6 F (33.7 C)-94.6 F (34.8 C)] 93.7 F (34.3 C) (08/31 1245) Pulse Rate:  [63-71] 63 (08/31 1134) Resp:  [16-20] 16 (08/31 1134) BP: (78-99)/(27-42) 78/42 mmHg (08/31 1134) SpO2:  [98 %-100 %] 98 % (08/31 1134) HEMODYNAMICS:   VENTILATOR SETTINGS:   INTAKE / OUTPUT:  Intake/Output Summary (Last 24 hours) at 06/23/15 1518 Last data filed at June 23, 2015 1126   Gross per 24 hour  Intake 343.75 ml  Output     40 ml  Net 303.75 ml    PHYSICAL EXAMINATION: General: Chronically ill appearing, Kussmaul type resp pattern, RASS -4 Neuro: RASS -4, not F/C, PERRL, EOMI, minimal withdrawal from pain, no pathological reflexes HEENT: + sclericterus, NCAT Cardiovascular: IR, no M, distant HS Lungs: Clear anteriorly, no adventitious sounds Abdomen: distended, diminished BS, + fluid wave Ext: muscular atrophy, no LE edema, scattered ecchymoses  LABS:  CBC  Recent Labs Lab 05/07/2015 0842 Jun 23, 2015 0805  WBC 10.7 10.4  HGB 11.2* 10.8*  HCT 34.5* 33.6*  PLT 252 177   Coag's No results for input(s): APTT, INR in the last 168 hours. BMET  Recent Labs Lab 05/10/2015 0842 23-Jun-2015 0805  NA 138 138  K 4.7 4.7  CL 105 109  CO2 19* 15*  BUN 62* 61*  CREATININE 6.03* 6.11*  GLUCOSE 104* 101*  Electrolytes  Recent Labs Lab 05/13/2015 0842 06/26/2015 0805  CALCIUM 7.7* 6.5*   Sepsis Markers  Recent Labs Lab 05/10/2015 0842 05/13/2015 1610  LATICACIDVEN 4.2* 4.3*   ABG  Recent Labs Lab 06-26-2015 0825  PHART 7.30*  PCO2ART 28*  PO2ART 100   Liver Enzymes  Recent Labs Lab 05/23/2015 0842  AST 96*  ALT 15  ALKPHOS 123  BILITOT 4.3*  ALBUMIN 2.2*   Cardiac Enzymes  Recent Labs Lab 05/23/2015 0842 05/16/2015 1610 05/15/2015 2140  TROPONINI 0.12* 0.11* 0.10*   Glucose No results for input(s): GLUCAP in the last 168 hours.  CXR: NACPD    ASSESSMENT / PLAN:  PULMONARY A: Airway protection compromised due to AMS P:   Supplemental O2 as needed DNI   CARDIOVASCULAR A:  Shock, likely septic P:  Monitor CVP. Goal 8-12 Norepi ordered to maintain MAP > 65 mmHg  RENAL A:  CKD, stage III AKI - HRS vs sepsis related ATN P:   Monitor BMET intermittently Monitor I/Os Correct electrolytes as indicated CRRT planned by Renal service  Due to poor prognosis, would make this a limited trial of 48 - 72 hrs at  most  GASTROINTESTINAL A:  Cirrhosis Ascites Chronic PPI use Dysphagia due to AMS P:   SUP: IV PPI NPO until cognition improves Might need to consider NGT placement for medication administration 9/01  HEMATOLOGIC A:   Mild anemia without acute blood loss Mild hepatic coagulopathy P:  DVT px: SQ heparin Monitor CBC intermittently Transfuse per usual ICU guidelines  INFECTIOUS A:  Severe sepsis - likely SBP P:   Monitor temp, WBC count Micro and abx as above Paracentesis ordered by primary team There is no need to double cover for anaerobes. I have DC'd metronidazole  ENDOCRINE A:   DM2 Risk of hypoglycemia (cirrhosis, AKI, NPO) P:   I have changed SSI to q 4 hrs - sens scale  NEUROLOGIC A:  AMS, acute hypoactive encephalopathy - likely hepatic encephalopathy Very poor baseline functional status P:   RASS goal: 0 Minimize sedatives, opioids Agree with DNR status Agree with Palliative care input  FAMILY  Updates: I discussed the above impression and plan with family including my recommendations re: time limited trial of CRRT  CCM time: 56 The above time includes time spent in consultation with patient and/or family members and reviewing care plan on multidisciplinary rounds  Merton Border, MD PCCM service Mobile [phone] Pager [phone]

## 2015-06-02 NOTE — Progress Notes (Signed)
   07/01/15 1632  Clinical Encounter Type  Visited With Patient;Patient and family together;Health care provider  Visit Type Initial;Spiritual support  Referral From Physician  Consult/Referral To Chaplain  Spiritual Encounters  Spiritual Needs Emotional;Grief support  Stress Factors  Patient Stress Factors Health changes  Family Stress Factors Health changes  Chaplains were walking in the hallway and was speaking with the family and inquired with the doctor if I (we) were needed. Offered a compassionate presence, family support and comfort as applicable. Chaplain Aishani Kalis A. [place] Olkowski Ext. [phone]

## 2015-06-02 NOTE — Progress Notes (Signed)
   06-10-2015 1030  Clinical Encounter Type  Visited With Patient and family together  Visit Type Initial  Spiritual Encounters  Spiritual Needs Emotional;Prayer  Stress Factors  Family Stress Factors Health changes  Provided pastoral support, presence and prayer to patient and family.  [place] [phone]

## 2015-06-02 DEATH — deceased

## 2015-06-04 LAB — CULTURE, BLOOD (ROUTINE X 2)

## 2015-06-04 LAB — URINE CULTURE: SPECIAL REQUESTS: NORMAL

## 2015-06-05 LAB — CULTURE, BLOOD (ROUTINE X 2): Culture: NO GROWTH

## 2015-07-03 ENCOUNTER — Encounter: Payer: Self-pay | Admitting: Infectious Diseases

## 2015-10-02 IMAGING — CT CT HEAD W/O CM
3 series · 15 of 30 positions shown, 17 images · non-contrast
Comparison: None.

CLINICAL DATA: Altered mental status

EXAM:
CT HEAD WITHOUT CONTRAST
TECHNIQUE: Contiguous axial images were obtained from the base of the skull
through the vertex without intravenous contrast.

[Series 2: head wo · axial · 0.40mm/px · z∈[-38,+42]mm · 5 of 26 slices shown, 7 images]
[im 5/26  brain]
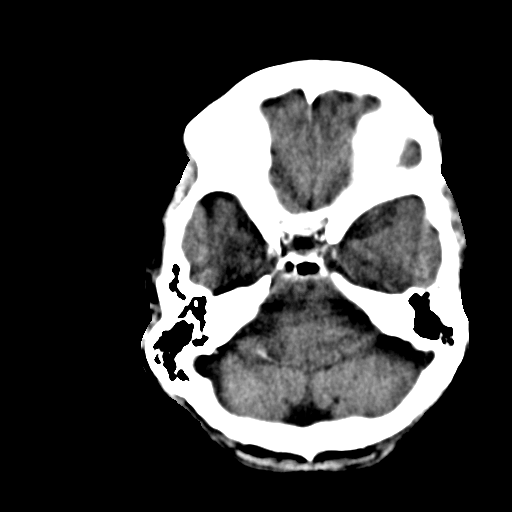
[im 5/26  bone]
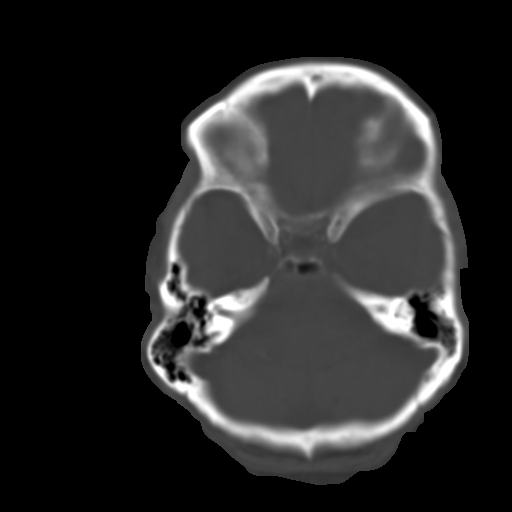
[im 9/26  brain]
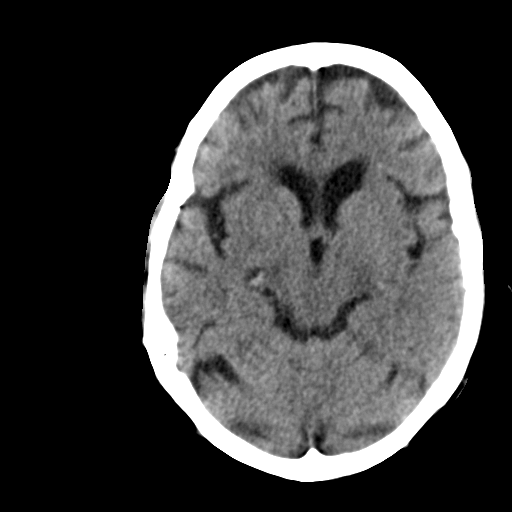
[im 13/26  brain]
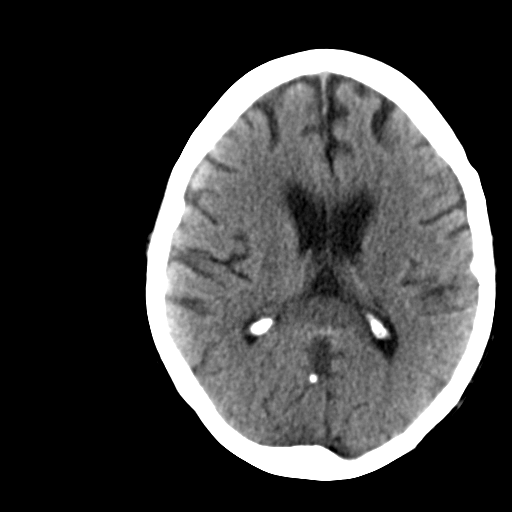
[im 17/26  brain]
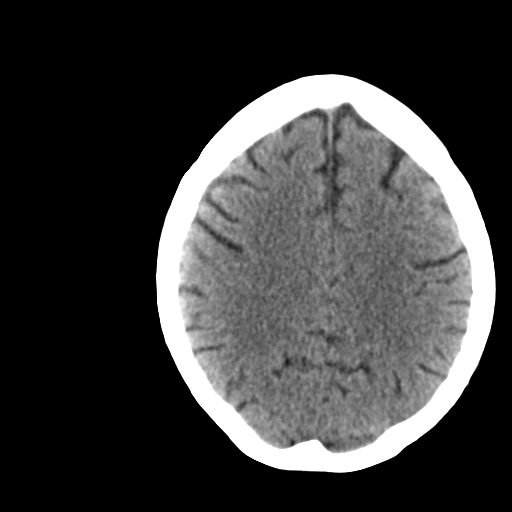
[im 21/26  brain]
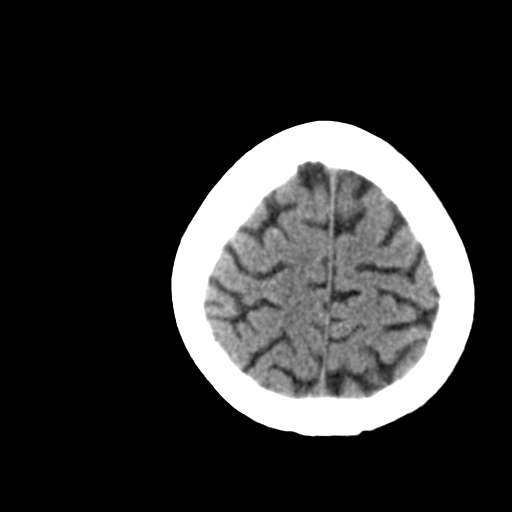
[im 21/26  bone]
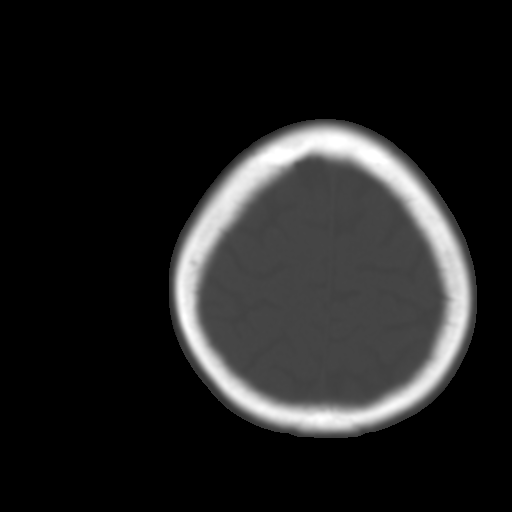

[Series 4: head wo recon · axial · 0.32mm/px · z∈[-29,+41]mm · 4 of 24 slices shown]
[im 5/24  brain]
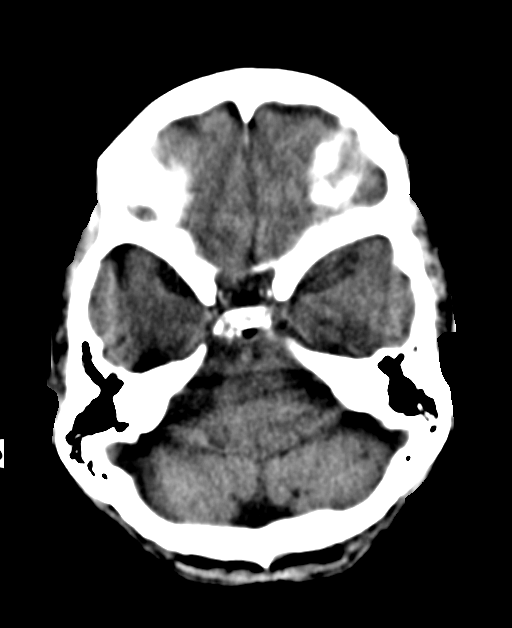
[im 10/24  brain]
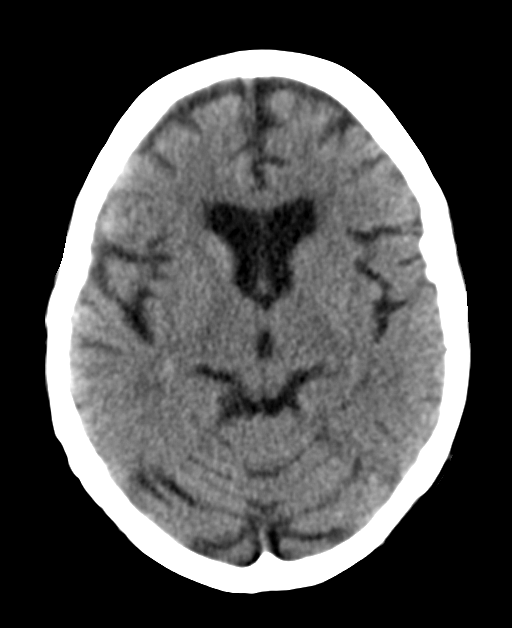
[im 14/24  brain]
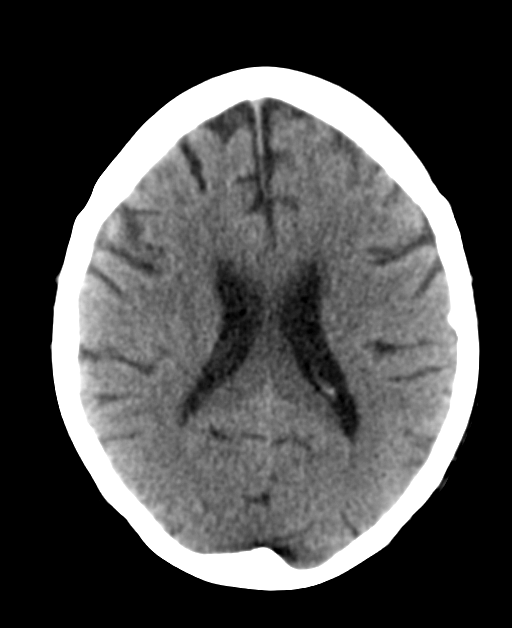
[im 19/24  brain]
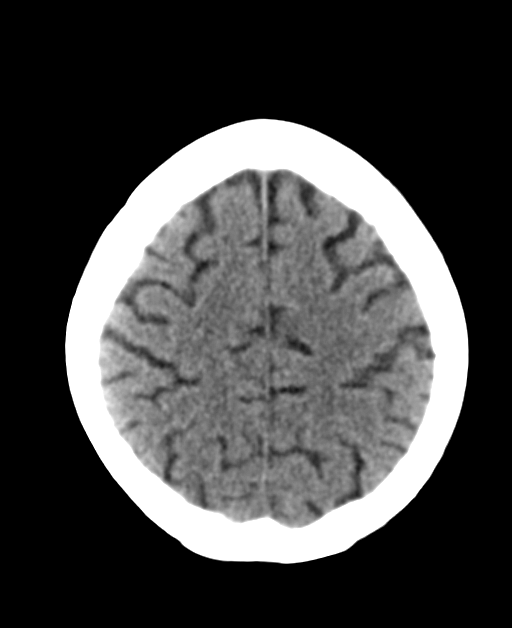

[Series 5: head bone recon · axial · 0.31mm/px · z∈[-38,+38]mm · 6 of 62 slices shown]
[im 4/62  bone]
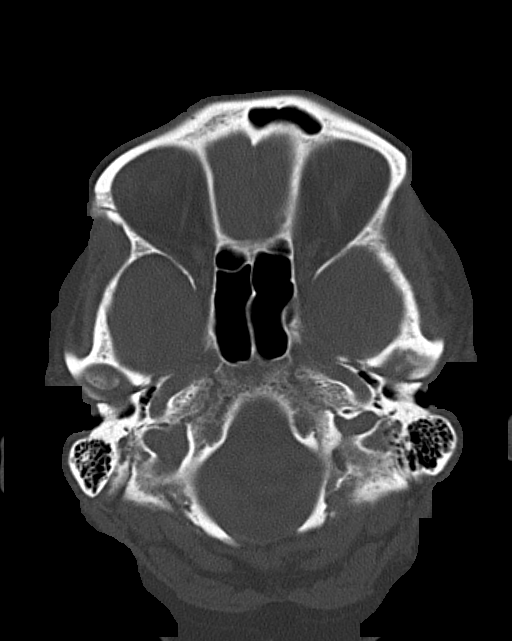
[im 12/62  bone]
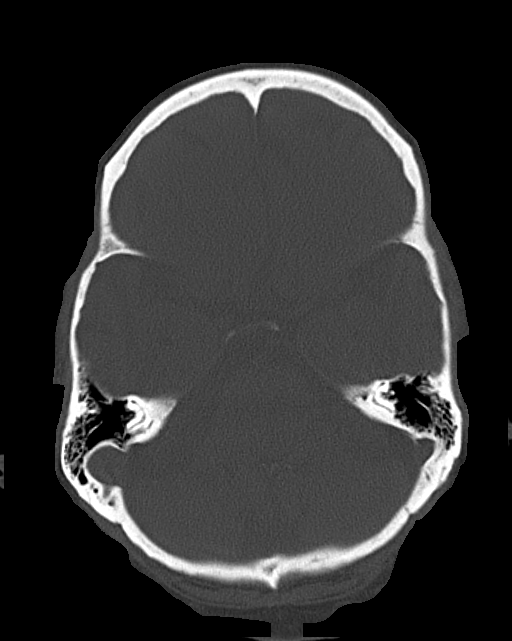
[im 20/62  bone]
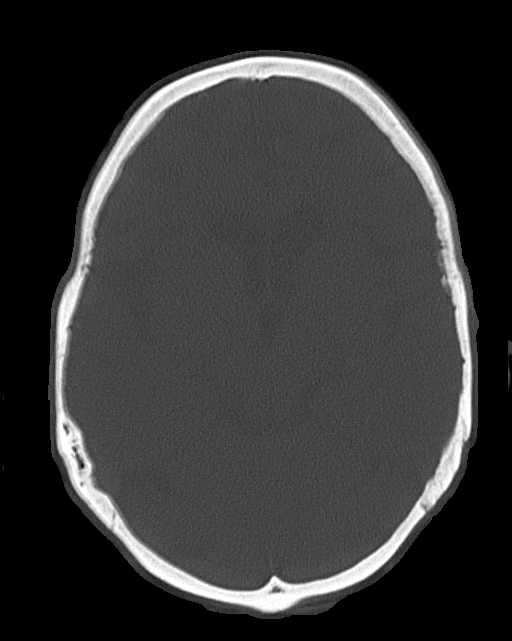
[im 27/62  bone]
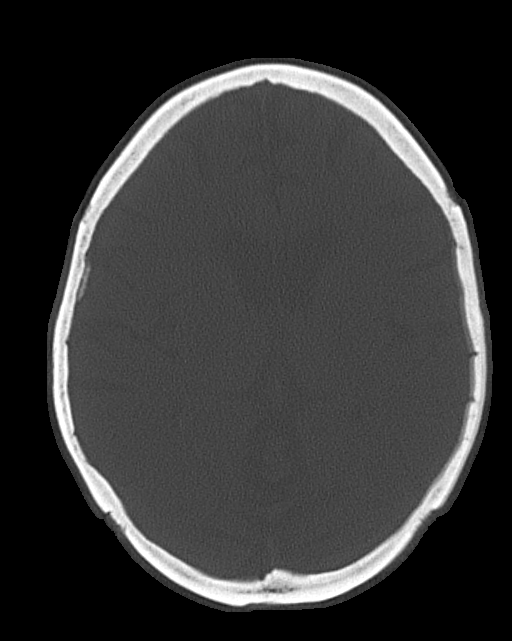
[im 35/62  bone]
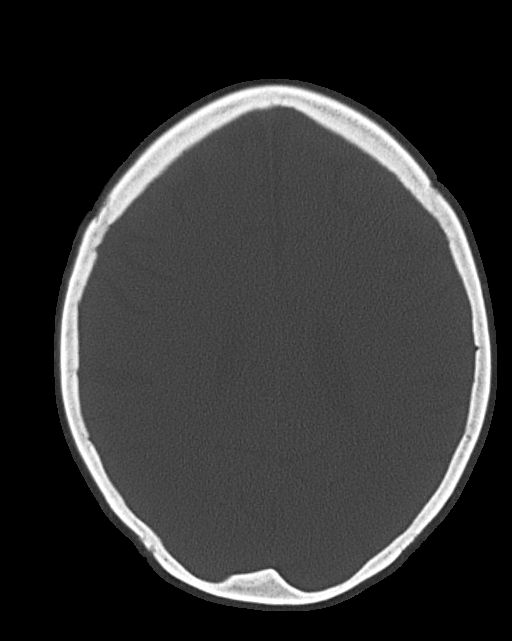
[im 42/62  bone]
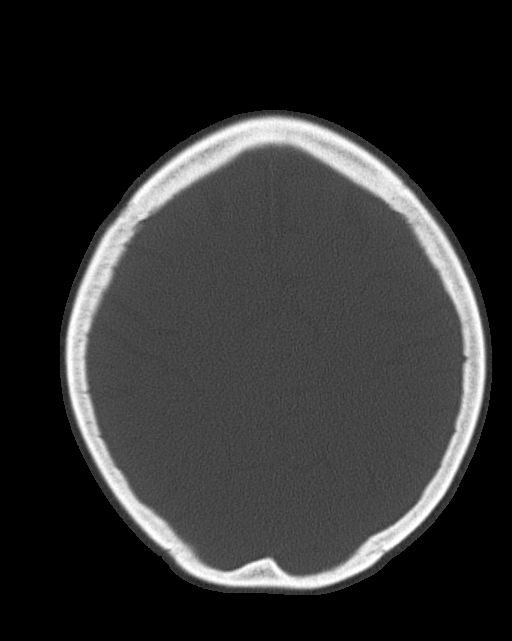

[15 of 30 positions shown; findings below may reference images not displayed]

FINDINGS: Skull and Sinuses:Negative for fracture or destructive process. The
mastoids, middle ears, and imaged paranasal sinuses are clear.

Orbits: Right cataract resection at least.  No acute finding.

Brain: No evidence of acute infarction, hemorrhage, hydrocephalus,
or mass lesion/mass effect.

Generalized cerebral volume loss and mild periventricular ischemic
gliosis, expected for age.
IMPRESSION: Unremarkable head CT for age.

## 2017-06-26 NOTE — Telephone Encounter (Signed)
Referral
